# Patient Record
Sex: Female | Born: 1975 | Race: White | Hispanic: No | Smoking: Current every day smoker
Health system: Southern US, Community
[De-identification: ages and names within clinical notes are randomized; demographics above are authoritative.]

## PROBLEM LIST (undated history)

## (undated) ENCOUNTER — Emergency Department (HOSPITAL_COMMUNITY): Admission: EM | Payer: Medicaid Other | Source: Home / Self Care

## (undated) DIAGNOSIS — N200 Calculus of kidney: Secondary | ICD-10-CM

## (undated) DIAGNOSIS — R6882 Decreased libido: Secondary | ICD-10-CM

## (undated) DIAGNOSIS — R058 Other specified cough: Secondary | ICD-10-CM

## (undated) DIAGNOSIS — E785 Hyperlipidemia, unspecified: Secondary | ICD-10-CM

## (undated) DIAGNOSIS — N289 Disorder of kidney and ureter, unspecified: Secondary | ICD-10-CM

## (undated) DIAGNOSIS — M1990 Unspecified osteoarthritis, unspecified site: Secondary | ICD-10-CM

## (undated) DIAGNOSIS — J189 Pneumonia, unspecified organism: Secondary | ICD-10-CM

## (undated) DIAGNOSIS — R232 Flushing: Secondary | ICD-10-CM

## (undated) DIAGNOSIS — F419 Anxiety disorder, unspecified: Secondary | ICD-10-CM

## (undated) DIAGNOSIS — R05 Cough: Secondary | ICD-10-CM

## (undated) DIAGNOSIS — J45909 Unspecified asthma, uncomplicated: Secondary | ICD-10-CM

## (undated) DIAGNOSIS — E119 Type 2 diabetes mellitus without complications: Secondary | ICD-10-CM

## (undated) DIAGNOSIS — Z87442 Personal history of urinary calculi: Secondary | ICD-10-CM

## (undated) DIAGNOSIS — M797 Fibromyalgia: Secondary | ICD-10-CM

## (undated) DIAGNOSIS — K219 Gastro-esophageal reflux disease without esophagitis: Secondary | ICD-10-CM

## (undated) DIAGNOSIS — J449 Chronic obstructive pulmonary disease, unspecified: Secondary | ICD-10-CM

## (undated) DIAGNOSIS — I499 Cardiac arrhythmia, unspecified: Secondary | ICD-10-CM

## (undated) DIAGNOSIS — F32A Depression, unspecified: Secondary | ICD-10-CM

## (undated) DIAGNOSIS — E236 Other disorders of pituitary gland: Secondary | ICD-10-CM

## (undated) DIAGNOSIS — G5603 Carpal tunnel syndrome, bilateral upper limbs: Secondary | ICD-10-CM

## (undated) DIAGNOSIS — D649 Anemia, unspecified: Secondary | ICD-10-CM

## (undated) DIAGNOSIS — E559 Vitamin D deficiency, unspecified: Secondary | ICD-10-CM

## (undated) HISTORY — DX: Flushing: R23.2

## (undated) HISTORY — DX: Other disorders of pituitary gland: E23.6

## (undated) HISTORY — DX: Type 2 diabetes mellitus without complications: E11.9

## (undated) HISTORY — DX: Unspecified osteoarthritis, unspecified site: M19.90

## (undated) HISTORY — DX: Vitamin D deficiency, unspecified: E55.9

## (undated) HISTORY — DX: Carpal tunnel syndrome, bilateral upper limbs: G56.03

## (undated) HISTORY — PX: BREAST CYST EXCISION: SHX579

## (undated) HISTORY — DX: Hyperlipidemia, unspecified: E78.5

## (undated) HISTORY — PX: TONSILLECTOMY: SUR1361

## (undated) HISTORY — PX: ABDOMINAL HYSTERECTOMY: SHX81

## (undated) HISTORY — DX: Decreased libido: R68.82

## (undated) HISTORY — PX: CHOLECYSTECTOMY: SHX55

## (undated) HISTORY — PX: OTHER SURGICAL HISTORY: SHX169

---

## 2000-09-18 HISTORY — PX: LAPAROSCOPIC CHOLECYSTECTOMY: SUR755

## 2001-05-08 ENCOUNTER — Ambulatory Visit (HOSPITAL_COMMUNITY): Admission: RE | Admit: 2001-05-08 | Discharge: 2001-05-08 | Payer: Self-pay | Admitting: Family Medicine

## 2001-05-08 ENCOUNTER — Encounter: Payer: Self-pay | Admitting: Family Medicine

## 2002-06-03 ENCOUNTER — Encounter: Payer: Self-pay | Admitting: Family Medicine

## 2002-06-03 ENCOUNTER — Ambulatory Visit (HOSPITAL_COMMUNITY): Admission: RE | Admit: 2002-06-03 | Discharge: 2002-06-03 | Payer: Self-pay | Admitting: Family Medicine

## 2002-11-25 ENCOUNTER — Ambulatory Visit (HOSPITAL_COMMUNITY): Admission: RE | Admit: 2002-11-25 | Discharge: 2002-11-25 | Payer: Self-pay | Admitting: Family Medicine

## 2002-11-25 ENCOUNTER — Encounter: Payer: Self-pay | Admitting: Family Medicine

## 2004-04-14 ENCOUNTER — Ambulatory Visit (HOSPITAL_COMMUNITY): Admission: RE | Admit: 2004-04-14 | Discharge: 2004-04-14 | Payer: Self-pay | Admitting: *Deleted

## 2004-06-02 ENCOUNTER — Ambulatory Visit (HOSPITAL_COMMUNITY): Admission: RE | Admit: 2004-06-02 | Discharge: 2004-06-02 | Payer: Self-pay | Admitting: *Deleted

## 2005-01-06 ENCOUNTER — Ambulatory Visit: Payer: Self-pay | Admitting: *Deleted

## 2005-05-18 ENCOUNTER — Emergency Department (HOSPITAL_COMMUNITY): Admission: EM | Admit: 2005-05-18 | Discharge: 2005-05-19 | Payer: Self-pay | Admitting: *Deleted

## 2006-06-21 ENCOUNTER — Emergency Department (HOSPITAL_COMMUNITY): Admission: EM | Admit: 2006-06-21 | Discharge: 2006-06-21 | Payer: Self-pay | Admitting: Emergency Medicine

## 2007-01-05 ENCOUNTER — Emergency Department (HOSPITAL_COMMUNITY): Admission: EM | Admit: 2007-01-05 | Discharge: 2007-01-05 | Payer: Self-pay | Admitting: Emergency Medicine

## 2007-05-02 ENCOUNTER — Ambulatory Visit (HOSPITAL_COMMUNITY): Admission: RE | Admit: 2007-05-02 | Discharge: 2007-05-02 | Payer: Self-pay | Admitting: Family Medicine

## 2007-09-23 ENCOUNTER — Emergency Department (HOSPITAL_COMMUNITY): Admission: EM | Admit: 2007-09-23 | Discharge: 2007-09-23 | Payer: Self-pay | Admitting: Emergency Medicine

## 2007-12-23 ENCOUNTER — Other Ambulatory Visit: Admission: RE | Admit: 2007-12-23 | Discharge: 2007-12-23 | Payer: Self-pay | Admitting: Obstetrics and Gynecology

## 2007-12-25 ENCOUNTER — Ambulatory Visit (HOSPITAL_COMMUNITY): Admission: RE | Admit: 2007-12-25 | Discharge: 2007-12-25 | Payer: Self-pay | Admitting: Obstetrics and Gynecology

## 2008-01-23 ENCOUNTER — Ambulatory Visit (HOSPITAL_COMMUNITY): Admission: RE | Admit: 2008-01-23 | Discharge: 2008-01-23 | Payer: Self-pay | Admitting: Obstetrics & Gynecology

## 2008-04-18 ENCOUNTER — Emergency Department (HOSPITAL_COMMUNITY): Admission: EM | Admit: 2008-04-18 | Discharge: 2008-04-18 | Payer: Self-pay | Admitting: Emergency Medicine

## 2008-05-17 ENCOUNTER — Emergency Department (HOSPITAL_COMMUNITY): Admission: EM | Admit: 2008-05-17 | Discharge: 2008-05-17 | Payer: Self-pay | Admitting: Emergency Medicine

## 2008-07-09 ENCOUNTER — Emergency Department (HOSPITAL_COMMUNITY): Admission: EM | Admit: 2008-07-09 | Discharge: 2008-07-09 | Payer: Self-pay | Admitting: Emergency Medicine

## 2008-12-26 ENCOUNTER — Emergency Department (HOSPITAL_COMMUNITY): Admission: EM | Admit: 2008-12-26 | Discharge: 2008-12-26 | Payer: Self-pay | Admitting: Emergency Medicine

## 2009-02-17 ENCOUNTER — Other Ambulatory Visit: Admission: RE | Admit: 2009-02-17 | Discharge: 2009-02-17 | Payer: Self-pay | Admitting: Obstetrics and Gynecology

## 2009-02-18 ENCOUNTER — Ambulatory Visit (HOSPITAL_COMMUNITY): Admission: RE | Admit: 2009-02-18 | Discharge: 2009-02-18 | Payer: Self-pay | Admitting: Obstetrics & Gynecology

## 2009-03-10 ENCOUNTER — Encounter (INDEPENDENT_AMBULATORY_CARE_PROVIDER_SITE_OTHER): Payer: Self-pay | Admitting: Diagnostic Radiology

## 2009-03-10 ENCOUNTER — Ambulatory Visit (HOSPITAL_COMMUNITY): Admission: RE | Admit: 2009-03-10 | Discharge: 2009-03-10 | Payer: Self-pay | Admitting: Obstetrics & Gynecology

## 2009-06-12 ENCOUNTER — Emergency Department (HOSPITAL_COMMUNITY): Admission: EM | Admit: 2009-06-12 | Discharge: 2009-06-12 | Payer: Self-pay | Admitting: Emergency Medicine

## 2010-01-25 ENCOUNTER — Emergency Department (HOSPITAL_COMMUNITY): Admission: EM | Admit: 2010-01-25 | Discharge: 2010-01-25 | Payer: Self-pay | Admitting: Emergency Medicine

## 2010-03-21 ENCOUNTER — Emergency Department (HOSPITAL_COMMUNITY): Admission: EM | Admit: 2010-03-21 | Discharge: 2010-03-22 | Payer: Self-pay | Admitting: Emergency Medicine

## 2010-05-23 ENCOUNTER — Emergency Department (HOSPITAL_COMMUNITY): Admission: EM | Admit: 2010-05-23 | Discharge: 2010-05-23 | Payer: Self-pay | Admitting: Emergency Medicine

## 2010-06-27 ENCOUNTER — Ambulatory Visit (HOSPITAL_COMMUNITY): Admission: RE | Admit: 2010-06-27 | Discharge: 2010-06-27 | Payer: Self-pay | Admitting: Family Medicine

## 2010-07-07 ENCOUNTER — Encounter (HOSPITAL_COMMUNITY): Admission: RE | Admit: 2010-07-07 | Discharge: 2010-07-08 | Payer: Self-pay | Admitting: Family Medicine

## 2010-08-08 ENCOUNTER — Encounter (INDEPENDENT_AMBULATORY_CARE_PROVIDER_SITE_OTHER): Payer: Self-pay | Admitting: General Surgery

## 2010-08-08 ENCOUNTER — Inpatient Hospital Stay (HOSPITAL_COMMUNITY): Admission: RE | Admit: 2010-08-08 | Discharge: 2010-08-09 | Payer: Self-pay | Admitting: General Surgery

## 2010-09-18 HISTORY — PX: THYROIDECTOMY, PARTIAL: SHX18

## 2010-10-10 ENCOUNTER — Encounter: Payer: Self-pay | Admitting: Obstetrics & Gynecology

## 2010-11-25 NOTE — Discharge Summary (Signed)
  NAME:  Cassandra Lucas, Cassandra Lucas NO.:  [id number]  MEDICAL RECORD NO.:  [id number]          PATIENT TYPE:  INP  LOCATION:  A302                          FACILITY:  [facility]  PHYSICIAN:  Tilford Pillar, MD      DATE OF BIRTH:  [birth year]  DATE OF ADMISSION:  08/08/2010 DATE OF DISCHARGE:  11/22/2011LH                              DISCHARGE SUMMARY   ADMISSION DIAGNOSES:  Thyroid cystic mass and compressing symptomatology.  DISCHARGE DIAGNOSIS:  Status post thyroid lobectomy.  DISPOSITION:  Home.  BRIEF HISTORY AND PHYSICAL:  Please see admission history and physical for the complete H and P.  The patient is a 35 year old female who presents to my office with increasing discomfort and compressive symptomatology from a growing thyroid lesion.  She had an ultrasound which did demonstrate a cystic structure within this, although less suspicion of the malignant process.  It was advised that she undergo thyroid lobectomy due to her compressive symptomatology.  Risks, benefits and alternatives were discussed with the patient.  She was admitted for the planned procedure and management.  Also of course, the patient was admitted on August 08, 2010.  She was taken to the operating room on the same day.  She tolerated the procedure extremely well, spent a brief period in the Postanesthetic Care Unit and then was transferred back to regular hospital floor.  She was monitored closely. On the morning, her symptoms were improved.  She was tolerating oral analgesia and the patient was discharged to home on August 09, 2010.  DISCHARGE INSTRUCTIONS:  She was instructed to increase activity as tolerated.  She had resumed a normal diet.  She may shower.  She is postponing the decisions for the next 2-3 weeks.  She is to call my office should she have any questions or concerns.  She is to follow up in my office in 2 weeks.  DISCHARGE MEDICATIONS:  Please see the discharge  medication reconciliation sheet for all discharge medications.     Tilford Pillar, MD     BZ/MEDQ  D:  09/21/2010  T:  09/22/2010  Job:  [id number]  Electronically Signed by Tilford Pillar MD on 11/24/2010 08:52:15 PM

## 2010-11-29 LAB — BASIC METABOLIC PANEL WITH GFR
BUN: 11 mg/dL (ref 6–23)
CO2: 26 meq/L (ref 19–32)
Calcium: 9.1 mg/dL (ref 8.4–10.5)
Chloride: 105 meq/L (ref 96–112)
Creatinine, Ser: 0.65 mg/dL (ref 0.4–1.2)
GFR calc non Af Amer: >60 mL/min
Glucose, Bld: 86 mg/dL (ref 70–99)
Potassium: 4.3 meq/L (ref 3.5–5.1)
Sodium: 137 meq/L (ref 135–145)

## 2010-11-29 LAB — CBC
Hemoglobin: 13 g/dL (ref 12.0–15.0)
Hemoglobin: 14.3 g/dL (ref 12.0–15.0)
MCH: 31.8 pg (ref 26.0–34.0)
MCH: 32.1 pg (ref 26.0–34.0)
MCV: 91.6 fL (ref 78.0–100.0)
MCV: 92.4 fL (ref 78.0–100.0)
RBC: 4.05 MIL/uL (ref 3.87–5.11)
RBC: 4.51 MIL/uL (ref 3.87–5.11)

## 2010-11-29 LAB — BASIC METABOLIC PANEL
CO2: 27 mEq/L (ref 19–32)
Chloride: 106 mEq/L (ref 96–112)
GFR calc Af Amer: >60 mL/min (ref >60)
Sodium: 139 mEq/L (ref 135–145)

## 2010-11-29 LAB — DIFFERENTIAL
Eosinophils Absolute: 0.1 10*3/uL (ref 0.0–0.7)
Eosinophils Relative: 1 % (ref 0–5)
Lymphs Abs: 3.1 10*3/uL (ref 0.7–4.0)
Monocytes Relative: 8 % (ref 3–12)
Neutrophils Relative %: 70 % (ref 43–77)

## 2010-12-01 LAB — URINE MICROSCOPIC-ADD ON

## 2010-12-01 LAB — URINALYSIS, ROUTINE W REFLEX MICROSCOPIC: Glucose, UA: NEGATIVE mg/dL

## 2010-12-01 LAB — PREGNANCY, URINE: Preg Test, Ur: NEGATIVE

## 2010-12-28 LAB — URINALYSIS, ROUTINE W REFLEX MICROSCOPIC
Bilirubin Urine: NEGATIVE
Ketones, ur: NEGATIVE mg/dL
Nitrite: NEGATIVE
Protein, ur: NEGATIVE mg/dL
Specific Gravity, Urine: 1.025 (ref 1.005–1.030)
Urobilinogen, UA: 0.2 mg/dL (ref 0.0–1.0)

## 2010-12-28 LAB — URINE MICROSCOPIC-ADD ON

## 2011-03-08 ENCOUNTER — Emergency Department (HOSPITAL_COMMUNITY): Payer: Medicaid Other

## 2011-03-08 ENCOUNTER — Emergency Department (HOSPITAL_COMMUNITY)
Admission: EM | Admit: 2011-03-08 | Discharge: 2011-03-08 | Disposition: A | Discharge status: Home / Self Care | Payer: Medicaid Other | Attending: Emergency Medicine | Admitting: Emergency Medicine

## 2011-03-08 DIAGNOSIS — IMO0002 Reserved for concepts with insufficient information to code with codable children: Secondary | ICD-10-CM | POA: Insufficient documentation

## 2011-03-08 DIAGNOSIS — Y92009 Unspecified place in unspecified non-institutional (private) residence as the place of occurrence of the external cause: Secondary | ICD-10-CM | POA: Insufficient documentation

## 2011-03-08 DIAGNOSIS — S139XXA Sprain of joints and ligaments of unspecified parts of neck, initial encounter: Secondary | ICD-10-CM | POA: Insufficient documentation

## 2011-03-23 ENCOUNTER — Inpatient Hospital Stay (HOSPITAL_COMMUNITY): Admission: RE | Admit: 2011-03-23 | Payer: Self-pay | Source: Ambulatory Visit

## 2011-03-23 ENCOUNTER — Other Ambulatory Visit: Payer: Self-pay | Admitting: Obstetrics & Gynecology

## 2011-03-24 ENCOUNTER — Encounter (HOSPITAL_COMMUNITY): Payer: Self-pay

## 2011-03-24 ENCOUNTER — Encounter (HOSPITAL_COMMUNITY): Admission: RE | Admit: 2011-03-24 | Payer: Self-pay | Source: Home / Self Care | Admitting: Obstetrics & Gynecology

## 2011-03-24 ENCOUNTER — Ambulatory Visit (HOSPITAL_COMMUNITY)
Admission: RE | Admit: 2011-03-24 | Discharge: 2011-03-24 | Disposition: A | Discharge status: Home / Self Care | Payer: Medicaid Other | Source: Ambulatory Visit | Attending: Obstetrics & Gynecology | Admitting: Obstetrics & Gynecology

## 2011-03-24 DIAGNOSIS — R52 Pain, unspecified: Secondary | ICD-10-CM

## 2011-03-24 DIAGNOSIS — R109 Unspecified abdominal pain: Secondary | ICD-10-CM | POA: Insufficient documentation

## 2011-03-24 DIAGNOSIS — N920 Excessive and frequent menstruation with regular cycle: Secondary | ICD-10-CM | POA: Insufficient documentation

## 2011-03-24 DIAGNOSIS — Z5309 Procedure and treatment not carried out because of other contraindication: Secondary | ICD-10-CM | POA: Insufficient documentation

## 2011-03-24 DIAGNOSIS — Z01812 Encounter for preprocedural laboratory examination: Secondary | ICD-10-CM | POA: Insufficient documentation

## 2011-03-24 DIAGNOSIS — N200 Calculus of kidney: Secondary | ICD-10-CM

## 2011-03-24 LAB — URINE MICROSCOPIC-ADD ON

## 2011-03-24 LAB — URINALYSIS, ROUTINE W REFLEX MICROSCOPIC
Ketones, ur: NEGATIVE mg/dL
Leukocytes, UA: NEGATIVE
Nitrite: NEGATIVE
Protein, ur: NEGATIVE mg/dL

## 2011-03-24 LAB — CBC
HCT: 42 % (ref 36.0–46.0)
Hemoglobin: 14.4 g/dL (ref 12.0–15.0)
MCV: 91.9 fL (ref 78.0–100.0)
RBC: 4.57 MIL/uL (ref 3.87–5.11)
WBC: 9.6 10*3/uL (ref 4.0–10.5)

## 2011-03-24 LAB — BASIC METABOLIC PANEL
CO2: 25 mEq/L (ref 19–32)
Chloride: 105 mEq/L (ref 96–112)
Creatinine, Ser: 0.96 mg/dL (ref 0.50–1.10)
Sodium: 140 mEq/L (ref 135–145)

## 2011-03-24 LAB — TYPE AND SCREEN

## 2011-03-24 NOTE — Patient Instructions (Signed)
20 Cassandra Lucas  03/24/2011   Your procedure is scheduled on:  Wednesday, 03/29/11  Report to [facility] at 0900 AM.  Call this number if you have problems the morning of surgery: [phone]   Remember:   Do not eat food:After Midnight.  Do not drink clear liquids: After Midnight.  Take these medicines the morning of surgery with A SIP OF WATER: nothing   Do not wear jewelry, make-up or nail polish.  Do not bring valuables to the hospital.  Contacts, dentures or bridgework may not be worn into surgery.  Leave suitcase in the car. After surgery it may be brought to your room.  For patients admitted to the hospital, checkout time is 11:00 AM the day of discharge.   Patients discharged the day of surgery will not be allowed to drive home.  Name and phone number of your driver: grandma  Special Instructions: CHG Shower Shower 2 days before surgery and 1 day before surgery with Hibiclens.   Please read over the following fact sheets that you were given: Pain Booklet, Coughing and Deep Breathing, MRSA Information, Surgical Site Infection Prevention and Anesthesia Post-op Instructions   Instructions Following General Anesthetic, Adult A nurse specialized in giving anesthesia (anesthetist) or a doctor specialized in giving anesthesia (anesthesiologist) gave you a medicine that made you sleep while a procedure was performed. For as long as 24 hours following this procedure, you may feel:  Dizzy.   Weak.   Drowsy.  AFTER YOUR SURGERY 1. After surgery, you will be taken to the recovery area where a nurse will monitor your progress. You will be allowed to go home when you are awake, stable, taking fluids well, and without complications.  2.  3. For the first 24 hours following an anesthetic:   Have a responsible person with you.   Do not drive a car. If you are alone, do not take public transportation.   Do not drink alcohol.   Do not take medicine that has not been prescribed by  your caregiver.   Do not sign important papers or make important decisions.   You may resume normal diet and activities as directed.   Change bandages (dressings) as directed.   Only take over-the-counter or prescription medicines for pain, discomfort, or fever as directed by your caregiver.  If you have questions or problems that seem related to the anesthetic, call the hospital and ask for the anesthetist or anesthesiologist on call. SEEK IMMEDIATE MEDICAL CARE IF:  You develop a rash.   You have difficulty breathing.   You have chest pain.   You develop any allergic problems.  Document Released: 12/11/2000 Document Re-Released: 11/29/2009 [facility] Patient Information 2011 [place], [place].

## 2011-03-29 ENCOUNTER — Other Ambulatory Visit: Payer: Self-pay | Admitting: Obstetrics & Gynecology

## 2011-03-29 ENCOUNTER — Ambulatory Visit (HOSPITAL_COMMUNITY)
Admission: RE | Admit: 2011-03-29 | Discharge: 2011-03-29 | Disposition: A | Discharge status: Home / Self Care | Payer: Medicaid Other | Source: Ambulatory Visit | Attending: Obstetrics & Gynecology | Admitting: Obstetrics & Gynecology

## 2011-03-29 DIAGNOSIS — N200 Calculus of kidney: Secondary | ICD-10-CM

## 2011-03-29 DIAGNOSIS — R9389 Abnormal findings on diagnostic imaging of other specified body structures: Secondary | ICD-10-CM | POA: Insufficient documentation

## 2011-03-29 DIAGNOSIS — R1031 Right lower quadrant pain: Secondary | ICD-10-CM | POA: Insufficient documentation

## 2011-03-29 DIAGNOSIS — R52 Pain, unspecified: Secondary | ICD-10-CM

## 2011-03-29 HISTORY — DX: Other specified cough: R05.8

## 2011-03-29 HISTORY — DX: Cough: R05

## 2011-03-29 HISTORY — DX: Anxiety disorder, unspecified: F41.9

## 2011-04-05 ENCOUNTER — Other Ambulatory Visit: Payer: Self-pay | Admitting: Obstetrics & Gynecology

## 2011-04-11 ENCOUNTER — Encounter (HOSPITAL_COMMUNITY): Payer: Self-pay | Admitting: Obstetrics & Gynecology

## 2011-04-11 DIAGNOSIS — N200 Calculus of kidney: Secondary | ICD-10-CM | POA: Insufficient documentation

## 2011-04-11 DIAGNOSIS — IMO0001 Reserved for inherently not codable concepts without codable children: Secondary | ICD-10-CM | POA: Insufficient documentation

## 2011-04-11 DIAGNOSIS — N921 Excessive and frequent menstruation with irregular cycle: Secondary | ICD-10-CM | POA: Insufficient documentation

## 2011-04-11 NOTE — H&P (Signed)
Preoperative History and Physical  Cassandra Lucas is a 35 y.o. G4P0004 with No LMP recorded. admitted for a laparoscopic total hysterectomy.  The patient has been seen now for greater than 3 years by our practice and her symptoms predated her visits in 2009.  The patient has had consistent problem with bump dyspareunia, menometrorrhagia, and dysmenorrhea despite management with OCP and Megestrol.  She has no lateral pain at all, 100% is in the midline.  Her periods last 8-9 days with clots and heavy cramping which significantly disrupts the patients life.  Again her pain with intercourse is all midline.  I performed an ultrasound in the office which reveals adenomyosis in the uterus, normal ovaries. After trying conservative options for the past 3 years we have decided to proceed with laparoscopic total hysterectomy and preserve the ovaries if no indication for removal.  The patient has had a recurrent bout with a renal/ureteral stone recently but has decided to proceed at this point with surgery.  PMH:    Past Medical History  Diagnosis Date  . Productive cough     smoker  . Anxiety     PSH:     Past Surgical History  Procedure Date  . Thyroidectomy, partial 2012    [facility], [facility]  . Laparoscopic cholecystectomy 2002    [facility]  . Breast cyst excision   . Cesarean section 2006    [facility]    POb/GynH:      OB History    Grav Para Term Preterm Abortions TAB SAB Ect Mult Living   4 4 0 0 0 0 0 0 0 4       SH:   History  Substance Use Topics  . Smoking status: Current Everyday Smoker -- 1.0 packs/day for 17 years    Types: Cigarettes  . Smokeless tobacco: Not on file  . Alcohol Use: No    FH:   History reviewed. No pertinent family history.   Allergies:  Allergies  Allergen Reactions  . Nsaids Shortness Of Breath  . Aspirin Other (See Comments)    unknown    Medications:      No current facility-administered medications for this encounter. Current outpatient  prescriptions:cyclobenzaprine (FLEXERIL) 10 MG tablet, Take 10 mg by mouth 3 (three) times daily as needed.  , Disp: , Rfl: ;  megestrol (MEGACE) 40 MG tablet, Take 40 mg by mouth daily.  , Disp: , Rfl:   Review of Systems: - History obtained from the patient General ROS: positive for  - left flank pain Respiratory ROS: no cough, shortness of breath, or wheezing Cardiovascular ROS: no chest pain or dyspnea on exertion Gastrointestinal ROS: no abdominal pain, change in bowel habits, or black or bloody stools Genito-Urinary ROS: positive for - dysmenorrhea, dyspareunia, irregular/heavy menses, pelvic pain(midline, no lateral pain) Musculoskeletal ROS: negative Neurological ROS: negative  PHYSICAL EXAM:  There were no vitals taken for this visit.  General: General appearance - alert, well appearing, and in no distress Neck - supple, no significant adenopathy Chest - clear to auscultation, no wheezes, rales or rhonchi, symmetric air entry Heart - normal rate, regular rhythm, normal S1, S2, no murmurs, rubs, clicks or gallops Abdomen - soft, nontender, nondistended, no masses or organomegaly Breasts - breasts appear normal, no suspicious masses, no skin or nipple changes or axillary nodes Back exam - full range of motion, palpable spasm or pain on motion, tenderness noted Left flank Focused Gynecological Exam: VULVA: normal appearing vulva with no masses, tenderness or lesions,  VAGINA: normal appearing vagina with normal color and discharge, no lesions, CERVIX: normal appearing cervix without discharge or lesions, there is pain with anterior or posterior motion of cervix, not as if infectious, but bump dyspareunia, UTERUS: uterus is normal size, shape, consistency and, soft with  tenderness on palpation consistent with adenomyosis, anteverted, ADNEXA: normal adnexa in size, nontender and no masses, no masses, RECTAL: normal rectal, no masses, no rectocele, PAP: Pap smear normal, 02/2009, thin-prep  method  Labs: No results found for this or any previous visit (from the past 336 hour(s)).  EKG: No orders found for this or any previous visit.  Imaging Studies: US Renal  03/29/2011  *RADIOLOGY REPORT*  Clinical Data: Right flank pain, history kidney stones  RENAL/URINARY TRACT ULTRASOUND COMPLETE  Comparison:  CT abdomen 05/17/2008  Findings:  Right Kidney:  10.4 cm length.  Minimal cortical thinning.  Normal cortical echogenicity.  No mass, hydronephrosis shadowing calcification.  Left Kidney:  11.8 cm length.  Normal cortical echogenicity. Minimal cortical thinning.  Several echogenic nonshadowing foci within left kidney, cannot completely exclude renal calculi.  No definite mass or hydronephrosis.  Bladder:  Only partially distended, grossly normal morphology. Left ureteral jet visualized.  Right ureteral jet is not identified.  IMPRESSION: Question nonobstructing left renal calculi as cause of the nonshadowing J foci at left kidney. No acute right renal abnormalities are sonographically identified, though a right ureteral jet was not visualized during imaging.  Original Report Authenticated By: Lollie Marrow, M.D.      Assessment: Patient Active Problem List  Diagnoses  . Kidney stones  . Menometrorrhagia  . Dyspareunia (not due to a general medical condition)  4.  Adenomyosis 5.. Exhaustion of conservative management  Plan: Laparoscopic total hysterectomy.  Pt understands the risks of surgery including but not limited t  excessive bleeding requiring transfusion or reoperation, post-operative infection requiring prolonged hospitalization or re-hospitalization and antibiotic therapy, and damage to other organs including bladder, bowel, ureters and major vessels.  Lazaro Arms 04/11/2011 9:13 PM

## 2011-04-12 ENCOUNTER — Other Ambulatory Visit: Payer: Self-pay | Admitting: Obstetrics & Gynecology

## 2011-04-12 ENCOUNTER — Ambulatory Visit (HOSPITAL_COMMUNITY): Payer: Medicaid Other | Admitting: Anesthesiology

## 2011-04-12 ENCOUNTER — Ambulatory Visit (HOSPITAL_COMMUNITY)
Admission: RE | Admit: 2011-04-12 | Discharge: 2011-04-12 | Disposition: A | Discharge status: Home / Self Care | Payer: Medicaid Other | Source: Ambulatory Visit | Attending: Obstetrics & Gynecology | Admitting: Obstetrics & Gynecology

## 2011-04-12 ENCOUNTER — Encounter (HOSPITAL_COMMUNITY): Payer: Self-pay | Admitting: Anesthesiology

## 2011-04-12 ENCOUNTER — Encounter (HOSPITAL_COMMUNITY)
Admission: RE | Disposition: A | Discharge status: Home / Self Care | Payer: Self-pay | Source: Ambulatory Visit | Attending: Obstetrics & Gynecology

## 2011-04-12 ENCOUNTER — Encounter (HOSPITAL_COMMUNITY): Payer: Self-pay | Admitting: *Deleted

## 2011-04-12 DIAGNOSIS — N920 Excessive and frequent menstruation with regular cycle: Secondary | ICD-10-CM | POA: Insufficient documentation

## 2011-04-12 DIAGNOSIS — Z01812 Encounter for preprocedural laboratory examination: Secondary | ICD-10-CM | POA: Insufficient documentation

## 2011-04-12 DIAGNOSIS — Z90710 Acquired absence of both cervix and uterus: Secondary | ICD-10-CM

## 2011-04-12 DIAGNOSIS — IMO0002 Reserved for concepts with insufficient information to code with codable children: Secondary | ICD-10-CM | POA: Insufficient documentation

## 2011-04-12 DIAGNOSIS — N8000 Endometriosis of the uterus, unspecified: Secondary | ICD-10-CM | POA: Insufficient documentation

## 2011-04-12 DIAGNOSIS — N946 Dysmenorrhea, unspecified: Secondary | ICD-10-CM | POA: Insufficient documentation

## 2011-04-12 HISTORY — DX: Anemia, unspecified: D64.9

## 2011-04-12 HISTORY — PX: LAPAROSCOPIC HYSTERECTOMY: SHX1926

## 2011-04-12 SURGERY — HYSTERECTOMY, TOTAL, LAPAROSCOPIC
Anesthesia: General | Wound class: Clean Contaminated

## 2011-04-12 MED ORDER — OXYCODONE-ACETAMINOPHEN 7.5-500 MG PO TABS: 1.0000 | ORAL_TABLET | Freq: Four times a day (QID) | ORAL | Status: AC | PRN

## 2011-04-12 MED ORDER — LACTATED RINGERS IV SOLN
INTRAVENOUS | Status: DC
  Administered 2011-04-12 (×3): via INTRAVENOUS
  Administered 2011-04-12: 1000 mL via INTRAVENOUS

## 2011-04-12 MED ORDER — ONDANSETRON HCL 4 MG/2ML IJ SOLN: 4.0000 mg | Freq: Once | INTRAMUSCULAR | Status: DC | PRN

## 2011-04-12 MED ORDER — ACETAMINOPHEN 10 MG/ML IV SOLN: 1000.0000 mg | INTRAVENOUS | Status: DC

## 2011-04-12 MED ORDER — ROCURONIUM BROMIDE 100 MG/10ML IV SOLN
INTRAVENOUS | Status: DC | PRN
  Administered 2011-04-12: 5 mg via INTRAVENOUS
  Administered 2011-04-12: 25 mg via INTRAVENOUS
  Administered 2011-04-12 (×2): 10 mg via INTRAVENOUS

## 2011-04-12 MED ORDER — MIDAZOLAM HCL 2 MG/2ML IJ SOLN
INTRAMUSCULAR | Status: AC
  Administered 2011-04-12: 2 mg via INTRAVENOUS
  Filled 2011-04-12: qty 2

## 2011-04-12 MED ORDER — ONDANSETRON HCL 4 MG/2ML IJ SOLN
INTRAMUSCULAR | Status: AC
  Administered 2011-04-12: 4 mg via INTRAVENOUS
  Filled 2011-04-12: qty 2

## 2011-04-12 MED ORDER — ONDANSETRON HCL 4 MG/2ML IJ SOLN
4.0000 mg | Freq: Once | INTRAMUSCULAR | Status: AC
  Administered 2011-04-12: 4 mg via INTRAVENOUS

## 2011-04-12 MED ORDER — FENTANYL CITRATE 0.05 MG/ML IJ SOLN
INTRAMUSCULAR | Status: AC
  Administered 2011-04-12: 50 ug via INTRAVENOUS
  Filled 2011-04-12: qty 2

## 2011-04-12 MED ORDER — HYDROMORPHONE HCL 1 MG/ML IJ SOLN: 0.2500 mg | INTRAMUSCULAR | Status: DC | PRN

## 2011-04-12 MED ORDER — NEOSTIGMINE METHYLSULFATE 1 MG/ML IJ SOLN
INTRAMUSCULAR | Status: DC | PRN
  Administered 2011-04-12: 2 mg via INTRAMUSCULAR

## 2011-04-12 MED ORDER — ONDANSETRON HCL 8 MG PO TABS: 8.0000 mg | ORAL_TABLET | Freq: Three times a day (TID) | ORAL | Status: AC | PRN

## 2011-04-12 MED ORDER — FENTANYL CITRATE 0.05 MG/ML IJ SOLN
INTRAMUSCULAR | Status: DC | PRN
  Administered 2011-04-12 (×2): 50 ug via INTRAVENOUS
  Administered 2011-04-12: 100 ug via INTRAVENOUS
  Administered 2011-04-12 (×3): 50 ug via INTRAVENOUS

## 2011-04-12 MED ORDER — ACETAMINOPHEN 10 MG/ML IV SOLN
1000.0000 mg | INTRAVENOUS | Status: AC
  Administered 2011-04-12: 1000 mg via INTRAVENOUS

## 2011-04-12 MED ORDER — SODIUM CHLORIDE 0.9 % IR SOLN
Status: DC | PRN
  Administered 2011-04-12: 1000 mL

## 2011-04-12 MED ORDER — ZOLPIDEM TARTRATE 10 MG PO TABS: 10.0000 mg | ORAL_TABLET | Freq: Every evening | ORAL | Status: AC | PRN

## 2011-04-12 MED ORDER — CEFAZOLIN SODIUM 1-5 GM-% IV SOLN
1.0000 g | INTRAVENOUS | Status: AC
  Administered 2011-04-12: 1 g via INTRAVENOUS

## 2011-04-12 MED ORDER — ROCURONIUM BROMIDE 50 MG/5ML IV SOLN
INTRAVENOUS | Status: AC
  Filled 2011-04-12: qty 1

## 2011-04-12 MED ORDER — GLYCOPYRROLATE 0.2 MG/ML IJ SOLN
INTRAMUSCULAR | Status: DC | PRN
  Administered 2011-04-12: .4 mg via INTRAVENOUS

## 2011-04-12 MED ORDER — MIDAZOLAM HCL 2 MG/2ML IJ SOLN
INTRAMUSCULAR | Status: AC
  Filled 2011-04-12: qty 2

## 2011-04-12 MED ORDER — MIDAZOLAM HCL 2 MG/2ML IJ SOLN
1.0000 mg | INTRAMUSCULAR | Status: DC | PRN
  Administered 2011-04-12 (×2): 2 mg via INTRAVENOUS

## 2011-04-12 MED ORDER — GLYCOPYRROLATE 0.2 MG/ML IJ SOLN
0.2000 mg | Freq: Once | INTRAMUSCULAR | Status: AC
  Administered 2011-04-12: 0.2 mg via INTRAVENOUS

## 2011-04-12 MED ORDER — LIDOCAINE HCL (PF) 1 % IJ SOLN
INTRAMUSCULAR | Status: AC
  Filled 2011-04-12: qty 5

## 2011-04-12 MED ORDER — PROPOFOL 10 MG/ML IV EMUL
INTRAVENOUS | Status: AC
  Filled 2011-04-12: qty 20

## 2011-04-12 MED ORDER — MIDAZOLAM HCL 2 MG/2ML IJ SOLN
2.0000 mg | Freq: Once | INTRAMUSCULAR | Status: AC
  Administered 2011-04-12: 2 mg via INTRAVENOUS

## 2011-04-12 MED ORDER — ACETAMINOPHEN 10 MG/ML IV SOLN
INTRAVENOUS | Status: AC
  Administered 2011-04-12: 1000 mg via INTRAVENOUS
  Filled 2011-04-12: qty 100

## 2011-04-12 MED ORDER — FENTANYL CITRATE 0.05 MG/ML IJ SOLN
INTRAMUSCULAR | Status: AC
  Filled 2011-04-12: qty 2

## 2011-04-12 MED ORDER — FENTANYL CITRATE 0.05 MG/ML IJ SOLN
INTRAMUSCULAR | Status: AC
  Administered 2011-04-12: 50 ug via INTRAVENOUS
  Filled 2011-04-12: qty 5

## 2011-04-12 MED ORDER — GLYCOPYRROLATE 0.2 MG/ML IJ SOLN
INTRAMUSCULAR | Status: AC
  Filled 2011-04-12: qty 1

## 2011-04-12 MED ORDER — CEFAZOLIN SODIUM 1-5 GM-% IV SOLN
INTRAVENOUS | Status: AC
  Filled 2011-04-12: qty 50

## 2011-04-12 MED ORDER — LIDOCAINE HCL 1 % IJ SOLN
INTRAMUSCULAR | Status: DC | PRN
  Administered 2011-04-12: 10 mg via INTRADERMAL

## 2011-04-12 MED ORDER — FENTANYL CITRATE 0.05 MG/ML IJ SOLN
25.0000 ug | INTRAMUSCULAR | Status: DC | PRN
  Administered 2011-04-12 (×2): 50 ug via INTRAVENOUS

## 2011-04-12 MED ORDER — GLYCOPYRROLATE 0.2 MG/ML IJ SOLN
INTRAMUSCULAR | Status: AC
  Administered 2011-04-12: 0.2 mg via INTRAVENOUS
  Filled 2011-04-12: qty 1

## 2011-04-12 MED ORDER — PROPOFOL 10 MG/ML IV EMUL
INTRAVENOUS | Status: DC | PRN
  Administered 2011-04-12: 150 mg via INTRAVENOUS
  Administered 2011-04-12: 50 mg via INTRAVENOUS

## 2011-04-12 MED ORDER — SODIUM CHLORIDE 0.9 % IR SOLN
Status: DC | PRN
  Administered 2011-04-12: 3000 mL

## 2011-04-12 SURGICAL SUPPLY — 56 items
APPLIER CLIP ROT 10 11.4 M/L (STAPLE)
APPLIER CLIP ROT 13.4 12 LRG (CLIP)
APPLIER CLIP UNV 5X34 EPIX (ENDOMECHANICALS) ×1 IMPLANT
APR CLP LRG 13.4X12 ROT 20 MLT (CLIP)
APR CLP MED LRG 11.4X10 (STAPLE)
APR XCLPCLP 20M/L UNV 34X5 (ENDOMECHANICALS) ×1
BAG HAMPER (MISCELLANEOUS) ×2 IMPLANT
BAG SPEC RTRVL LRG 6X4 10 (ENDOMECHANICALS)
BLADE MORCELLATOR LAPAROSCOPIC (BLADE) ×1 IMPLANT
BLADE SURG SZ11 CARB STEEL (BLADE) ×2 IMPLANT
CLIP APPLIE ROT 10 11.4 M/L (STAPLE) IMPLANT
CLIP APPLIE ROT 13.4 12 LRG (CLIP) IMPLANT
CLOTH BEACON ORANGE TIMEOUT ST (SAFETY) ×2 IMPLANT
COVER LIGHT HANDLE STERIS (MISCELLANEOUS) ×4 IMPLANT
DEVICE SUTURE ENDOST 10MM (ENDOMECHANICALS) ×1 IMPLANT
ELECT REM PT RETURN 9FT ADLT (ELECTROSURGICAL) ×2
ELECTRODE REM PT RTRN 9FT ADLT (ELECTROSURGICAL) ×1 IMPLANT
FILTER SMOKE EVAC LAPAROSHD (FILTER) ×3 IMPLANT
FORMALIN 10 PREFIL 480ML (MISCELLANEOUS) ×2 IMPLANT
GAUZE SPONGE 4X4 16PLY XRAY LF (GAUZE/BANDAGES/DRESSINGS) IMPLANT
GLOVE BIOGEL PI IND STRL 8 (GLOVE) ×1 IMPLANT
GLOVE BIOGEL PI INDICATOR 8 (GLOVE) ×1
GLOVE ECLIPSE 6.5 STRL STRAW (GLOVE) ×1 IMPLANT
GLOVE ECLIPSE 7.0 STRL STRAW (GLOVE) ×1 IMPLANT
GLOVE ECLIPSE 8.0 STRL XLNG CF (GLOVE) ×2 IMPLANT
GLOVE EXAM NITRILE MD LF STRL (GLOVE) ×2 IMPLANT
GLOVE INDICATOR 7.0 STRL GRN (GLOVE) ×2 IMPLANT
GOWN BRE IMP SLV AUR XL STRL (GOWN DISPOSABLE) ×4 IMPLANT
GOWN STRL REIN XL XLG (GOWN DISPOSABLE) ×2 IMPLANT
INST SET LAPROSCOPIC GYN AP (KITS) ×2 IMPLANT
IV NS IRRIG 3000ML ARTHROMATIC (IV SOLUTION) ×2 IMPLANT
KIT ROOM TURNOVER APOR (KITS) ×2 IMPLANT
MANIFOLD NEPTUNE II (INSTRUMENTS) ×2 IMPLANT
NDL HYPO 25X1 1.5 SAFETY (NEEDLE) ×1 IMPLANT
NDL INSUFFLATION 14GA 120MM (NEEDLE) ×1 IMPLANT
NEEDLE HYPO 25X1 1.5 SAFETY (NEEDLE) IMPLANT
NEEDLE INSUFFLATION 14GA 120MM (NEEDLE) ×2 IMPLANT
PACK PERI GYN (CUSTOM PROCEDURE TRAY) ×2 IMPLANT
PAD ARMBOARD 7.5X6 YLW CONV (MISCELLANEOUS) ×3 IMPLANT
POUCH SPECIMEN RETRIEVAL 10MM (ENDOMECHANICALS) IMPLANT
SCALPEL HARMONIC ACE (MISCELLANEOUS) ×1 IMPLANT
SET BASIN LINEN APH (SET/KITS/TRAYS/PACK) ×2 IMPLANT
SET TUBE IRRIG SUCTION NO TIP (IRRIGATION / IRRIGATOR) ×2 IMPLANT
SOLUTION ANTI FOG 6CC (MISCELLANEOUS) ×2 IMPLANT
STAPLER VISISTAT 35W (STAPLE) ×2 IMPLANT
SUT VIC AB 2-0 CT2 27 (SUTURE) ×2 IMPLANT
SUT VIC AB 3-0 SH 27 (SUTURE) ×2
SUT VIC AB 3-0 SH 27X BRD (SUTURE) ×1 IMPLANT
SUT VICRYL 0 UR6 27IN ABS (SUTURE) ×2 IMPLANT
SYR CONTROL 10ML LL (SYRINGE) ×1 IMPLANT
SYRINGE 10CC LL (SYRINGE) ×2 IMPLANT
TROCAR Z-THAD FIOS HNDL 12X100 (TROCAR) ×2 IMPLANT
TROCAR Z-THRD FIOS HNDL 11X100 (TROCAR) ×2 IMPLANT
TROCAR Z-THREAD SLEEVE 11X100 (TROCAR) ×4 IMPLANT
TUBING HI FLO HEAT INSUFFLATOR (IRRIGATION / IRRIGATOR) ×2 IMPLANT
WARMER LAPAROSCOPE (MISCELLANEOUS) ×2 IMPLANT

## 2011-04-12 NOTE — Op Note (Signed)
Preoperative diagnosis:  1.  Adneomyosis                                         2.  Menometrorrhagia                                         3.  Dysmenorrhea                                         4.  Dyspareunia  Postoperative diagnoses:  Same as above   Procedure:  Laparoscopic intrafascial total hysterectomy   Surgeon:  Lazaro Arms, MD  Assistant:  None   Anesthesia:  Gen. Endotracheal  Findings: The patient was known to have adenomyosis, sever, by sonogram and did not respond to conservative measures over the past 3+ years.  She also has bump dyspareunia, confirmed by exam in the office.  As a result proceeded with Laparoscopic total hysterectomy with maintenance of the cervical support by doing an intrafascial cervical removal.  She will not need Paps but maintains the cervical stroma for support.  The uterosacral and cardinal ligaments were preserved.  Patient only has midline pain, no lateral pain at all.  Description of operation:  The patient was taken to the operating room and placed in the supine position where she underwent general endotracheal anesthesia. The patient was then placed in the low lithotomy position and was prepped and draped in the usual sterile fashion. A Foley catheter was placed. An incision was made in the umbilicus.  A Veres needle was used and peritoneal insufflation was performed.  An 11 mm non-bladed trocar was placed into the peritoneal cavity with one pass under direct visualization without difficulty.  Incisions were then made in the right and left lower quadrant and 11 mm trochars were placed again under direct visualization without difficulty.  The right adnexa was grasped and the harmonic scalpel was used to coagulate and transect the right round ligament. The right utero ovarian  ligament was also coagulated and transected with good hemostasis. The uterine vessels were skeletonized and the peritoneum was reflected off the lower uterine segment.  The  left uterine cornu was then grasped and the left round ligament was coagulated and transected with the harmonic scalpel. The left utero ovarian ligament was then transected and coagulated. The left uterine vessels were skeletonized the peritoneal reflection was also taken down off the lower uterine segment without difficulty.  As result, both ovaries were preserved, they were normal.  Hemoclips were then placed just below the level of the internal os bilaterally to prevent backbleeding. The uterine vessels were then coagulated and transected bilaterally using the harmonic scalpel. There was good hemostasis. 2 pedicles were then taken down the cardinal ligament medial to the transected uterine vessel pedicle and the cervix was transected at this level using the harmonic scalpel on low setting.  I then did an intrafascial excision of the cervix, maintaining the lateral and posterior cervical support but removing the central cervix all the way down to the external os.  There was a small, but hemostatic, central cervical defect which will heal and seal postoperatively.  No vagina was cross clamped.  I essentially  did a reverse cervical conization.  All pedicles were hemostatic and the specimen was freely transected from the cervix.  The uterine morcellator was then placed in the left lower quadrant and the uterus, and  cervix were removed in strips using the morcellator. This was done safely under direct visualization.  The pelvis was irrigated and all pedicles found to be hemostatic. Additional fluid was left inside the peritoneum to prevent her diminish future adhesion formation. The ovaries again were normal and, as per preoperative plan, left in place.  All 3 incisions were closed for a peritoneum and fascia closed with single sutures using 0 Vicryl. Subcutaneous tissue was also closed using 0 Vicryl and the skin was closed using skin staples. The patient tolerated the procedure well she experienced  approximately 150 cc of blood loss. She received 1 g of Ancef and 1 gram of Acetominophenl preoperatively prophylactically.  (She is highly allergic to NSAIDs)  She was awakened from anesthesia taken to the recovery room in good stable condition with all counts being CORRECT x3.  We are planning to discharge patient home from the PACU.  EURE,LUTHER H 04/12/2011, 11:55 AM

## 2011-04-12 NOTE — Anesthesia Postprocedure Evaluation (Addendum)
  Anesthesia Post-op Note  Patient: Cassandra Lucas  Procedure(s) Performed:  HYSTERECTOMY TOTAL LAPAROSCOPIC  Patient Location: shortstay  Anesthesia Type: General  Level of Consciousness: awake  Airway and Oxygen Therapy: Patient Spontanous Breathing  Post-op Pain: mild  Post-op Assessment: Post-op Vital signs reviewed, Patient's Cardiovascular Status Stable, Respiratory Function Stable and No signs of Nausea or vomiting  Post-op Vital Signs: Reviewed and stable  Complications: No apparent anesthesia complications

## 2011-04-12 NOTE — Anesthesia Preprocedure Evaluation (Addendum)
Anesthesia Evaluation  Name, MR# and DOB Patient awake  General Assessment Comment  Reviewed: Allergy & Precautions, H&P  and Patient's Chart, lab work & pertinent test results  History of Anesthesia Complications Negative for: history of anesthetic complications  Airway Mallampati: II  Neck ROM: Full    Dental  (+) Teeth Intact   Pulmonary      Cardiovascular Regular Normal   Neuro/Psych (+) {AN ROS/MED HX NEURO HEADACHES (+) Anxiety,   GI/Hepatic/Renal   Endo/Other   Abdominal   Musculoskeletal  Hematology   Peds  Reproductive/Obstetrics   Anesthesia Other Findings             Anesthesia Physical Anesthesia Plan  ASA: II  Anesthesia Plan: General   Post-op Pain Management:    Induction: Intravenous  Airway Management Planned: Oral ETT  Additional Equipment:   Intra-op Plan:   Post-operative Plan:   Informed Consent: I have reviewed the patients History and Physical, chart, labs and discussed the procedure including the risks, benefits and alternatives for the proposed anesthesia with the patient or authorized representative who has indicated his/her understanding and acceptance.     Plan Discussed with:   Anesthesia Plan Comments:         Anesthesia Quick Evaluation

## 2011-04-12 NOTE — Anesthesia Procedure Notes (Addendum)
Procedure Name: Intubation Date/Time: 04/12/2011 10:00 AM Performed by: Minerva Areola Pre-anesthesia Checklist: Patient identified, Patient being monitored, Timeout performed, Emergency Drugs available and Suction available Patient Re-evaluated:Patient Re-evaluated prior to inductionOxygen Delivery Method: Circle System Utilized Preoxygenation: Pre-oxygenation with 100% oxygen Intubation Type: IV induction Ventilation: Mask ventilation without difficulty Laryngoscope Size: Miller and 2 Grade View: Grade I Tube type: Oral Tube size: 7.0 mm Number of attempts: 1 Airway Equipment and Method: stylet Placement Confirmation: ETT inserted through vocal cords under direct vision,  positive ETCO2 and breath sounds checked- equal and bilateral Secured at: 22 cm Tube secured with: Tape Dental Injury: Teeth and Oropharynx as per pre-operative assessment

## 2011-04-12 NOTE — Transfer of Care (Signed)
Immediate Anesthesia Transfer of Care Note  Patient: Cassandra Lucas  Procedure(s) Performed:  HYSTERECTOMY TOTAL LAPAROSCOPIC  Patient Location: PACU  Anesthesia Type: General  Level of Consciousness: awake and patient cooperative  Airway & Oxygen Therapy: Patient Spontanous Breathing and non-rebreather face mask  Post-op Assessment: Report given to PACU RN, Post -op Vital signs reviewed and stable and Patient moving all extremities  Post vital signs: Reviewed and stable  Complications: No apparent anesthesia complications

## 2011-04-15 LAB — TYPE AND SCREEN
ABO/RH(D): A POS
Antibody Screen: NEGATIVE

## 2011-04-24 ENCOUNTER — Encounter (HOSPITAL_COMMUNITY): Payer: Self-pay | Admitting: Obstetrics & Gynecology

## 2011-06-16 LAB — CBC
Hemoglobin: 14.5
RBC: 4.7

## 2011-06-16 LAB — URINE MICROSCOPIC-ADD ON

## 2011-06-16 LAB — DIFFERENTIAL
Basophils Absolute: 0
Lymphocytes Relative: 27
Monocytes Absolute: 0.7
Monocytes Relative: 7
Neutro Abs: 6.9
Neutrophils Relative %: 64

## 2011-06-16 LAB — URINALYSIS, ROUTINE W REFLEX MICROSCOPIC
Bilirubin Urine: NEGATIVE
Glucose, UA: NEGATIVE
Specific Gravity, Urine: >1.03 — ABNORMAL HIGH
pH: 6

## 2011-06-16 LAB — BASIC METABOLIC PANEL
Calcium: 9.3
Creatinine, Ser: 0.64
GFR calc Af Amer: >60
GFR calc non Af Amer: >60
Sodium: 138

## 2011-10-29 ENCOUNTER — Encounter (HOSPITAL_COMMUNITY): Payer: Self-pay

## 2011-10-29 ENCOUNTER — Emergency Department (HOSPITAL_COMMUNITY)
Admission: EM | Admit: 2011-10-29 | Discharge: 2011-10-29 | Disposition: A | Discharge status: Home / Self Care | Payer: Medicaid Other | Attending: Emergency Medicine | Admitting: Emergency Medicine

## 2011-10-29 DIAGNOSIS — F17200 Nicotine dependence, unspecified, uncomplicated: Secondary | ICD-10-CM | POA: Insufficient documentation

## 2011-10-29 DIAGNOSIS — R3911 Hesitancy of micturition: Secondary | ICD-10-CM | POA: Insufficient documentation

## 2011-10-29 DIAGNOSIS — Z79899 Other long term (current) drug therapy: Secondary | ICD-10-CM | POA: Insufficient documentation

## 2011-10-29 DIAGNOSIS — F411 Generalized anxiety disorder: Secondary | ICD-10-CM | POA: Insufficient documentation

## 2011-10-29 DIAGNOSIS — M65839 Other synovitis and tenosynovitis, unspecified forearm: Secondary | ICD-10-CM | POA: Insufficient documentation

## 2011-10-29 DIAGNOSIS — R319 Hematuria, unspecified: Secondary | ICD-10-CM | POA: Insufficient documentation

## 2011-10-29 DIAGNOSIS — R109 Unspecified abdominal pain: Secondary | ICD-10-CM

## 2011-10-29 DIAGNOSIS — M25539 Pain in unspecified wrist: Secondary | ICD-10-CM | POA: Insufficient documentation

## 2011-10-29 DIAGNOSIS — M778 Other enthesopathies, not elsewhere classified: Secondary | ICD-10-CM

## 2011-10-29 DIAGNOSIS — M259 Joint disorder, unspecified: Secondary | ICD-10-CM | POA: Insufficient documentation

## 2011-10-29 DIAGNOSIS — R112 Nausea with vomiting, unspecified: Secondary | ICD-10-CM | POA: Insufficient documentation

## 2011-10-29 DIAGNOSIS — M79609 Pain in unspecified limb: Secondary | ICD-10-CM | POA: Insufficient documentation

## 2011-10-29 LAB — DIFFERENTIAL
Eosinophils Absolute: 0.5 10*3/uL (ref 0.0–0.7)
Lymphocytes Relative: 26 % (ref 12–46)
Lymphs Abs: 3.4 10*3/uL (ref 0.7–4.0)
Monocytes Relative: 5 % (ref 3–12)
Neutrophils Relative %: 65 % (ref 43–77)

## 2011-10-29 LAB — COMPREHENSIVE METABOLIC PANEL
ALT: 13 U/L (ref 0–35)
Alkaline Phosphatase: 77 U/L (ref 39–117)
BUN: 14 mg/dL (ref 6–23)
CO2: 26 mEq/L (ref 19–32)
Chloride: 105 mEq/L (ref 96–112)
GFR calc Af Amer: >90 mL/min (ref >90)
GFR calc non Af Amer: >90 mL/min (ref >90)
Glucose, Bld: 92 mg/dL (ref 70–99)
Potassium: 4.6 mEq/L (ref 3.5–5.1)
Sodium: 140 mEq/L (ref 135–145)
Total Bilirubin: 0.2 mg/dL — ABNORMAL LOW (ref 0.3–1.2)

## 2011-10-29 LAB — URINALYSIS, ROUTINE W REFLEX MICROSCOPIC
Bilirubin Urine: NEGATIVE
Nitrite: NEGATIVE
Specific Gravity, Urine: 1.015 (ref 1.005–1.030)
pH: 7 (ref 5.0–8.0)

## 2011-10-29 LAB — CBC
Hemoglobin: 14.6 g/dL (ref 12.0–15.0)
MCH: 32.7 pg (ref 26.0–34.0)
Platelets: 237 10*3/uL (ref 150–400)
RBC: 4.46 MIL/uL (ref 3.87–5.11)
WBC: 13.1 10*3/uL — ABNORMAL HIGH (ref 4.0–10.5)

## 2011-10-29 LAB — URINE MICROSCOPIC-ADD ON

## 2011-10-29 MED ORDER — ONDANSETRON HCL 4 MG PO TABS: 4.0000 mg | ORAL_TABLET | Freq: Four times a day (QID) | ORAL | Status: AC

## 2011-10-29 MED ORDER — HYDROMORPHONE HCL PF 1 MG/ML IJ SOLN
1.0000 mg | Freq: Once | INTRAMUSCULAR | Status: DC
  Filled 2011-10-29: qty 1

## 2011-10-29 MED ORDER — OXYCODONE-ACETAMINOPHEN 5-325 MG PO TABS
1.0000 | ORAL_TABLET | Freq: Once | ORAL | Status: AC
  Administered 2011-10-29: 1 via ORAL
  Filled 2011-10-29: qty 1

## 2011-10-29 MED ORDER — ONDANSETRON 8 MG PO TBDP
8.0000 mg | ORAL_TABLET | Freq: Once | ORAL | Status: AC
  Administered 2011-10-29: 8 mg via ORAL
  Filled 2011-10-29: qty 1

## 2011-10-29 MED ORDER — ONDANSETRON HCL 4 MG/2ML IJ SOLN
4.0000 mg | Freq: Once | INTRAMUSCULAR | Status: DC
  Filled 2011-10-29: qty 2

## 2011-10-29 MED ORDER — OXYCODONE-ACETAMINOPHEN 5-325 MG PO TABS: 1.0000 | ORAL_TABLET | ORAL | Status: AC | PRN

## 2011-10-29 NOTE — ED Provider Notes (Signed)
Medical screening examination/treatment/procedure(s) were performed by non-physician practitioner and as supervising physician I was immediately available for consultation/collaboration.   Loren Racer, MD 10/29/11 [phone]

## 2011-10-29 NOTE — ED Notes (Signed)
Pt presents with left flank pain and n/v x 2 days.

## 2011-10-29 NOTE — ED Provider Notes (Signed)
History     CSN: [id number]  Arrival date & time 10/29/11  1315   First MD Initiated Contact with Patient 10/29/11 1619      Chief Complaint  Patient presents with  . Flank Pain  . Nausea  . Emesis    (Consider location/radiation/quality/duration/timing/severity/associated sxs/prior treatment) HPI Comments: Patient with a history of multiple kidney stones comes to the emergency department with complaint of left flank pain for 2 days. She also reports nausea and vomiting.  She states her pain is similar to previous kidney stone pain. She does report some urinary hesitancy and dark-colored urine. She denies lower abdominal or pelvic pain.  Also denies fever, vaginal bleeding or discharge.  Patient also complains of pain to her right wrist and thumb for several days. States pain is worse with movement and use of her right hand. She denies numbness or weakness of her hand. She also denies recent injury.  Patient is a 36 y.o. female presenting with flank pain and vomiting. The history is provided by the patient. No language interpreter was used.  Flank Pain This is a recurrent problem. The current episode started in the past 7 days. The problem occurs intermittently. The problem has been gradually worsening. Associated symptoms include nausea, urinary symptoms and vomiting. Pertinent negatives include no abdominal pain, arthralgias, fever, headaches, joint swelling, myalgias, neck pain, numbness, rash, sore throat or weakness. The symptoms are aggravated by nothing. She has tried nothing for the symptoms. The treatment provided moderate relief.  Emesis  Pertinent negatives include no abdominal pain, no arthralgias, no diarrhea, no fever, no headaches and no myalgias.    Past Medical History  Diagnosis Date  . Productive cough     smoker  . Anxiety   . Anemia     Past Surgical History  Procedure Date  . Thyroidectomy, partial 2012    [facility], [facility]  . Laparoscopic cholecystectomy 2002      [facility]  . Breast cyst excision   . Cesarean section 2006    [facility]  . Laparoscopic hysterectomy 04/12/2011    Procedure: HYSTERECTOMY TOTAL LAPAROSCOPIC;  Surgeon: Lazaro Arms, MD;  Location: [facility];  Service: Gynecology;  Laterality: N/A;    History reviewed. No pertinent family history.  History  Substance Use Topics  . Smoking status: Current Everyday Smoker -- 1.0 packs/day for 17 years    Types: Cigarettes  . Smokeless tobacco: Not on file  . Alcohol Use: No    OB History    Grav Para Term Preterm Abortions TAB SAB Ect Mult Living   4 4 0 0 0 0 0 0 0 4       Review of Systems  Constitutional: Negative for fever, activity change and appetite change.  HENT: Negative for sore throat and neck pain.   Gastrointestinal: Positive for nausea and vomiting. Negative for abdominal pain and diarrhea.  Genitourinary: Positive for hematuria and flank pain. Negative for dysuria, difficulty urinating and pelvic pain.  Musculoskeletal: Negative for myalgias, joint swelling and arthralgias.  Skin: Negative.  Negative for rash.  Neurological: Negative for dizziness, weakness, numbness and headaches.  All other systems reviewed and are negative.    Allergies  Nsaids; Aspirin; and Amoxicillin  Home Medications   Current Outpatient Rx  Name Route Sig Dispense Refill  . CYCLOBENZAPRINE HCL 10 MG PO TABS Oral Take 10 mg by mouth 3 (three) times daily as needed.        BP 132/94  Pulse 110  Temp(Src) 98.4 F (  36.9 C) (Oral)  Resp 20  Ht 4\' 9"  (1.448 m)  Wt 185 lb (83.915 kg)  BMI 40.03 kg/m2  SpO2 100%  LMP 01/22/2011  Physical Exam  Nursing note and vitals reviewed. Constitutional: She is oriented to person, place, and time. She appears well-developed and well-nourished. No distress.  HENT:  Head: Normocephalic and atraumatic.  Cardiovascular: Normal rate, regular rhythm and normal heart sounds.   No murmur heard. Pulmonary/Chest: Effort normal and breath sounds normal. No  respiratory distress.  Abdominal: Soft. Bowel sounds are normal. She exhibits no distension. There is no tenderness. There is no rigidity, no rebound, no guarding, no CVA tenderness and no tenderness at McBurney's point.  Musculoskeletal: She exhibits tenderness. She exhibits no edema.       Right wrist: She exhibits tenderness and crepitus. She exhibits normal range of motion, no bony tenderness, no swelling, no effusion, no deformity and no laceration.       Mild crepitus and tenderness with flexion of the right wrist. No edema, erythema, grip strengths are equal bilaterally. Radial pulse is brisk. Cap refill is less than 2 seconds.  Neurological: She is alert and oriented to person, place, and time. She exhibits normal muscle tone. Coordination normal.  Skin: Skin is warm and dry.    ED Course  Procedures (including critical care time)  Labs Reviewed  URINALYSIS, ROUTINE W REFLEX MICROSCOPIC - Abnormal; Notable for the following:    Hgb urine dipstick TRACE (*)    All other components within normal limits  CBC - Abnormal; Notable for the following:    WBC 13.1 (*)    All other components within normal limits  DIFFERENTIAL - Abnormal; Notable for the following:    Neutro Abs 8.6 (*)    All other components within normal limits  COMPREHENSIVE METABOLIC PANEL - Abnormal; Notable for the following:    Total Bilirubin 0.2 (*)    All other components within normal limits  URINE MICROSCOPIC-ADD ON - Abnormal; Notable for the following:    Squamous Epithelial / LPF FEW (*)    Bacteria, UA FEW (*)    All other components within normal limits    Velcro wrist splint was applied by the nursing staff. Neurovascular exam remained intact. Pain improved.    MDM    Patient is alert nontoxic appearing. Should history of multiple kidney stones.  Left flank pain today feels similar to previous kidney stones.  She is ambulated to the restroom without difficulty her gait is steady.  No active  vomiting during ED stay.  Patient has refused IV medication. She also prefers not to have any imaging done today.  Will treat her with pain medication and anti-emetics, she agrees to return to the emergency department in 1-2 days if her symptoms worsen. Also given her a followup referral for urology.     Wrist pain is likely related to tendinitis.     Kuulei Kleier L. Naraly Fritcher, PA 10/29/11 1720  Aundraya Dripps L. Demetrion Wesby, [place] 10/29/11 1722

## 2011-11-01 ENCOUNTER — Ambulatory Visit (HOSPITAL_COMMUNITY)
Admission: RE | Admit: 2011-11-01 | Discharge: 2011-11-01 | Disposition: A | Discharge status: Home / Self Care | Payer: Medicaid Other | Source: Ambulatory Visit | Attending: Family Medicine | Admitting: Family Medicine

## 2011-11-01 ENCOUNTER — Other Ambulatory Visit (HOSPITAL_COMMUNITY): Payer: Self-pay | Admitting: Family Medicine

## 2011-11-01 DIAGNOSIS — M542 Cervicalgia: Secondary | ICD-10-CM | POA: Insufficient documentation

## 2011-11-01 DIAGNOSIS — M1990 Unspecified osteoarthritis, unspecified site: Secondary | ICD-10-CM

## 2011-11-06 ENCOUNTER — Encounter (HOSPITAL_COMMUNITY): Payer: Self-pay | Admitting: *Deleted

## 2011-11-06 ENCOUNTER — Emergency Department (HOSPITAL_COMMUNITY)
Admission: EM | Admit: 2011-11-06 | Discharge: 2011-11-06 | Disposition: A | Discharge status: Home / Self Care | Payer: Medicaid Other | Attending: Emergency Medicine | Admitting: Emergency Medicine

## 2011-11-06 DIAGNOSIS — K089 Disorder of teeth and supporting structures, unspecified: Secondary | ICD-10-CM | POA: Insufficient documentation

## 2011-11-06 DIAGNOSIS — F17200 Nicotine dependence, unspecified, uncomplicated: Secondary | ICD-10-CM | POA: Insufficient documentation

## 2011-11-06 DIAGNOSIS — Z862 Personal history of diseases of the blood and blood-forming organs and certain disorders involving the immune mechanism: Secondary | ICD-10-CM | POA: Insufficient documentation

## 2011-11-06 DIAGNOSIS — K047 Periapical abscess without sinus: Secondary | ICD-10-CM

## 2011-11-06 DIAGNOSIS — K0381 Cracked tooth: Secondary | ICD-10-CM | POA: Insufficient documentation

## 2011-11-06 HISTORY — DX: Disorder of kidney and ureter, unspecified: N28.9

## 2011-11-06 MED ORDER — AMOXICILLIN 500 MG PO CAPS: 500.0000 mg | ORAL_CAPSULE | Freq: Three times a day (TID) | ORAL | Status: AC

## 2011-11-06 MED ORDER — HYDROCODONE-ACETAMINOPHEN 5-325 MG PO TABS: 1.0000 | ORAL_TABLET | ORAL | Status: AC | PRN

## 2011-11-06 NOTE — ED Notes (Signed)
Pain rt upper jaw, part of upper tooth broken off

## 2011-11-06 NOTE — Discharge Instructions (Signed)
Abscessed Tooth °A tooth abscess is a collection of infected fluid (pus) from a bacterial infection in the inner part of the tooth (pulp). It usually occurs at the end of the tooth's root.  °CAUSES  °· A very bad cavity (extensive tooth decay).  °· Trauma to the tooth, such as a broken or chipped tooth, that allows bacteria to enter into the pulp.  °SYMPTOMS °· Severe pain in and around the infected tooth.  °· Swelling and redness around the abscessed tooth or in the mouth or face.  °· Tenderness.  °· Pus drainage.  °· Bad breath.  °· Bitter taste in the mouth.  °· Difficulty swallowing.  °· Difficulty opening the mouth.  °· Feeling sick to your stomach (nauseous).  °· Vomiting.  °· Chills.  °· Swollen neck glands.  °DIAGNOSIS °· A medical and dental history will be taken.  °· An examination will be performed by tapping on the abscessed tooth.  °· X-rays may be taken of the tooth to identify the abscess.  °TREATMENT °The goal of treatment is to eliminate the infection.  °· You may be prescribed antibiotic medicine to stop the infection from spreading.  °· A root canal may be performed to save the tooth. If the tooth cannot be saved, it may be pulled (extracted) and the abscess may be drained.  °HOME CARE INSTRUCTIONS °· Only take over-the-counter or prescription medicines for pain, fever, or discomfort as directed by your caregiver.  °· Do not drive after taking pain medicine (narcotics).  °· Rinse your mouth (gargle) often with salt water (¼ tsp salt in 8 oz of warm water) to relieve pain or swelling.  °· Do not apply heat to the outside of your face.  °· Return to your dentist for further treatment as directed.  °SEEK IMMEDIATE DENTAL CARE IF: °· You have a temperature by mouth above 102° F (38.9° C), not controlled by medicine.  °· You have chills or a very bad headache.  °· You have problems breathing or swallowing.  °· Your have trouble opening your mouth.  °· You develop swelling in the neck or around the eye.   °· Your pain is not helped by medicine.  °· Your pain is getting worse instead of better.  °Document Released: 09/04/2005 Document Revised: 05/17/2011 Document Reviewed: 12/13/2010 °ExitCare® Patient Information ©2012 [employer]. °

## 2011-11-07 NOTE — ED Provider Notes (Signed)
History     CSN: [id number]  Arrival date & time 11/06/11  4098   First MD Initiated Contact with Patient 11/06/11 1956      Chief Complaint  Patient presents with  . Dental Pain    (Consider location/radiation/quality/duration/timing/severity/associated sxs/prior treatment) Patient is a 36 y.o. female presenting with tooth pain. The history is provided by the patient.  Dental PainThe primary symptoms include dental injury. Primary symptoms do not include headaches, fever, shortness of breath or sore throat. Primary symptoms comment: She reports fracturing a tooth over a week ago.  Another piece has fallen off the tooth and now has increased pain. The symptoms began more than 1 week ago. The symptoms are worsening. The symptoms occur constantly.  The dental injury occurred more than 1 week ago. Affected teeth include: 4/right upper second bicuspid. The injury is a chip. The injury was caused by an unknown mechanism.  Additional symptoms include: dental sensitivity to temperature, gum swelling and gum tenderness.    Past Medical History  Diagnosis Date  . Productive cough     smoker  . Anxiety   . Anemia   . Renal disorder     Past Surgical History  Procedure Date  . Thyroidectomy, partial 2012    [facility]  . Laparoscopic cholecystectomy 2002    [facility]  . Breast cyst excision   . Cesarean section 2006    [facility]  . Laparoscopic hysterectomy 04/12/2011    Procedure: HYSTERECTOMY TOTAL LAPAROSCOPIC;  Surgeon: Lazaro Arms, MD;  Location: [facility];  Service: Gynecology;  Laterality: N/A;  . Abdominal hysterectomy     History reviewed. No pertinent family history.  History  Substance Use Topics  . Smoking status: Current Everyday Smoker -- 1.0 packs/day for 17 years    Types: Cigarettes  . Smokeless tobacco: Not on file  . Alcohol Use: No    OB History    Grav Para Term Preterm Abortions TAB SAB Ect Mult Living   4 4 0 0 0 0 0 0 0 4       Review of Systems    Constitutional: Negative for fever.  HENT: Positive for dental problem. Negative for congestion, sore throat and neck pain.   Eyes: Negative.   Respiratory: Negative for chest tightness and shortness of breath.   Cardiovascular: Negative for chest pain.  Gastrointestinal: Negative for nausea and abdominal pain.  Genitourinary: Negative.   Musculoskeletal: Negative for joint swelling and arthralgias.  Skin: Negative.  Negative for rash and wound.  Neurological: Negative for dizziness, weakness, light-headedness, numbness and headaches.  Hematological: Negative.   Psychiatric/Behavioral: Negative.     Allergies  Nsaids and Aspirin  Home Medications   Current Outpatient Rx  Name Route Sig Dispense Refill  . CYCLOBENZAPRINE HCL 10 MG PO TABS Oral Take 10 mg by mouth 3 (three) times daily as needed.      . OXYCODONE-ACETAMINOPHEN 5-325 MG PO TABS Oral Take 1 tablet by mouth every 4 (four) hours as needed for pain. 20 tablet 0  . AMOXICILLIN 500 MG PO CAPS Oral Take 1 capsule (500 mg total) by mouth 3 (three) times daily. 30 capsule 0  . HYDROCODONE-ACETAMINOPHEN 5-325 MG PO TABS Oral Take 1 tablet by mouth every 4 (four) hours as needed for pain. 15 tablet 0    BP 132/92  Pulse 99  Temp(Src) 98.6 F (37 C) (Oral)  Resp 20  Wt 184 lb (83.462 kg)  LMP 01/22/2011  Physical Exam  Constitutional: She is  oriented to person, place, and time. She appears well-developed and well-nourished. No distress.  HENT:  Head: Normocephalic and atraumatic.  Right Ear: Tympanic membrane and external ear normal.  Left Ear: Tympanic membrane and external ear normal.  Mouth/Throat: Oropharynx is clear and moist and mucous membranes are normal. No oral lesions. Dental abscesses present.    Eyes: Conjunctivae are normal.  Neck: Normal range of motion. Neck supple.  Cardiovascular: Normal rate and normal heart sounds.   Pulmonary/Chest: Effort normal.  Abdominal: She exhibits no distension.   Musculoskeletal: Normal range of motion.  Lymphadenopathy:    She has no cervical adenopathy.  Neurological: She is alert and oriented to person, place, and time.  Skin: Skin is warm and dry. No erythema.  Psychiatric: She has a normal mood and affect.    ED Course  Procedures (including critical care time)  Labs Reviewed - No data to display No results found.   1. Dental abscess       MDM  Amoxil,  Hydrocodone.  Dental referral.   Medical screening examination/treatment/procedure(s) were performed by non-physician practitioner and as supervising physician I was immediately available for consultation/collaboration. Osvaldo Human, M.D.    Candis Musa, PA 11/07/11 1354  Carleene Cooper III, MD 11/08/11 [phone]

## 2011-11-24 ENCOUNTER — Other Ambulatory Visit (HOSPITAL_COMMUNITY): Payer: Self-pay | Admitting: Family Medicine

## 2011-11-24 ENCOUNTER — Ambulatory Visit (HOSPITAL_COMMUNITY)
Admission: RE | Admit: 2011-11-24 | Discharge: 2011-11-24 | Disposition: A | Discharge status: Home / Self Care | Payer: Medicaid Other | Source: Ambulatory Visit | Attending: Family Medicine | Admitting: Family Medicine

## 2011-11-24 DIAGNOSIS — Z9089 Acquired absence of other organs: Secondary | ICD-10-CM | POA: Insufficient documentation

## 2011-11-24 DIAGNOSIS — M542 Cervicalgia: Secondary | ICD-10-CM

## 2012-01-02 ENCOUNTER — Other Ambulatory Visit (HOSPITAL_COMMUNITY): Payer: Self-pay | Admitting: Family Medicine

## 2012-01-02 DIAGNOSIS — M542 Cervicalgia: Secondary | ICD-10-CM

## 2012-01-09 ENCOUNTER — Ambulatory Visit (HOSPITAL_COMMUNITY)
Admission: RE | Admit: 2012-01-09 | Discharge: 2012-01-09 | Disposition: A | Discharge status: Home / Self Care | Payer: Medicaid Other | Source: Ambulatory Visit | Attending: Family Medicine | Admitting: Family Medicine

## 2012-01-09 DIAGNOSIS — M542 Cervicalgia: Secondary | ICD-10-CM | POA: Insufficient documentation

## 2012-01-09 DIAGNOSIS — M25519 Pain in unspecified shoulder: Secondary | ICD-10-CM | POA: Insufficient documentation

## 2012-02-20 ENCOUNTER — Other Ambulatory Visit: Payer: Self-pay | Admitting: Adult Health

## 2012-02-20 DIAGNOSIS — N63 Unspecified lump in unspecified breast: Secondary | ICD-10-CM

## 2012-02-28 ENCOUNTER — Ambulatory Visit (HOSPITAL_COMMUNITY): Payer: Medicaid Other

## 2012-03-06 ENCOUNTER — Ambulatory Visit (HOSPITAL_COMMUNITY): Admission: RE | Admit: 2012-03-06 | Payer: Medicaid Other | Source: Ambulatory Visit

## 2012-03-07 ENCOUNTER — Encounter (HOSPITAL_COMMUNITY): Payer: Self-pay

## 2012-03-07 ENCOUNTER — Emergency Department (HOSPITAL_COMMUNITY)
Admission: EM | Admit: 2012-03-07 | Discharge: 2012-03-07 | Disposition: A | Discharge status: Home / Self Care | Payer: Medicaid Other | Attending: Emergency Medicine | Admitting: Emergency Medicine

## 2012-03-07 ENCOUNTER — Emergency Department (HOSPITAL_COMMUNITY): Payer: Medicaid Other

## 2012-03-07 DIAGNOSIS — N390 Urinary tract infection, site not specified: Secondary | ICD-10-CM

## 2012-03-07 DIAGNOSIS — F17200 Nicotine dependence, unspecified, uncomplicated: Secondary | ICD-10-CM | POA: Insufficient documentation

## 2012-03-07 DIAGNOSIS — Z87442 Personal history of urinary calculi: Secondary | ICD-10-CM | POA: Insufficient documentation

## 2012-03-07 DIAGNOSIS — R109 Unspecified abdominal pain: Secondary | ICD-10-CM | POA: Insufficient documentation

## 2012-03-07 DIAGNOSIS — F411 Generalized anxiety disorder: Secondary | ICD-10-CM | POA: Insufficient documentation

## 2012-03-07 DIAGNOSIS — N201 Calculus of ureter: Secondary | ICD-10-CM

## 2012-03-07 HISTORY — DX: Calculus of kidney: N20.0

## 2012-03-07 LAB — CBC
Hemoglobin: 14.7 g/dL (ref 12.0–15.0)
MCH: 32.3 pg (ref 26.0–34.0)
MCV: 92.3 fL (ref 78.0–100.0)
Platelets: 219 10*3/uL (ref 150–400)
RBC: 4.55 MIL/uL (ref 3.87–5.11)
WBC: 8.9 10*3/uL (ref 4.0–10.5)

## 2012-03-07 LAB — DIFFERENTIAL
Eosinophils Relative: 3 % (ref 0–5)
Lymphocytes Relative: 27 % (ref 12–46)
Lymphs Abs: 2.4 10*3/uL (ref 0.7–4.0)
Monocytes Relative: 6 % (ref 3–12)
Neutrophils Relative %: 64 % (ref 43–77)

## 2012-03-07 LAB — URINALYSIS, ROUTINE W REFLEX MICROSCOPIC
Bilirubin Urine: NEGATIVE
Glucose, UA: NEGATIVE mg/dL
Ketones, ur: NEGATIVE mg/dL
Nitrite: NEGATIVE
Specific Gravity, Urine: 1.025 (ref 1.005–1.030)
pH: 6 (ref 5.0–8.0)

## 2012-03-07 LAB — BASIC METABOLIC PANEL
BUN: 10 mg/dL (ref 6–23)
CO2: 22 mEq/L (ref 19–32)
Glucose, Bld: 96 mg/dL (ref 70–99)
Potassium: 4.3 mEq/L (ref 3.5–5.1)
Sodium: 138 mEq/L (ref 135–145)

## 2012-03-07 LAB — URINE MICROSCOPIC-ADD ON

## 2012-03-07 MED ORDER — HYDROMORPHONE HCL PF 1 MG/ML IJ SOLN
1.0000 mg | Freq: Once | INTRAMUSCULAR | Status: AC
  Administered 2012-03-07: 1 mg via INTRAVENOUS
  Filled 2012-03-07: qty 1

## 2012-03-07 MED ORDER — ONDANSETRON HCL 4 MG/2ML IJ SOLN
4.0000 mg | Freq: Once | INTRAMUSCULAR | Status: AC
  Administered 2012-03-07: 4 mg via INTRAVENOUS
  Filled 2012-03-07: qty 2

## 2012-03-07 MED ORDER — DEXTROSE 5 % IV SOLN
1.0000 g | Freq: Once | INTRAVENOUS | Status: AC
  Administered 2012-03-07: 1 g via INTRAVENOUS
  Filled 2012-03-07: qty 10

## 2012-03-07 MED ORDER — CEPHALEXIN 500 MG PO CAPS: 500.0000 mg | ORAL_CAPSULE | Freq: Four times a day (QID) | ORAL | Status: DC

## 2012-03-07 MED ORDER — OXYCODONE-ACETAMINOPHEN 5-325 MG PO TABS: 1.0000 | ORAL_TABLET | ORAL | Status: DC | PRN

## 2012-03-07 NOTE — Discharge Instructions (Signed)
Kidney Stones Kidney stones (ureteral lithiasis) are solid masses that form inside your kidneys. The intense pain is caused by the stone moving through the kidney, ureter, bladder, and urethra (urinary tract). When the stone moves, the ureter starts to spasm around the stone. The stone is usually passed in the urine.  HOME CARE  Drink enough fluids to keep your pee (urine) clear or pale yellow. This helps to get the stone out.   Strain all pee through the provided strainer. Do not pee without peeing through the strainer, not even once. If you pee the stone out, catch it. The stone may be as small as a grain of salt. Take this to your doctor.   Only take medicine as told by your doctor.   Follow up with your doctor as told.   Get follow-up X-rays as told by your doctor.  GET HELP RIGHT AWAY IF:   Your pain does not get better with medicine.   You have a fever.   Your pain increases and gets worse over 18 hours.   You have new belly (abdominal) pain.   You feel faint or pass out.  MAKE SURE YOU:   Understand these instructions.   Will watch your condition.   Will get help right away if you are not doing well or get worse.  Document Released: 02/21/2008 Document Revised: 08/24/2011 Document Reviewed: 07/02/2009 ExitCare Patient Information 2012 [employer]. 

## 2012-03-07 NOTE — ED Provider Notes (Signed)
History   This chart was scribed for Cassandra Gaskins, MD by Clarita Crane. The patient was seen in room [facility]. Patient's care was started at 1046.    CSN: [id number]  Arrival date & time 03/07/12  1046   First MD Initiated Contact with Patient 03/07/12 1108      Chief Complaint  Patient presents with  . Flank Pain    HPI Cassandra Lucas is a 36 y.o. female who presents to the Emergency Department complaining of intermittent moderate to severe left sided flank pain described as similar to previous kidney stones experienced onset 3 days ago and persistent since with associated nausea. Patient notes having experienced kidney stones 18x previously and states she generally passes stones on her own. Denies needing surgical intervention to have kidney stones removed previously. Denies associated vomiting, diarrhea, vaginal bleeding, dysuria, chest pain. Patient with h/o kidney stones, anemia, anxiety, partial thyroidectomy, cholecystectomy, c-section, abdominal hysterectomy.   Past Medical History  Diagnosis Date  . Productive cough     smoker  . Anxiety   . Anemia   . Renal disorder   . Kidney stones     Past Surgical History  Procedure Date  . Thyroidectomy, partial 2012    [facility], [facility]  . Laparoscopic cholecystectomy 2002    [facility]  . Breast cyst excision   . Cesarean section 2006    [facility]  . Laparoscopic hysterectomy 04/12/2011    Procedure: HYSTERECTOMY TOTAL LAPAROSCOPIC;  Surgeon: Lazaro Arms, MD;  Location: [facility];  Service: Gynecology;  Laterality: N/A;  . Abdominal hysterectomy     No family history on file.  History  Substance Use Topics  . Smoking status: Current Everyday Smoker -- 1.0 packs/day for 17 years    Types: Cigarettes  . Smokeless tobacco: Not on file  . Alcohol Use: No    OB History    Grav Para Term Preterm Abortions TAB SAB Ect Mult Living   4 4 0 0 0 0 0 0 0 4       Review of Systems A complete 10 system review of systems was  obtained and all systems are negative except as noted in the HPI and PMH.   Allergies  Nsaids and Aspirin  Home Medications   Current Outpatient Rx  Name Route Sig Dispense Refill  . DIAZEPAM 10 MG PO TABS Oral Take 10 mg by mouth 2 (two) times daily.    . DULOXETINE HCL 30 MG PO CPEP Oral Take 30 mg by mouth daily.    Marland Kitchen METHOCARBAMOL 750 MG PO TABS Oral Take 750 mg by mouth 2 (two) times daily.    . OXYCODONE-ACETAMINOPHEN 10-325 MG PO TABS Oral Take 1 tablet by mouth 2 (two) times daily.      BP 137/108  Pulse 101  Temp 97.8 F (36.6 C) (Oral)  Resp 20  Ht 4\' 11"  (1.499 m)  Wt 186 lb (84.369 kg)  BMI 37.57 kg/m2  SpO2 97%  LMP 01/22/2011  Physical Exam CONSTITUTIONAL: Well developed/well nourished HEAD AND FACE: Normocephalic/atraumatic EYES: EOMI/PERRL ENMT: Mucous membranes moist NECK: supple no meningeal signs SPINE:entire spine nontender CV: S1/S2 noted, no murmurs/rubs/gallops noted LUNGS: Lungs are clear to auscultation bilaterally, no apparent distress ABDOMEN: soft, nontender, no rebound or guarding WU:[id number] cva tenderness NEURO: Pt is awake/alert, moves all extremitiesx4 EXTREMITIES: pulses normal, full ROM, DP pulses intact.  SKIN: warm, color normal PSYCH: anxious  ED Course  Procedures  DIAGNOSTIC STUDIES: Oxygen Saturation is 97% on room air,  normal by my interpretation.    COORDINATION OF CARE: 11:24AM-Patient informed of current plan for treatment and evaluation and agrees with plan at this time.  Will administer pain medication and consider CT-Scan.  Pain not improved ct scan ordered  2:08 PM Pain now improving Ureteral stone noted and likely uti abx ordered and will call urology after 3rd round of pain meds 3:29 PM Pt improved Afebrile, nontoxic appearing Spoke to dr Jerre Simon.  He recommends d/c home, antibiotics and pain control and will see next week Discussed strict return precautions with patient  The patient appears reasonably  screened and/or stabilized for discharge and I doubt any other medical condition or other [facility] requiring further screening, evaluation, or treatment in the ED at this time prior to discharge.    Results for orders placed during the hospital encounter of 03/07/12  URINALYSIS, ROUTINE W REFLEX MICROSCOPIC      Component Value Range   Color, Urine YELLOW  YELLOW   APPearance CLOUDY (*) CLEAR   Specific Gravity, Urine 1.025  1.005 - 1.030   pH 6.0  5.0 - 8.0   Glucose, UA NEGATIVE  NEGATIVE mg/dL   Hgb urine dipstick LARGE (*) NEGATIVE   Bilirubin Urine NEGATIVE  NEGATIVE   Ketones, ur NEGATIVE  NEGATIVE mg/dL   Protein, ur TRACE (*) NEGATIVE mg/dL   Urobilinogen, UA 0.2  0.0 - 1.0 mg/dL   Nitrite NEGATIVE  NEGATIVE   Leukocytes, UA NEGATIVE  NEGATIVE  POCT PREGNANCY, URINE      Component Value Range   Preg Test, Ur NEGATIVE  NEGATIVE  CBC      Component Value Range   WBC 8.9  4.0 - 10.5 K/uL   RBC 4.55  3.87 - 5.11 MIL/uL   Hemoglobin 14.7  12.0 - 15.0 g/dL   HCT 95.6  21.3 - 08.6 %   MCV 92.3  78.0 - 100.0 fL   MCH 32.3  26.0 - 34.0 pg   MCHC 35.0  30.0 - 36.0 g/dL   RDW 57.8  46.9 - 62.9 %   Platelets 219  150 - 400 K/uL  DIFFERENTIAL      Component Value Range   Neutrophils Relative 64  43 - 77 %   Neutro Abs 5.7  1.7 - 7.7 K/uL   Lymphocytes Relative 27  12 - 46 %   Lymphs Abs 2.4  0.7 - 4.0 K/uL   Monocytes Relative 6  3 - 12 %   Monocytes Absolute 0.5  0.1 - 1.0 K/uL   Eosinophils Relative 3  0 - 5 %   Eosinophils Absolute 0.3  0.0 - 0.7 K/uL   Basophils Relative 1  0 - 1 %   Basophils Absolute 0.1  0.0 - 0.1 K/uL  BASIC METABOLIC PANEL      Component Value Range   Sodium 138  135 - 145 mEq/L   Potassium 4.3  3.5 - 5.1 mEq/L   Chloride 105  96 - 112 mEq/L   CO2 22  19 - 32 mEq/L   Glucose, Bld 96  70 - 99 mg/dL   BUN 10  6 - 23 mg/dL   Creatinine, Ser 5.28  0.50 - 1.10 mg/dL   Calcium 9.3  8.4 - 41.3 mg/dL   GFR calc non Af Amer >90  >90 mL/min   GFR calc Af  Amer >90  >90 mL/min  URINE MICROSCOPIC-ADD ON      Component Value Range   RBC / HPF TOO NUMEROUS  TO COUNT  <3 RBC/hpf   Bacteria, UA MANY (*) RARE      MDM  Nursing notes including past medical history and social history reviewed and considered in documentation All labs/vitals reviewed and considered       I personally performed the services described in this documentation, which was scribed in my presence. The recorded information has been reviewed and considered.      Cassandra Gaskins, MD 03/07/12 1530

## 2012-03-07 NOTE — ED Notes (Signed)
Left flank pain for 3 days, has been to pmd and told was "muscles", h/o of stones x18, +nasuea, has blood in urine all the time.

## 2012-03-08 LAB — URINE CULTURE
Colony Count: 4000
Culture  Setup Time: 201306201100

## 2012-03-15 ENCOUNTER — Emergency Department (HOSPITAL_COMMUNITY): Payer: Medicaid Other

## 2012-03-15 ENCOUNTER — Emergency Department (HOSPITAL_COMMUNITY)
Admission: EM | Admit: 2012-03-15 | Discharge: 2012-03-15 | Disposition: A | Discharge status: Home / Self Care | Payer: Medicaid Other | Attending: Emergency Medicine | Admitting: Emergency Medicine

## 2012-03-15 ENCOUNTER — Encounter (HOSPITAL_COMMUNITY): Payer: Self-pay | Admitting: *Deleted

## 2012-03-15 DIAGNOSIS — D649 Anemia, unspecified: Secondary | ICD-10-CM | POA: Insufficient documentation

## 2012-03-15 DIAGNOSIS — N23 Unspecified renal colic: Secondary | ICD-10-CM | POA: Insufficient documentation

## 2012-03-15 DIAGNOSIS — F17200 Nicotine dependence, unspecified, uncomplicated: Secondary | ICD-10-CM | POA: Insufficient documentation

## 2012-03-15 LAB — URINALYSIS, ROUTINE W REFLEX MICROSCOPIC
Glucose, UA: NEGATIVE mg/dL
Ketones, ur: NEGATIVE mg/dL
Protein, ur: NEGATIVE mg/dL

## 2012-03-15 LAB — URINE MICROSCOPIC-ADD ON

## 2012-03-15 MED ORDER — ONDANSETRON 8 MG PO TBDP: 8.0000 mg | ORAL_TABLET | Freq: Three times a day (TID) | ORAL | Status: AC | PRN

## 2012-03-15 MED ORDER — HYDROMORPHONE HCL PF 2 MG/ML IJ SOLN
2.0000 mg | Freq: Once | INTRAMUSCULAR | Status: DC
  Filled 2012-03-15: qty 1

## 2012-03-15 MED ORDER — CIPROFLOXACIN HCL 500 MG PO TABS: 500.0000 mg | ORAL_TABLET | Freq: Two times a day (BID) | ORAL | Status: AC

## 2012-03-15 MED ORDER — OXYCODONE-ACETAMINOPHEN 5-325 MG PO TABS
2.0000 | ORAL_TABLET | Freq: Once | ORAL | Status: AC
  Administered 2012-03-15: 2 via ORAL
  Filled 2012-03-15: qty 2

## 2012-03-15 MED ORDER — HYDROMORPHONE HCL PF 2 MG/ML IJ SOLN: 2.0000 mg | Freq: Once | INTRAMUSCULAR | Status: DC

## 2012-03-15 MED ORDER — OXYCODONE-ACETAMINOPHEN 5-325 MG PO TABS: 1.0000 | ORAL_TABLET | ORAL | Status: AC | PRN

## 2012-03-15 NOTE — ED Provider Notes (Signed)
History   This chart was scribed for Hilario Quarry, MD by Shari Heritage. The patient was seen in room APA19/APA19. Patient's care was started at 1323.     CSN: [id number]  Arrival date & time 03/15/12  1323   First MD Initiated Contact with Patient 03/15/12 1343      Chief Complaint  Patient presents with  . Abdominal Pain    (Consider location/radiation/quality/duration/timing/severity/associated sxs/prior treatment) The history is provided by the patient. No language interpreter was used.   Cassandra Lucas is a 36 y.o. female who presents to the Emergency Department complaining of moderate to severe, constant right-sided back pain above her kidney with a associated nausea. Patient was seen on 03/07/12 for a left-sided kidney stone with associated abdominal pain. Patient was given pain medication which relieved her pain moderately. Patient's discharge instructions were to follow up with Dr. Renda Rolls. Patient began to feel worse in the past couple of days with new pain right-sided back pain. Patient says left-sided pain has improved. Patient has taken Keflex 4x for 7 days. Patient is also taking Percocet 5-325 2x for chronic back pain. Patient takes Cymbalta 1x day and Robaxin 2x day. Patient is a current smoker. Patient with surgical h/o hysterectomy, kidney stones, C-section, gall bladder, thyroidectomy, and breast cyst excision.  PCP - [facility] (Appointment on 03/18/2012)  Past Medical History  Diagnosis Date  . Productive cough     smoker  . Anxiety   . Anemia   . Renal disorder   . Kidney stones     Past Surgical History  Procedure Date  . Thyroidectomy, partial 2012    [facility], [facility]  . Laparoscopic cholecystectomy 2002    [facility]  . Breast cyst excision   . Cesarean section 2006    [facility]  . Laparoscopic hysterectomy 04/12/2011    Procedure: HYSTERECTOMY TOTAL LAPAROSCOPIC;  Surgeon: Lazaro Arms, MD;  Location: [facility];  Service: Gynecology;  Laterality:  N/A;  . Abdominal hysterectomy     Family History  Problem Relation Age of Onset  . Cancer Mother   . Diabetes Father   . Heart failure Father     History  Substance Use Topics  . Smoking status: Current Everyday Smoker -- 1.0 packs/day for 17 years    Types: Cigarettes  . Smokeless tobacco: Not on file  . Alcohol Use: No    OB History    Grav Para Term Preterm Abortions TAB SAB Ect Mult Living   4 4 0 0 0 0 0 0 0 4       Review of Systems A complete 10 system review of systems was obtained and all systems are negative except as noted in the HPI and PMH.   Allergies  Nsaids and Aspirin  Home Medications   Current Outpatient Rx  Name Route Sig Dispense Refill  . CEPHALEXIN 500 MG PO CAPS Oral Take 500 mg by mouth 4 (four) times daily.    . DULOXETINE HCL 30 MG PO CPEP Oral Take 30 mg by mouth daily.    Marland Kitchen METHOCARBAMOL 750 MG PO TABS Oral Take 750 mg by mouth 2 (two) times daily.    . OXYCODONE-ACETAMINOPHEN 5-325 MG PO TABS Oral Take 1 tablet by mouth every 4 (four) hours as needed.    Marland Kitchen PRESCRIPTION MEDICATION Oral Take 0.1 mg by mouth at bedtime. For relaxation and sleep. Cannot remember name but it is similar to valium      BP 134/98  Pulse  105  Temp 98.3 F (36.8 C) (Oral)  Resp 20  Ht 4\' 11"  (1.499 m)  Wt 189 lb (85.73 kg)  BMI 38.17 kg/m2  SpO2 96%  LMP 01/22/2011  Physical Exam  Nursing note and vitals reviewed. Constitutional: She is oriented to person, place, and time. She appears well-developed and well-nourished.  HENT:  Head: Normocephalic and atraumatic.  Right Ear: External ear normal.  Left Ear: External ear normal.  Eyes: Conjunctivae and EOM are normal. Pupils are equal, round, and reactive to light.  Neck: Normal range of motion. Neck supple.  Cardiovascular: Normal rate, regular rhythm and intact distal pulses.        HR is 90 to auscultation.  Pulmonary/Chest: Effort normal and breath sounds normal.  Abdominal: Soft. Bowel sounds are  normal. There is tenderness (Small amount of suprapubic tenderness to palpation.).  Musculoskeletal: Normal range of motion.       Bilateral CVA tenderness - right greater than left.   Neurological: She is alert and oriented to person, place, and time. She has normal strength. No sensory deficit. Cranial nerve deficit:  no gross defecits noted. She displays no seizure activity.  Skin: Skin is warm and dry.  Psychiatric: She has a normal mood and affect.    ED Course  Procedures (including critical care time) DIAGNOSTIC STUDIES: Oxygen Saturation is 96% on room air, adequate by my interpretation.    COORDINATION OF CARE: 1:50PM-  Patient informed of current plan for treatment and evaluation and agrees with plan at this time.   3:30PM- Performed consult with Dr. Jerre Simon. Patient's case was discussed.  3:32PM- Updated patient on her case. Informed patient that I consulted with Dr. Jerre Simon who said he could see her on Monday. Originally ordered Dilaudid, but patient reports that it gives her a HA. Have ordered 2 tablets of Percocet 5-325mg .   Results for orders placed during the hospital encounter of 03/15/12  URINALYSIS, ROUTINE W REFLEX MICROSCOPIC      Component Value Range   Color, Urine YELLOW  YELLOW   APPearance HAZY (*) CLEAR   Specific Gravity, Urine 1.020  1.005 - 1.030   pH 6.0  5.0 - 8.0   Glucose, UA NEGATIVE  NEGATIVE mg/dL   Hgb urine dipstick SMALL (*) NEGATIVE   Bilirubin Urine NEGATIVE  NEGATIVE   Ketones, ur NEGATIVE  NEGATIVE mg/dL   Protein, ur NEGATIVE  NEGATIVE mg/dL   Urobilinogen, UA 0.2  0.0 - 1.0 mg/dL   Nitrite NEGATIVE  NEGATIVE   Leukocytes, UA NEGATIVE  NEGATIVE  URINE MICROSCOPIC-ADD ON      Component Value Range   Squamous Epithelial / LPF FEW (*) RARE   WBC, UA 0-2  <3 WBC/hpf   RBC / HPF 0-2  <3 RBC/hpf   Bacteria, UA RARE  RARE    Dg Abd 1 View  03/15/2012  *RADIOLOGY REPORT*  Clinical Data: Kidney stone  ABDOMEN - 1 VIEW  Comparison: CT  03/07/2012  Findings: Multiple small renal calculi bilaterally.  Small calcifications in the pelvis bilaterally.  Most of these are phleboliths.  One   could be a stone in the distal left ureter however it  is difficult to correlate the calcifications on the prior CT with the x-Samuell [facility]le to determine which may be a ureteral calculus.  Normal bowel gas pattern.  Surgical clips in the gallbladder fossa and in the pelvis bilaterally.  No acute bony abnormality.  IMPRESSION: Multiple small bilateral renal calculi.  Bilateral pelvic calcifications.  One  of these could be in the ureter however it  is difficult to correlate the calcifications with the findings on CT.  Original Report Authenticated By: Camelia Phenes, M.D.      1. Ureteral colic       MDM  Patient's care discussed with Dr. Jerre Simon and he will see in follow up on Monday at 1000 a.m.  Patient without significant growth in urine from previous culture  Wbc 0-2 today with rare bacteria.  Patient completed previous keflex.  Give obstructing stone, plan cipro, continue pain meds and antiemetics prn.  Patient without any vomiting but some nausea.  Patient advised to return if any worsening symptoms or unable ot keep meds down or control pain.     I personally performed the services described in this documentation, which was scribed in my presence. The recorded information has been reviewed and considered.    Hilario Quarry, MD 03/15/12 [phone]

## 2012-03-15 NOTE — ED Notes (Signed)
Pain low abd , and both kidneys, Nausea,  Hx of kidney stones. Unable to get appt with urologist.

## 2012-03-15 NOTE — ED Notes (Signed)
MD at bedside. 

## 2012-03-15 NOTE — ED Notes (Signed)
Verbalized understanding of dc instructions.

## 2012-03-15 NOTE — ED Notes (Signed)
Pt refused Dilaudid IM, pt states that last time it gave her a HA for 2 days

## 2012-03-15 NOTE — Discharge Instructions (Signed)

## 2012-03-18 ENCOUNTER — Other Ambulatory Visit (HOSPITAL_COMMUNITY): Payer: Self-pay | Admitting: Urology

## 2012-03-18 DIAGNOSIS — N201 Calculus of ureter: Secondary | ICD-10-CM

## 2012-03-18 DIAGNOSIS — N23 Unspecified renal colic: Secondary | ICD-10-CM

## 2012-03-18 DIAGNOSIS — N200 Calculus of kidney: Secondary | ICD-10-CM

## 2012-03-20 ENCOUNTER — Encounter (HOSPITAL_COMMUNITY): Payer: Self-pay

## 2012-03-20 ENCOUNTER — Ambulatory Visit (HOSPITAL_COMMUNITY)
Admission: RE | Admit: 2012-03-20 | Discharge: 2012-03-20 | Disposition: A | Discharge status: Home / Self Care | Payer: Medicaid Other | Source: Ambulatory Visit | Attending: Urology | Admitting: Urology

## 2012-03-20 ENCOUNTER — Ambulatory Visit (HOSPITAL_COMMUNITY): Payer: Medicaid Other

## 2012-03-20 DIAGNOSIS — K5730 Diverticulosis of large intestine without perforation or abscess without bleeding: Secondary | ICD-10-CM | POA: Insufficient documentation

## 2012-03-20 DIAGNOSIS — R109 Unspecified abdominal pain: Secondary | ICD-10-CM | POA: Insufficient documentation

## 2012-03-20 DIAGNOSIS — N200 Calculus of kidney: Secondary | ICD-10-CM | POA: Insufficient documentation

## 2012-03-20 DIAGNOSIS — N23 Unspecified renal colic: Secondary | ICD-10-CM

## 2012-03-20 DIAGNOSIS — N201 Calculus of ureter: Secondary | ICD-10-CM | POA: Insufficient documentation

## 2012-04-03 ENCOUNTER — Ambulatory Visit (HOSPITAL_COMMUNITY): Admission: RE | Admit: 2012-04-03 | Payer: Medicaid Other | Source: Ambulatory Visit

## 2012-09-23 ENCOUNTER — Other Ambulatory Visit (HOSPITAL_COMMUNITY): Payer: Self-pay | Admitting: Family Medicine

## 2012-09-23 ENCOUNTER — Ambulatory Visit (HOSPITAL_COMMUNITY)
Admission: RE | Admit: 2012-09-23 | Discharge: 2012-09-23 | Disposition: A | Discharge status: Home / Self Care | Payer: Medicaid Other | Source: Ambulatory Visit | Attending: Family Medicine | Admitting: Family Medicine

## 2012-09-23 DIAGNOSIS — M069 Rheumatoid arthritis, unspecified: Secondary | ICD-10-CM | POA: Insufficient documentation

## 2012-09-23 DIAGNOSIS — M25549 Pain in joints of unspecified hand: Secondary | ICD-10-CM | POA: Insufficient documentation

## 2012-10-26 ENCOUNTER — Encounter (HOSPITAL_COMMUNITY): Payer: Self-pay

## 2012-10-26 ENCOUNTER — Emergency Department (HOSPITAL_COMMUNITY)
Admission: EM | Admit: 2012-10-26 | Discharge: 2012-10-26 | Disposition: A | Discharge status: Home / Self Care | Payer: Medicaid Other | Attending: Emergency Medicine | Admitting: Emergency Medicine

## 2012-10-26 DIAGNOSIS — Z79899 Other long term (current) drug therapy: Secondary | ICD-10-CM | POA: Insufficient documentation

## 2012-10-26 DIAGNOSIS — R6884 Jaw pain: Secondary | ICD-10-CM | POA: Insufficient documentation

## 2012-10-26 DIAGNOSIS — K089 Disorder of teeth and supporting structures, unspecified: Secondary | ICD-10-CM | POA: Insufficient documentation

## 2012-10-26 DIAGNOSIS — F17200 Nicotine dependence, unspecified, uncomplicated: Secondary | ICD-10-CM | POA: Insufficient documentation

## 2012-10-26 DIAGNOSIS — K0889 Other specified disorders of teeth and supporting structures: Secondary | ICD-10-CM

## 2012-10-26 DIAGNOSIS — Z87442 Personal history of urinary calculi: Secondary | ICD-10-CM | POA: Insufficient documentation

## 2012-10-26 DIAGNOSIS — Z862 Personal history of diseases of the blood and blood-forming organs and certain disorders involving the immune mechanism: Secondary | ICD-10-CM | POA: Insufficient documentation

## 2012-10-26 DIAGNOSIS — F411 Generalized anxiety disorder: Secondary | ICD-10-CM | POA: Insufficient documentation

## 2012-10-26 DIAGNOSIS — R220 Localized swelling, mass and lump, head: Secondary | ICD-10-CM | POA: Insufficient documentation

## 2012-10-26 MED ORDER — OXYCODONE-ACETAMINOPHEN 5-325 MG PO TABS: ORAL_TABLET | ORAL | Status: DC

## 2012-10-26 MED ORDER — MORPHINE SULFATE 4 MG/ML IJ SOLN
6.0000 mg | Freq: Once | INTRAMUSCULAR | Status: AC
  Administered 2012-10-26: 6 mg via INTRAMUSCULAR
  Filled 2012-10-26 (×2): qty 1

## 2012-10-26 MED ORDER — ONDANSETRON HCL 4 MG PO TABS
4.0000 mg | ORAL_TABLET | Freq: Once | ORAL | Status: AC
  Administered 2012-10-26: 4 mg via ORAL
  Filled 2012-10-26: qty 1

## 2012-10-26 MED ORDER — DEXAMETHASONE SODIUM PHOSPHATE 4 MG/ML IJ SOLN
8.0000 mg | Freq: Once | INTRAMUSCULAR | Status: AC
  Administered 2012-10-26: 8 mg via INTRAMUSCULAR
  Filled 2012-10-26: qty 3

## 2012-10-26 MED ORDER — AMOXICILLIN 500 MG PO CAPS: 500.0000 mg | ORAL_CAPSULE | Freq: Three times a day (TID) | ORAL | Status: DC

## 2012-10-26 MED ORDER — AMOXICILLIN 250 MG PO CAPS
1000.0000 mg | ORAL_CAPSULE | Freq: Once | ORAL | Status: AC
  Administered 2012-10-26: 1000 mg via ORAL
  Filled 2012-10-26: qty 4

## 2012-10-26 NOTE — ED Notes (Signed)
[place] PA in room at present time

## 2012-10-26 NOTE — ED Provider Notes (Signed)
Medical screening examination/treatment/procedure(s) were performed by non-physician practitioner and as supervising physician I was immediately available for consultation/collaboration.   Carleene Cooper III, MD 10/26/12 [phone]

## 2012-10-26 NOTE — ED Notes (Signed)
Left side dental pain x2 days

## 2012-10-26 NOTE — ED Provider Notes (Signed)
History     CSN: [id number]  Arrival date & time 10/26/12  1033   First MD Initiated Contact with Patient 10/26/12 1103      Chief Complaint  Patient presents with  . Dental Pain    (Consider location/radiation/quality/duration/timing/severity/associated sxs/prior treatment) Patient is a 37 y.o. female presenting with tooth pain. The history is provided by the patient.  Dental PainThe primary symptoms include mouth pain. Primary symptoms do not include shortness of breath or cough. The symptoms began 2 days ago. The symptoms are worsening. The symptoms occur frequently.  Additional symptoms include: gum swelling and jaw pain. Additional symptoms do not include: nosebleeds. Medical issues include: smoking.    Past Medical History  Diagnosis Date  . Productive cough     smoker  . Anxiety   . Anemia   . Renal disorder   . Kidney stones     Past Surgical History  Procedure Laterality Date  . Thyroidectomy, partial  2012    [facility]  . Laparoscopic cholecystectomy  2002    [facility]  . Breast cyst excision    . Cesarean section  2006    [facility]  . Laparoscopic hysterectomy  04/12/2011    Procedure: HYSTERECTOMY TOTAL LAPAROSCOPIC;  Surgeon: Lazaro Arms, MD;  Location: [facility];  Service: Gynecology;  Laterality: N/A;  . Abdominal hysterectomy      Family History  Problem Relation Age of Onset  . Cancer Mother   . Diabetes Father   . Heart failure Father     History  Substance Use Topics  . Smoking status: Current Every Day Smoker -- 1.00 packs/day for 17 years    Types: Cigarettes  . Smokeless tobacco: Not on file  . Alcohol Use: No    OB History   Grav Para Term Preterm Abortions TAB SAB Ect Mult Living   4 4 0 0 0 0 0 0 0 4       Review of Systems  Constitutional: Negative for activity change.       All ROS Neg except as noted in HPI  HENT: Positive for dental problem. Negative for nosebleeds and neck pain.   Eyes: Negative for photophobia and discharge.   Respiratory: Negative for cough, shortness of breath and wheezing.   Cardiovascular: Negative for chest pain and palpitations.  Gastrointestinal: Negative for abdominal pain and blood in stool.  Genitourinary: Negative for dysuria, frequency and hematuria.  Musculoskeletal: Negative for back pain and arthralgias.  Skin: Negative.   Neurological: Negative for dizziness, seizures and speech difficulty.  Psychiatric/Behavioral: Negative for hallucinations and confusion. The patient is nervous/anxious.     Allergies  Nsaids and Aspirin  Home Medications   Current Outpatient Rx  Name  Route  Sig  Dispense  Refill  . DULoxetine (CYMBALTA) 30 MG capsule   Oral   Take 30 mg by mouth daily.           BP 135/82  Pulse 110  Temp(Src) 98.4 F (36.9 C) (Oral)  Resp 20  Wt 204 lb (92.534 kg)  BMI 41.18 kg/m2  SpO2 100%  LMP 01/22/2011  Physical Exam  Nursing note and vitals reviewed. Constitutional: She is oriented to person, place, and time. She appears well-developed and well-nourished.  Non-toxic appearance.  HENT:  Head: Normocephalic.  Right Ear: Tympanic membrane and external ear normal.  Left Ear: Tympanic membrane and external ear normal.  Left lower wisdom tooth is erupting. Mod swelling at the gum. NO abscess. Airway patent. No swelling  under the tongue.  Eyes: EOM and lids are normal. Pupils are equal, round, and reactive to light.  Neck: Normal range of motion. Neck supple. Carotid bruit is not present.  Cardiovascular: Regular rhythm, normal heart sounds, intact distal pulses and normal pulses.  Tachycardia present.   Pulmonary/Chest: Breath sounds normal. No respiratory distress.  Few scattered wheezes and rhonchi  Abdominal: Soft. Bowel sounds are normal. There is no tenderness. There is no guarding.  Musculoskeletal: Normal range of motion.  Lymphadenopathy:       Head (right side): No submandibular adenopathy present.       Head (left side): No submandibular  adenopathy present.    She has no cervical adenopathy.  Neurological: She is alert and oriented to person, place, and time. She has normal strength. No cranial nerve deficit or sensory deficit.  Skin: Skin is warm and dry.  Psychiatric: She has a normal mood and affect. Her speech is normal.    ED Course  Procedures (including critical care time)  Labs Reviewed - No data to display No results found.   No diagnosis found.    MDM  I have reviewed nursing notes, vital signs, and all appropriate lab and imaging results for this patient. Pt has an erupting wisdom tooth. Pt advised to see a dentist as soon as possible. Rx for Amoxil, and percocet given to the patient. Amoxil and morphine and decadron given in the dept.       Kathie Dike, [place] 10/26/12 1136

## 2012-10-26 NOTE — ED Notes (Signed)
Pt c/o left side toothache that started two days ago, denies any injury to tooth or previous hx of problems with any teeth on left side

## 2013-05-06 ENCOUNTER — Emergency Department (HOSPITAL_COMMUNITY)
Admission: EM | Admit: 2013-05-06 | Discharge: 2013-05-07 | Disposition: A | Discharge status: Home / Self Care | Payer: Medicaid Other | Attending: Emergency Medicine | Admitting: Emergency Medicine

## 2013-05-06 ENCOUNTER — Encounter (HOSPITAL_COMMUNITY): Payer: Self-pay | Admitting: *Deleted

## 2013-05-06 DIAGNOSIS — E860 Dehydration: Secondary | ICD-10-CM | POA: Insufficient documentation

## 2013-05-06 DIAGNOSIS — I951 Orthostatic hypotension: Secondary | ICD-10-CM | POA: Insufficient documentation

## 2013-05-06 DIAGNOSIS — M542 Cervicalgia: Secondary | ICD-10-CM | POA: Insufficient documentation

## 2013-05-06 DIAGNOSIS — F17200 Nicotine dependence, unspecified, uncomplicated: Secondary | ICD-10-CM | POA: Insufficient documentation

## 2013-05-06 DIAGNOSIS — R51 Headache: Secondary | ICD-10-CM | POA: Insufficient documentation

## 2013-05-06 DIAGNOSIS — R000 Tachycardia, unspecified: Secondary | ICD-10-CM | POA: Insufficient documentation

## 2013-05-06 DIAGNOSIS — Z79899 Other long term (current) drug therapy: Secondary | ICD-10-CM | POA: Insufficient documentation

## 2013-05-06 DIAGNOSIS — Z862 Personal history of diseases of the blood and blood-forming organs and certain disorders involving the immune mechanism: Secondary | ICD-10-CM | POA: Insufficient documentation

## 2013-05-06 DIAGNOSIS — F411 Generalized anxiety disorder: Secondary | ICD-10-CM | POA: Insufficient documentation

## 2013-05-06 DIAGNOSIS — R05 Cough: Secondary | ICD-10-CM | POA: Insufficient documentation

## 2013-05-06 DIAGNOSIS — M549 Dorsalgia, unspecified: Secondary | ICD-10-CM | POA: Insufficient documentation

## 2013-05-06 DIAGNOSIS — R059 Cough, unspecified: Secondary | ICD-10-CM | POA: Insufficient documentation

## 2013-05-06 DIAGNOSIS — Z87448 Personal history of other diseases of urinary system: Secondary | ICD-10-CM | POA: Insufficient documentation

## 2013-05-06 DIAGNOSIS — Z87442 Personal history of urinary calculi: Secondary | ICD-10-CM | POA: Insufficient documentation

## 2013-05-06 DIAGNOSIS — G8929 Other chronic pain: Secondary | ICD-10-CM | POA: Insufficient documentation

## 2013-05-06 LAB — CBC WITH DIFFERENTIAL/PLATELET
Basophils Relative: 0 % (ref 0–1)
Eosinophils Absolute: 0.3 10*3/uL (ref 0.0–0.7)
MCH: 32.1 pg (ref 26.0–34.0)
MCHC: 34.9 g/dL (ref 30.0–36.0)
Monocytes Relative: 6 % (ref 3–12)
Neutrophils Relative %: 57 % (ref 43–77)
Platelets: 246 10*3/uL (ref 150–400)
RDW: 12.6 % (ref 11.5–15.5)

## 2013-05-06 NOTE — ED Notes (Addendum)
Dizzy for 2days,  Hurts "all over",  Pain rt forearm.  Hands go "numb " at times.  Pt 's sister recently dx with MS. Pt says she is having the same sx.

## 2013-05-06 NOTE — ED Provider Notes (Signed)
CSN: [id number]     Arrival date & time 05/06/13  2224 History  This chart was scribed for Ward Givens, MD by Bennett Scrape, ED Scribe and Danella Maiers, ED scribe. This patient was seen in room APA07/APA07 and the patient's care was started at 11:30 PM.   Chief Complaint  Patient presents with  . Dizziness    The history is provided by the patient. No language interpreter was used.    HPI Comments: Cassandra Lucas is a 37 y.o. female who presents to the Emergency Department complaining of intermittent lightheadedness in the last 2 days described as a "rush to her head" and a feeling like she's going to pass out. She describes seeing "black dot", blurred vision, and weakness. Symptoms is brought on by standing for example one episode occurred in the shower tonight just PTA. Pt reports that laying down seems to help the symptoms subside. She admits that at baseline she feels "bad" due to her chronic neck and back pain. Pt denies nausea, vomiting, diarrhea. She denies shortness of breath, chest pain, cough, palpitations or fever she states she had her headache earlier in the day that she  relates to her chronic neck and back pain. She denies any recent changes in diet or medication. Pt denies that he symptoms are from her anxiety stating that she used to never leave the house, but she has been leaving the house for church recently. She denies feeling depressed stating that she stopped taking cymbalta with approval from her PCP four months ago. She states that she stopped taking it, because it made her sleepy. She takes gabapentin 2x per day and percocet x3 per day for fibromyalgia. She takes Ativan PRN as a sleep aid. She is a 1ppd smoker.  She also complains of intermittent left-sided headaches for the past 2 days. The headaches are located at the top left and back of the head. Most recent episode started last 2 hours.  Lives with children.  PCP Dr Janna Arch  Past Medical History   Diagnosis Date  . Productive cough     smoker  . Anxiety   . Anemia   . Renal disorder   . Kidney stones    Past Surgical History  Procedure Laterality Date  . Thyroidectomy, partial  2012    [facility], [facility]  . Laparoscopic cholecystectomy  2002    [facility]  . Cesarean section  2006    [facility]  . Laparoscopic hysterectomy  04/12/2011    Procedure: HYSTERECTOMY TOTAL LAPAROSCOPIC;  Surgeon: Lazaro Arms, MD;  Location: [facility];  Service: Gynecology;  Laterality: N/A;  . Abdominal hysterectomy    . Breast cyst excision     Family History  Problem Relation Age of Onset  . Cancer Mother   . Diabetes Father   . Heart failure Father    History  Substance Use Topics  . Smoking status: Current Every Day Smoker -- 1.00 packs/day for 17 years    Types: Cigarettes  . Smokeless tobacco: Not on file  . Alcohol Use: No  Unemployed Not on disability, filing for chronic pain  OB History   Grav Para Term Preterm Abortions TAB SAB Ect Mult Living   4 4 0 0 0 0 0 0 0 4      Review of Systems  HENT: Positive for neck pain (chronic).   Respiratory: Positive for cough (chronic).   Gastrointestinal: Negative for nausea, vomiting and diarrhea.  Musculoskeletal: Positive for back pain (chronic).  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 Negative for syncope.  All other systems reviewed and are negative.    Allergies  Nsaids and Aspirin  Home Medications   Current Outpatient Rx  Name  Route  Sig  Dispense  Refill  . albuterol (PROVENTIL HFA;VENTOLIN HFA) 108 (90 BASE) MCG/ACT inhaler   Inhalation   Inhale 2 puffs into the lungs every 6 (six) hours as needed for wheezing or shortness of breath.         . gabapentin (NEURONTIN) 300 MG capsule   Oral   Take 300 mg by mouth 2 (two) times daily.         Marland Kitchen LORazepam (ATIVAN) 1 MG tablet   Oral   Take 1 mg by mouth at bedtime.         Marland Kitchen oxyCODONE-acetaminophen (PERCOCET/ROXICET)  5-325 MG per tablet   Oral   Take 1 tablet by mouth 3 (three) times daily.           Triage Vitals: BP 129/94  Pulse 130  Temp(Src) 98.9 F (37.2 C) (Oral)  Resp 20  Ht 4\' 10"  (1.473 m)  Wt 208 lb (94.348 kg)  BMI 43.48 kg/m2  SpO2 100%  LMP 01/22/2011  Vital signs normal   Orthostatic vital signs show tachycardia without hypotension   Physical Exam  Nursing note and vitals reviewed. Constitutional: She is oriented to person, place, and time. She appears well-developed and well-nourished.  Non-toxic appearance. She does not appear ill. No distress.  obese  HENT:  Head: Normocephalic and atraumatic.  Right Ear: External ear normal.  Left Ear: External ear normal.  Nose: Nose normal. No mucosal edema or rhinorrhea.  Mouth/Throat: No dental abscesses or edematous.  Tongue is dry.  Eyes: Conjunctivae and EOM are normal. Pupils are equal, round, and reactive to light.  Neck: Normal range of motion and full passive range of motion without pain. Neck supple.  Cardiovascular: Normal rate, regular rhythm and normal heart sounds.  Exam reveals no gallop and no friction rub.   No murmur heard. Pulmonary/Chest: Effort normal and breath sounds normal. No respiratory distress. She has no wheezes. She has no rhonchi. She has no rales. She exhibits no tenderness and no crepitus.  Abdominal: Soft. Normal appearance and bowel sounds are normal. She exhibits no distension. There is no tenderness. There is no rebound and no guarding.  Musculoskeletal: Normal range of motion. She exhibits no edema and no tenderness.  Moves all extremities well.   Neurological: She is alert and oriented to person, place, and time. She has normal strength. No cranial nerve deficit.  Skin: Skin is warm, dry and intact. No rash noted. No erythema. No pallor.  Psychiatric: Her speech is normal and behavior is normal. Her mood appears not anxious.  Flat affect    ED Course    DIAGNOSTIC STUDIES: Oxygen  Saturation is 100% on room air, normal by my interpretation.    COORDINATION OF CARE: 11:43 PM-Advised pt that constant rest can make chronic pain worse. Discussed treatment plan which includes CBC panel, CMP and UA with pt at bedside and pt agreed to plan.   01:00 after reviewing her orthostatic vital signs IV fluids were ordered however patient declined. She states she preferred to drink fluids. We discussed that she drinks a lot of caffeinated drinks which will keep her more dehydrated. She should drink more sports drinks. Patient inquires if her "MS test" was positive. I explained to her there is  no large test for MS. She states her sister was recently diagnosed with it" we had the same symptoms". She is advised to followup with her doctor for further evaluation.  Procedures (including critical care time)  Results for orders placed during the hospital encounter of 05/06/13  CBC WITH DIFFERENTIAL      Result Value Range   WBC 8.7  4.0 - 10.5 K/uL   RBC 4.43  3.87 - 5.11 MIL/uL   Hemoglobin 14.2  12.0 - 15.0 g/dL   HCT 16.1  09.6 - 04.5 %   MCV 91.9  78.0 - 100.0 fL   MCH 32.1  26.0 - 34.0 pg   MCHC 34.9  30.0 - 36.0 g/dL   RDW 40.9  81.1 - 91.4 %   Platelets 246  150 - 400 K/uL   Neutrophils Relative % 57  43 - 77 %   Neutro Abs 5.0  1.7 - 7.7 K/uL   Lymphocytes Relative 33  12 - 46 %   Lymphs Abs 2.8  0.7 - 4.0 K/uL   Monocytes Relative 6  3 - 12 %   Monocytes Absolute 0.5  0.1 - 1.0 K/uL   Eosinophils Relative 4  0 - 5 %   Eosinophils Absolute 0.3  0.0 - 0.7 K/uL   Basophils Relative 0  0 - 1 %   Basophils Absolute 0.0  0.0 - 0.1 K/uL  COMPREHENSIVE METABOLIC PANEL      Result Value Range   Sodium 136  135 - 145 mEq/L   Potassium 3.9  3.5 - 5.1 mEq/L   Chloride 102  96 - 112 mEq/L   CO2 27  19 - 32 mEq/L   Glucose, Bld 114 (*) 70 - 99 mg/dL   BUN 10  6 - 23 mg/dL   Creatinine, Ser 7.82  0.50 - 1.10 mg/dL   Calcium 9.1  8.4 - 95.6 mg/dL   Total Protein 6.8  6.0 - 8.3 g/dL    Albumin 3.5  3.5 - 5.2 g/dL   AST 15  0 - 37 U/L   ALT 18  0 - 35 U/L   Alkaline Phosphatase 76  39 - 117 U/L   Total Bilirubin <0.1 (*) 0.3 - 1.2 mg/dL   GFR calc non Af Amer >90  >90 mL/min   GFR calc Af Amer >90  >90 mL/min  URINALYSIS, ROUTINE W REFLEX MICROSCOPIC      Result Value Range   Color, Urine YELLOW  YELLOW   APPearance CLEAR  CLEAR   Specific Gravity, Urine 1.020  1.005 - 1.030   pH 6.0  5.0 - 8.0   Glucose, UA NEGATIVE  NEGATIVE mg/dL   Hgb urine dipstick SMALL (*) NEGATIVE   Bilirubin Urine NEGATIVE  NEGATIVE   Ketones, ur NEGATIVE  NEGATIVE mg/dL   Protein, ur NEGATIVE  NEGATIVE mg/dL   Urobilinogen, UA 0.2  0.0 - 1.0 mg/dL   Nitrite NEGATIVE  NEGATIVE   Leukocytes, UA NEGATIVE  NEGATIVE  URINE MICROSCOPIC-ADD ON      Result Value Range   Squamous Epithelial / LPF FEW (*) RARE   RBC / HPF 3-6  <3 RBC/hpf   Bacteria, UA FEW (*) RARE   Urine-Other MUCOUS PRESENT     Laboratory interpretation all normal .   Date: 05/07/2013  Rate: 108  Rhythm: sinus tachycardia  QRS Axis: normal  Intervals: normal  ST/T Wave abnormalities: normal  Conduction Disutrbances:none  Narrative Interpretation:   Old EKG Reviewed: none available  1. Orthostasis   2. Tachycardia   3. Dehydration     Plan discharge   Devoria Albe, MD, FACEP    MDM    I personally performed the services described in this documentation, which was scribed in my presence. The recorded information has been reviewed and considered.  Devoria Albe, MD, FACEP    Ward Givens, MD 05/07/13 [phone]

## 2013-05-07 LAB — COMPREHENSIVE METABOLIC PANEL
Albumin: 3.5 g/dL (ref 3.5–5.2)
Alkaline Phosphatase: 76 U/L (ref 39–117)
BUN: 10 mg/dL (ref 6–23)
Potassium: 3.9 mEq/L (ref 3.5–5.1)
Sodium: 136 mEq/L (ref 135–145)
Total Protein: 6.8 g/dL (ref 6.0–8.3)

## 2013-05-07 LAB — URINALYSIS, ROUTINE W REFLEX MICROSCOPIC
Glucose, UA: NEGATIVE mg/dL
Ketones, ur: NEGATIVE mg/dL
Leukocytes, UA: NEGATIVE
pH: 6 (ref 5.0–8.0)

## 2013-05-07 LAB — URINE MICROSCOPIC-ADD ON

## 2013-05-07 MED ORDER — SODIUM CHLORIDE 0.9 % IV SOLN: 1000.0000 mL | INTRAVENOUS | Status: DC

## 2013-05-07 MED ORDER — SODIUM CHLORIDE 0.9 % IV SOLN: 1000.0000 mL | Freq: Once | INTRAVENOUS | Status: DC

## 2013-05-07 NOTE — ED Notes (Signed)
Patient ambulatory to restroom with steady gait, clean catch instructions given and advised pt to bring specimen back to room as well.  

## 2013-07-17 ENCOUNTER — Emergency Department (HOSPITAL_COMMUNITY)
Admission: EM | Admit: 2013-07-17 | Discharge: 2013-07-17 | Disposition: A | Discharge status: Home / Self Care | Payer: Medicaid Other | Attending: Emergency Medicine | Admitting: Emergency Medicine

## 2013-07-17 ENCOUNTER — Encounter (HOSPITAL_COMMUNITY): Payer: Self-pay | Admitting: Emergency Medicine

## 2013-07-17 DIAGNOSIS — R000 Tachycardia, unspecified: Secondary | ICD-10-CM | POA: Insufficient documentation

## 2013-07-17 DIAGNOSIS — F411 Generalized anxiety disorder: Secondary | ICD-10-CM | POA: Insufficient documentation

## 2013-07-17 DIAGNOSIS — N200 Calculus of kidney: Secondary | ICD-10-CM | POA: Insufficient documentation

## 2013-07-17 DIAGNOSIS — Z862 Personal history of diseases of the blood and blood-forming organs and certain disorders involving the immune mechanism: Secondary | ICD-10-CM | POA: Insufficient documentation

## 2013-07-17 DIAGNOSIS — R319 Hematuria, unspecified: Secondary | ICD-10-CM

## 2013-07-17 DIAGNOSIS — Z79899 Other long term (current) drug therapy: Secondary | ICD-10-CM | POA: Insufficient documentation

## 2013-07-17 DIAGNOSIS — R109 Unspecified abdominal pain: Secondary | ICD-10-CM

## 2013-07-17 DIAGNOSIS — F17200 Nicotine dependence, unspecified, uncomplicated: Secondary | ICD-10-CM | POA: Insufficient documentation

## 2013-07-17 LAB — URINALYSIS, ROUTINE W REFLEX MICROSCOPIC
Bilirubin Urine: NEGATIVE
Ketones, ur: NEGATIVE mg/dL
Nitrite: NEGATIVE
Protein, ur: NEGATIVE mg/dL
Specific Gravity, Urine: >1.03 — ABNORMAL HIGH (ref 1.005–1.030)
Urobilinogen, UA: 0.2 mg/dL (ref 0.0–1.0)

## 2013-07-17 MED ORDER — TAMSULOSIN HCL 0.4 MG PO CAPS: 0.4000 mg | ORAL_CAPSULE | Freq: Every day | ORAL | Status: DC

## 2013-07-17 MED ORDER — HYDROCODONE-ACETAMINOPHEN 5-325 MG PO TABS
2.0000 | ORAL_TABLET | Freq: Once | ORAL | Status: AC
  Administered 2013-07-17: 2 via ORAL
  Filled 2013-07-17: qty 2

## 2013-07-17 MED ORDER — HYDROCODONE-ACETAMINOPHEN 5-325 MG PO TABS: 2.0000 | ORAL_TABLET | ORAL | Status: DC | PRN

## 2013-07-17 MED ORDER — MORPHINE SULFATE 4 MG/ML IJ SOLN: 6.0000 mg | Freq: Once | INTRAMUSCULAR | Status: DC | PRN

## 2013-07-17 MED ORDER — ONDANSETRON 4 MG PO TBDP: ORAL_TABLET | ORAL | Status: DC

## 2013-07-17 MED ORDER — ONDANSETRON 4 MG PO TBDP
4.0000 mg | ORAL_TABLET | Freq: Once | ORAL | Status: AC
  Administered 2013-07-17: 4 mg via ORAL
  Filled 2013-07-17: qty 1

## 2013-07-17 NOTE — ED Notes (Signed)
Right lower back and flank pain times 4 days. Nausea.

## 2013-07-17 NOTE — Discharge Instructions (Signed)
For severe pain take norco or vicodin however realize they have the potential for addiction and it can make you sleepy and has tylenol in it.  No operating machinery while taking. Take zofran for nausea. If you were given medicines take as directed.  If you are on coumadin or contraceptives realize their levels and effectiveness is altered by many different medicines.  If you have any reaction (rash, tongues swelling, other) to the medicines stop taking and see a physician.   Please follow up as directed and return to the ER or see a physician for new or worsening symptoms.  Thank you.

## 2013-07-17 NOTE — ED Provider Notes (Signed)
CSN: [id number]     Arrival date & time 07/17/13  1753 History   First MD Initiated Contact with Patient 07/17/13 1805     Chief Complaint  Patient presents with  . Flank Pain   (Consider location/radiation/quality/duration/timing/severity/associated sxs/prior Treatment) HPI Comments: 37 yo female with smoking and multiple kidney stone hx (20 in the past, has not required procedures) presents with recurrent right flank pain with nausea for 5 days.  Pt has known stones, CT in the past. No fevers.  Tolerating po.  Improves with time. NSAID allergy.    The history is provided by the patient.    Past Medical History  Diagnosis Date  . Productive cough     smoker  . Anxiety   . Anemia   . Renal disorder   . Kidney stones    Past Surgical History  Procedure Laterality Date  . Thyroidectomy, partial  2012    [facility]  . Laparoscopic cholecystectomy  2002    [facility]  . [facility] section  2006    [facility]  . Laparoscopic hysterectomy  04/12/2011    Procedure: HYSTERECTOMY TOTAL LAPAROSCOPIC;  Surgeon: Lazaro Arms, MD;  Location: [facility];  Service: Gynecology;  Laterality: N/A;  . Abdominal hysterectomy    . Breast cyst excision     Family History  Problem Relation Age of Onset  . Cancer Mother   . Diabetes Father   . Heart failure Father    History  Substance Use Topics  . Smoking status: Current Every Day Smoker -- 1.00 packs/day for 17 years    Types: Cigarettes  . Smokeless tobacco: Not on file  . Alcohol Use: No   OB History   Grav Para Term Preterm Abortions TAB SAB Ect Mult Living   4 4 0 0 0 0 0 0 0 4      Review of Systems  Constitutional: Positive for appetite change. Negative for fever and chills.  HENT: Negative for congestion.   Eyes: Negative for visual disturbance.  Respiratory: Negative for shortness of breath.   Cardiovascular: Negative for chest pain.  Gastrointestinal: Positive for nausea. Negative for vomiting and abdominal pain.  Genitourinary:  Positive for flank pain. Negative for dysuria.  Musculoskeletal: Negative for back pain, neck pain and neck stiffness.  Skin: Negative for rash.  Neurological: Negative for light-headedness and headaches.    Allergies  Nsaids and Aspirin  Home Medications   Current Outpatient Rx  Name  Route  Sig  Dispense  Refill  . albuterol (PROVENTIL HFA;VENTOLIN HFA) 108 (90 BASE) MCG/ACT inhaler   Inhalation   Inhale 2 puffs into the lungs every 6 (six) hours as needed for wheezing or shortness of breath.         . gabapentin (NEURONTIN) 300 MG capsule   Oral   Take 300 mg by mouth 2 (two) times daily.         Marland Kitchen LORazepam (ATIVAN) 1 MG tablet   Oral   Take 1 mg by mouth at bedtime.         Marland Kitchen oxyCODONE-acetaminophen (PERCOCET/ROXICET) 5-325 MG per tablet   Oral   Take 1 tablet by mouth 3 (three) times daily.           BP 128/91  Pulse 111  Temp(Src) 98.8 F (37.1 C) (Oral)  Resp 20  Ht 4\' 11"  (1.499 m)  Wt 201 lb 5 oz (91.315 kg)  BMI 40.64 kg/m2  SpO2 98%  LMP 01/22/2011 Physical Exam  Nursing note  and vitals reviewed. Constitutional: She is oriented to person, place, and time. She appears well-developed and well-nourished.  HENT:  Head: Normocephalic and atraumatic.  Mild dry mm  Eyes: Conjunctivae are normal. Right eye exhibits no discharge. Left eye exhibits no discharge.  Neck: Normal range of motion. Neck supple. No tracheal deviation present.  Cardiovascular: Regular rhythm.  Tachycardia present.   Pulmonary/Chest: Effort normal and breath sounds normal.  Abdominal: Soft. She exhibits no distension. There is no tenderness. There is no guarding.  Musculoskeletal: She exhibits tenderness (mild right flank). She exhibits no edema.  Neurological: She is alert and oriented to person, place, and time.  Skin: Skin is warm. No rash noted.  Psychiatric: She has a normal mood and affect.    ED Course  Procedures (including critical care time) Emergency Focused  Ultrasound Exam Limited retroperitoneal ultrasound of kidneys  Performed and interpreted by Dr. Jodi Mourning Indication: flank pain Focused abdominal ultrasound with both kidneys imaged in transverse and longitudinal planes in real-time. Interpretation: no hydronephrosis visualized.  no stones or cysts visualized  Images archived electronically  Labs Review Labs Reviewed  URINALYSIS, ROUTINE W REFLEX MICROSCOPIC - Abnormal; Notable for the following:    Specific Gravity, Urine >1.030 (*)    Hgb urine dipstick MODERATE (*)    All other components within normal limits  URINE MICROSCOPIC-ADD ON   Imaging Review No results found.  EKG Interpretation   None       MDM  No diagnosis found. Clinically stone vs less likely pyelo. Pain meds and nausea meds with po fluids. Discussed holding on CT with hx of CT and likely stone. Bedside US no hydro.  Fup with urology discussed Pain improved on recheck.   Results and differential diagnosis were discussed with the patient. Close follow up outpatient was discussed, patient comfortable with the plan.   Diagnosis: Right flank pain, Nephrolithiasis   Enid Skeens, MD 07/17/13 2014

## 2013-11-05 ENCOUNTER — Ambulatory Visit: Payer: Self-pay | Admitting: Adult Health

## 2013-11-12 ENCOUNTER — Ambulatory Visit: Payer: Self-pay | Admitting: Adult Health

## 2013-11-15 ENCOUNTER — Emergency Department (HOSPITAL_COMMUNITY): Payer: Medicaid Other

## 2013-11-15 ENCOUNTER — Emergency Department (HOSPITAL_COMMUNITY)
Admission: EM | Admit: 2013-11-15 | Discharge: 2013-11-15 | Disposition: A | Discharge status: Home / Self Care | Payer: Medicaid Other | Attending: Emergency Medicine | Admitting: Emergency Medicine

## 2013-11-15 ENCOUNTER — Encounter (HOSPITAL_COMMUNITY): Payer: Self-pay | Admitting: Emergency Medicine

## 2013-11-15 DIAGNOSIS — R42 Dizziness and giddiness: Secondary | ICD-10-CM | POA: Insufficient documentation

## 2013-11-15 DIAGNOSIS — R0789 Other chest pain: Secondary | ICD-10-CM | POA: Insufficient documentation

## 2013-11-15 DIAGNOSIS — R259 Unspecified abnormal involuntary movements: Secondary | ICD-10-CM | POA: Insufficient documentation

## 2013-11-15 DIAGNOSIS — Z79899 Other long term (current) drug therapy: Secondary | ICD-10-CM | POA: Insufficient documentation

## 2013-11-15 DIAGNOSIS — F17200 Nicotine dependence, unspecified, uncomplicated: Secondary | ICD-10-CM | POA: Insufficient documentation

## 2013-11-15 DIAGNOSIS — Z87448 Personal history of other diseases of urinary system: Secondary | ICD-10-CM | POA: Insufficient documentation

## 2013-11-15 DIAGNOSIS — F411 Generalized anxiety disorder: Secondary | ICD-10-CM | POA: Insufficient documentation

## 2013-11-15 DIAGNOSIS — Z87442 Personal history of urinary calculi: Secondary | ICD-10-CM | POA: Insufficient documentation

## 2013-11-15 DIAGNOSIS — G8929 Other chronic pain: Secondary | ICD-10-CM | POA: Insufficient documentation

## 2013-11-15 DIAGNOSIS — IMO0001 Reserved for inherently not codable concepts without codable children: Secondary | ICD-10-CM | POA: Insufficient documentation

## 2013-11-15 DIAGNOSIS — T50995A Adverse effect of other drugs, medicaments and biological substances, initial encounter: Secondary | ICD-10-CM | POA: Insufficient documentation

## 2013-11-15 DIAGNOSIS — T887XXA Unspecified adverse effect of drug or medicament, initial encounter: Secondary | ICD-10-CM

## 2013-11-15 DIAGNOSIS — M542 Cervicalgia: Secondary | ICD-10-CM | POA: Insufficient documentation

## 2013-11-15 DIAGNOSIS — Z862 Personal history of diseases of the blood and blood-forming organs and certain disorders involving the immune mechanism: Secondary | ICD-10-CM | POA: Insufficient documentation

## 2013-11-15 HISTORY — DX: Fibromyalgia: M79.7

## 2013-11-15 LAB — CBC WITH DIFFERENTIAL/PLATELET
BASOS PCT: 0 % (ref 0–1)
Basophils Absolute: 0 10*3/uL (ref 0.0–0.1)
Eosinophils Absolute: 0.4 10*3/uL (ref 0.0–0.7)
Eosinophils Relative: 3 % (ref 0–5)
HCT: 44.9 % (ref 36.0–46.0)
Hemoglobin: 15.6 g/dL — ABNORMAL HIGH (ref 12.0–15.0)
LYMPHS PCT: 30 % (ref 12–46)
Lymphs Abs: 3.3 10*3/uL (ref 0.7–4.0)
MCH: 31.6 pg (ref 26.0–34.0)
MCHC: 34.7 g/dL (ref 30.0–36.0)
MCV: 91.1 fL (ref 78.0–100.0)
Monocytes Absolute: 0.8 10*3/uL (ref 0.1–1.0)
Monocytes Relative: 7 % (ref 3–12)
NEUTROS ABS: 6.8 10*3/uL (ref 1.7–7.7)
Neutrophils Relative %: 60 % (ref 43–77)
Platelets: 291 10*3/uL (ref 150–400)
RBC: 4.93 MIL/uL (ref 3.87–5.11)
RDW: 12.8 % (ref 11.5–15.5)
WBC: 11.3 10*3/uL — ABNORMAL HIGH (ref 4.0–10.5)

## 2013-11-15 LAB — COMPREHENSIVE METABOLIC PANEL
ALBUMIN: 4.1 g/dL (ref 3.5–5.2)
ALT: 19 U/L (ref 0–35)
AST: 16 U/L (ref 0–37)
Alkaline Phosphatase: 93 U/L (ref 39–117)
BUN: 9 mg/dL (ref 6–23)
CALCIUM: 9.8 mg/dL (ref 8.4–10.5)
CO2: 27 meq/L (ref 19–32)
Chloride: 100 mEq/L (ref 96–112)
Creatinine, Ser: 0.7 mg/dL (ref 0.50–1.10)
GFR calc Af Amer: >90 mL/min (ref >90)
Glucose, Bld: 101 mg/dL — ABNORMAL HIGH (ref 70–99)
Potassium: 3.7 mEq/L (ref 3.7–5.3)
SODIUM: 139 meq/L (ref 137–147)
Total Bilirubin: <0.2 mg/dL — ABNORMAL LOW (ref 0.3–1.2)
Total Protein: 8 g/dL (ref 6.0–8.3)

## 2013-11-15 LAB — D-DIMER, QUANTITATIVE: D-Dimer, Quant: 0.39 ug/mL-FEU (ref 0.00–0.48)

## 2013-11-15 LAB — TROPONIN I: Troponin I: <0.3 ng/mL (ref <0.30)

## 2013-11-15 LAB — LIPASE, BLOOD: LIPASE: 36 U/L (ref 11–59)

## 2013-11-15 MED ORDER — METHYLPREDNISOLONE SODIUM SUCC 125 MG IJ SOLR
125.0000 mg | Freq: Once | INTRAMUSCULAR | Status: DC
  Filled 2013-11-15: qty 2

## 2013-11-15 MED ORDER — METHYLPREDNISOLONE SODIUM SUCC 40 MG IJ SOLR
125.0000 mg | Freq: Once | INTRAMUSCULAR | Status: AC
  Administered 2013-11-15: 125 mg via INTRAVENOUS

## 2013-11-15 MED ORDER — CYCLOBENZAPRINE HCL 5 MG PO TABS: 5.0000 mg | ORAL_TABLET | Freq: Three times a day (TID) | ORAL | Status: DC | PRN

## 2013-11-15 MED ORDER — LORAZEPAM 2 MG/ML IJ SOLN
1.0000 mg | Freq: Once | INTRAMUSCULAR | Status: AC
  Administered 2013-11-15: 1 mg via INTRAVENOUS
  Filled 2013-11-15: qty 1

## 2013-11-15 MED ORDER — CYCLOBENZAPRINE HCL 10 MG PO TABS
10.0000 mg | ORAL_TABLET | Freq: Once | ORAL | Status: AC
  Administered 2013-11-15: 10 mg via ORAL
  Filled 2013-11-15: qty 1

## 2013-11-15 NOTE — Discharge Instructions (Signed)
Stop the Chantix since your symptoms seem to be linked to starting and taking the Chantix. Let Dr Janna Arch know about your ED visit today. He may want to change your smoking cessation program. Take the flexeril for your chest pain. Recheck if you feel worse such as fever, struggling to breathe.
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
## 2013-11-15 NOTE — ED Notes (Signed)
Pt state that she started having chest pain off and on 1 week ago. Stated this started after she started taking Chantix to help her quit smoking. Today she had to increase her dose of Chamtix from 1 pill/day to 2 pills/day. When she increased her dose, the chestpain became constant and she also had dizziness associated with it.

## 2013-11-20 ENCOUNTER — Ambulatory Visit: Payer: Self-pay | Admitting: Adult Health

## 2013-11-21 ENCOUNTER — Ambulatory Visit: Payer: Self-pay | Admitting: Adult Health

## 2013-11-24 ENCOUNTER — Ambulatory Visit: Payer: Self-pay | Admitting: Adult Health

## 2014-07-20 ENCOUNTER — Encounter (HOSPITAL_COMMUNITY): Payer: Self-pay | Admitting: Emergency Medicine

## 2015-02-03 ENCOUNTER — Encounter: Payer: Self-pay | Admitting: Adult Health

## 2015-03-10 ENCOUNTER — Ambulatory Visit: Payer: Self-pay | Admitting: Adult Health

## 2015-03-17 ENCOUNTER — Ambulatory Visit: Payer: Self-pay | Admitting: Adult Health

## 2015-03-26 ENCOUNTER — Ambulatory Visit (INDEPENDENT_AMBULATORY_CARE_PROVIDER_SITE_OTHER): Payer: Medicaid Other | Admitting: Adult Health

## 2015-03-26 ENCOUNTER — Encounter: Payer: Self-pay | Admitting: Adult Health

## 2015-03-26 VITALS — BP 112/76 | HR 108 | Ht 59.0 in | Wt 189.0 lb

## 2015-03-26 DIAGNOSIS — R6882 Decreased libido: Secondary | ICD-10-CM | POA: Diagnosis not present

## 2015-03-26 DIAGNOSIS — R232 Flushing: Secondary | ICD-10-CM

## 2015-03-26 DIAGNOSIS — N951 Menopausal and female climacteric states: Secondary | ICD-10-CM | POA: Diagnosis not present

## 2015-03-26 DIAGNOSIS — N941 Dyspareunia: Secondary | ICD-10-CM

## 2015-03-26 DIAGNOSIS — R252 Cramp and spasm: Secondary | ICD-10-CM | POA: Diagnosis not present

## 2015-03-26 DIAGNOSIS — IMO0002 Reserved for concepts with insufficient information to code with codable children: Secondary | ICD-10-CM

## 2015-03-26 HISTORY — DX: Flushing: R23.2

## 2015-03-26 HISTORY — DX: Decreased libido: R68.82

## 2015-03-26 NOTE — Patient Instructions (Signed)
Dyspareunia Dyspareunia is pain during sexual intercourse. It is most common in women, but it also happens in men.  CAUSES  Female The pain from this condition is usually felt when anything is put into the vagina, but any part of the genitals may cause pain during sex. Even sitting or wearing pants can cause pain. Sometimes, a cause cannot be found. Some causes of pain during intercourse are:  Infections of the skin around the vagina.  Vaginal infections, such as a yeast, bacterial, or viral infection.  Vaginismus. This is the inability to have anything put in the vagina even when the woman wants it to happen. There is an automatic muscle contraction and pain. The pain of the muscle contraction can be so severe that intercourse is impossible.  Allergic reaction from spermicides, semen, condoms, scented tampons, soaps, douches, and vaginal sprays.  A fluid-filled sac (cyst) on the Bartholin or Skene glands, located at the opening of the vagina.  Scar tissue in the vagina from a surgically enlarged opening (episiotomy) or tearing after delivering a baby.  Vaginal dryness. This is more common in menopause. The normal secretions of the vagina are decreased. Changes in estrogen levels and increased difficulty becoming aroused can cause painful sex. Vaginal dryness can also happen when taking birth control pills.  Thinning of the tissue (atrophy) of the vulva and vagina. This makes the area thinner, smaller, unable to stretch to accommodate a penis, and prone to infection and tearing.  Vulvar vestibulitis or vestibulodynia.This is a condition that causes pain involving the area around the entrance to the vagina.The most common cause in young women is birth control pills.Women with low estrogen levels (postmenopausal women) may also experience this.Other causes include allergic reactions, too many nerve endings, skin conditions, and pelvic muscles that cannot relax.  Vulvar dermatoses. This  includes skin conditions such as lichen sclerosus and lichen planus.  Lack of foreplay to lubricate the vagina. This can cause vaginal dryness.  Noncancerous tumors (fibroids) in the uterus.  Uterus lining tissue growing outside the uterus (endometriosis).  Pregnancy that starts in the fallopian tube (tubal pregnancy).  Pregnancy or breastfeeding your baby. This can cause vaginal dryness.  A tilting or prolapse of the uterus. Prolapse is when weak and stretched muscles around the uterus allow it to fall into the vagina.  Problems with the ovaries, cysts, or scar tissue. This may be worse with certain sexual positions.  Previous surgeries causing adhesions or scar tissue in the vagina or pelvis.  Bladder and intestinal problems.  Psychological problems (such as depression or anxiety). This may make pain worse.  Negative attitudes about sex, experiencing rape, sexual assault, and misinformation about sex. These issues are often related to some types of pain.  Previous pelvic infection, causing scar tissue in the pelvis and on the female organs.  Cyst or tumor on the ovary.  Cancer of the female organs.  Certain medicines.  Medical problems such as diabetes, arthritis, or thyroid disease. Female In men, there are many physical causes of sexual discomfort. Some causes of pain during intercourse are:  Infections of the prostate, bladder, or seminal vesicles. This can cause pain after ejaculation.  An inflamed bladder (interstitial cystitis). This may cause pain from ejaculation.  Gonorrheal infections. This may cause pain during ejaculation.  An inflamed urethra (urethritis) or inflamed prostate (prostatitis). This can make genital stimulation painful or uncomfortable.  Deformities of the penis, such as Peyronie's disease.  A tight foreskin.  Cancer of the female organs.    Psychological problems. This may make pain worse. DIAGNOSIS   Your caregiver will take a history and  have you describe where the pain is located (outside the vagina, in the vagina, in the pelvis). You may be asked when you experience pain, such as with penetration or with thrusting.  Following this, your caregiver will do a physical exam. Let your caregiver know if the exam is too painful.  During the final part of the female exam, your caregiver will feel your uterus and ovaries with one hand on the abdomen and one finger in your vagina. This is a pelvic exam.  Blood tests, a Pap test, cultures for infection, an ultrasound test, and X-rays may be done. You may need to see a specialist for female problems (gynecologist).  Your caregiver may do a CT scan, MRI, or laparoscopy. Laparoscopy is a procedure to look into the pelvis with a lighted tube, through a cut (incision) in the abdomen. TREATMENT  Your caregiver can help you determine the best course of treatment. Sometimes, more testing is done. Continue with the suggested testing until your caregiver feels sure about your diagnosis and how to treat it. Sometimes, it is difficult to find the reason for the pain. The search for the cause and treatment can be frustrating. Treatment often takes several weeks to a few months before you notice any improvement. You may also need to avoid sexual activity until symptoms improve.Continuing to have sex when it hurts can delay healing and actually make the problem worse. The treatment depends on the cause of the pain. Treatment may include:  Medicines such as antibiotics, vaginal or skin creams, hormones, or antidepressants.  Minor or major surgery.  Psychological counseling or group therapy.  Kegel exercises and vaginal dilators to help certain cases of vaginismus (spasms). Do this only if recommended by your caregiver.Kegel exercises can make some problems worse.  Applying lubrication as recommended by your caregiver if you have dryness.  Sex therapy for you and your sex partner. It is common for  the pain to continue after the reason for the pain has been treated. Some reasons for this include a conditioned response. This means the person having the pain becomes so familiar with the pain that the pain continues as a response, even though the cause is removed. Sex therapy can help with this problem. HOME CARE INSTRUCTIONS   Follow your caregiver's instructions about taking medicines, tests, counseling, and follow-up treatment.  Do not use scented tampons, douches, vaginal sprays, or soaps.  Use water-based lubricants for dryness. Oil lubricants can cause irritation.  Do not use spermicides or condoms that irritate you.  Openly discuss with your partner your sexual experience, your desires, foreplay, and different sexual positions for a more comfortable and enjoyable sexual relationship.  Join group sessions for therapy, if needed.  Practice safe sex at all times.  Empty your bladder before having intercourse.  Try different positions during sexual intercourse.  Take over-the-counter pain medicine recommended by your caregiver before having sexual intercourse.  Do not wear pantyhose. Knee-high and thigh-high hose are okay.  Avoid scrubbing your vulva with a washcloth. Wash the area gently and pat dry with a towel. SEEK MEDICAL CARE IF:   You develop vaginal bleeding after sexual intercourse.  You develop a lump at the opening of your vagina, even if it is not painful.  You have abnormal vaginal discharge.  You have vaginal dryness.  You have itching or irritation of the vulva or vagina.  You   develop a rash or reaction to your medicine. SEEK IMMEDIATE MEDICAL CARE IF:   You develop severe abdominal pain during or shortly after sexual intercourse. You could have a ruptured ovarian cyst or ruptured tubal pregnancy.  You have a fever.  You have painful or bloody urination.  You have painful sexual intercourse, and you never had it before.  You pass out after having  sexual intercourse. Document Released: 09/24/2007 Document Revised: 11/27/2011 Document Reviewed: 12/05/2010 [facility] Patient Information 2015 [place], [place]. This information is not intended to replace advice given to you by your health care provider. Make sure you discuss any questions you have with your health care provider. Change positions Use astro glide  Discussed increased foreplay, pee before sex, use warm cloth Follow up in 2 weeks

## 2015-03-26 NOTE — Progress Notes (Signed)
Subjective:     Patient ID: Cassandra Lucas, female   DOB: [birth year], 39 y.o.   MRN: [id number]  HPI Cassandra Lucas is a 39 year old white female, sp hysterectomy in 2012, complains of pain with sex, during and after, and hot flashes.She says she has muscle cramps and memory loss and speech blurs, she wants to be checked for MS, as her sister has it, and has talked with PCP, told to talk with him again.She has hard time getting orgasms.Has lost 10 lbs recently and was not trying.  Review of Systems  Patient denies any headaches, hearing loss, fatigue, blurred vision, shortness of breath, chest pain, abdominal pain, problems with bowel movements, urination. No joint pain or mood swings.See HPI for positives. Reviewed past medical,surgical, social and family history. Reviewed medications and allergies.     Objective:   Physical Exam BP 112/76 mmHg  Pulse 108  Ht 4\' 11"  (1.499 m)  Wt 189 lb (85.73 kg)  BMI 38.15 kg/m2  LMP 01/22/2011 Skin warm and dry.Pelvic: external genitalia is normal in appearance no lesions, vagina: white discharge without odor, has some discomfort when pressure to back of vagina,urethra has no lesions or masses noted, cervix and uterus are absent, adnexa: no masses or tenderness noted. Bladder is non tender and no masses felt. Discussed anatomy of vagina area, and also talked about her meds, could contribute to this.    Assessment:     Dyspareunia Decreased libido Hot flashes Muscle cramps    Plan:     Check CBC,CMP,TSH and FSH Trying changing positions with sex and increased foreplay and use lubrication, get him to pull out Follow up in 2 weeks Review handout on dyspareunia Talk with PCP about other symptoms

## 2015-03-27 LAB — COMPREHENSIVE METABOLIC PANEL
A/G RATIO: 2.1 (ref 1.1–2.5)
ALK PHOS: 89 IU/L (ref 39–117)
ALT: 21 IU/L (ref 0–32)
AST: 21 IU/L (ref 0–40)
Albumin: 4.6 g/dL (ref 3.5–5.5)
BILIRUBIN TOTAL: 0.3 mg/dL (ref 0.0–1.2)
BUN/Creatinine Ratio: 13 (ref 8–20)
BUN: 10 mg/dL (ref 6–20)
CO2: 23 mmol/L (ref 18–29)
Calcium: 9.8 mg/dL (ref 8.7–10.2)
Chloride: 101 mmol/L (ref 97–108)
Creatinine, Ser: 0.77 mg/dL (ref 0.57–1.00)
GFR calc Af Amer: 114 mL/min/{1.73_m2} (ref >59)
GFR, EST NON AFRICAN AMERICAN: 99 mL/min/{1.73_m2} (ref >59)
GLOBULIN, TOTAL: 2.2 g/dL (ref 1.5–4.5)
GLUCOSE: 87 mg/dL (ref 65–99)
POTASSIUM: 4.3 mmol/L (ref 3.5–5.2)
Sodium: 140 mmol/L (ref 134–144)
Total Protein: 6.8 g/dL (ref 6.0–8.5)

## 2015-03-27 LAB — CBC
HEMATOCRIT: 44.7 % (ref 34.0–46.6)
Hemoglobin: 15.1 g/dL (ref 11.1–15.9)
MCH: 31.2 pg (ref 26.6–33.0)
MCHC: 33.8 g/dL (ref 31.5–35.7)
MCV: 92 fL (ref 79–97)
Platelets: 284 10*3/uL (ref 150–379)
RBC: 4.84 x10E6/uL (ref 3.77–5.28)
RDW: 13.7 % (ref 12.3–15.4)
WBC: 9.7 10*3/uL (ref 3.4–10.8)

## 2015-03-27 LAB — FOLLICLE STIMULATING HORMONE: FSH: 4.7 m[IU]/mL

## 2015-03-27 LAB — TSH: TSH: 2.21 u[IU]/mL (ref 0.450–4.500)

## 2015-03-29 ENCOUNTER — Telehealth: Payer: Self-pay | Admitting: Adult Health

## 2015-03-29 NOTE — Telephone Encounter (Signed)
Pt aware labs nornal

## 2015-04-12 ENCOUNTER — Ambulatory Visit: Payer: Medicaid Other | Admitting: Adult Health

## 2015-04-20 ENCOUNTER — Ambulatory Visit: Payer: Medicaid Other | Admitting: Adult Health

## 2015-11-22 ENCOUNTER — Encounter: Payer: Self-pay | Admitting: Adult Health

## 2015-11-22 ENCOUNTER — Ambulatory Visit (INDEPENDENT_AMBULATORY_CARE_PROVIDER_SITE_OTHER): Payer: Medicaid Other | Admitting: Adult Health

## 2015-11-22 VITALS — BP 108/80 | HR 78 | Ht 58.3 in | Wt 186.3 lb

## 2015-11-22 DIAGNOSIS — R52 Pain, unspecified: Secondary | ICD-10-CM

## 2015-11-22 DIAGNOSIS — Z01411 Encounter for gynecological examination (general) (routine) with abnormal findings: Secondary | ICD-10-CM

## 2015-11-22 DIAGNOSIS — Z0000 Encounter for general adult medical examination without abnormal findings: Secondary | ICD-10-CM

## 2015-11-22 DIAGNOSIS — Z01419 Encounter for gynecological examination (general) (routine) without abnormal findings: Secondary | ICD-10-CM

## 2015-11-22 DIAGNOSIS — G5603 Carpal tunnel syndrome, bilateral upper limbs: Secondary | ICD-10-CM

## 2015-11-22 HISTORY — DX: Carpal tunnel syndrome, bilateral upper limbs: G56.03

## 2015-11-22 NOTE — Progress Notes (Signed)
Patient ID: Cassandra Lucas, female   DOB: Oct 07, 1975, 40 y.o.   MRN: [id number] History of Present Illness:  Cassandra Lucas is a 40 year old white female in for a well woman gyn exam, she is sp hysterectomy.She is complaining of body aches, has fibromyalgia and hands and fingers hurt and tingle and feel numb.She has some headaches with itchy eyes.Has pain in breast esp right, and it is bigger.Has chest pain at times but feels like it is stress related. Has told Dr Janna Arch and she says she needs to exercise more,she works in housekeeping. PCP is Dr Janna Arch.  Current Medications, Allergies, Past Medical History, Past Surgical History, Family History and Social History were reviewed in [employer] record.     Review of Systems: Patient denies any daily headaches, hearing loss, fatigue, blurred vision, shortness of breath, abdominal pain, problems with bowel movements, urination, or intercourse(not having sex). No j mood swings. See HPI for positives.   Physical Exam:BP 108/80 mmHg  Pulse 78  Ht 4' 10.3" (1.481 m)  Wt 186 lb 4.8 oz (84.505 kg)  BMI 38.53 kg/m2  LMP 01/22/2011  General:  Well developed, well nourished, no acute distress Skin:  Warm and dry Neck:  Midline trachea, normal thyroid, good ROM, no lymphadenopathy Lungs; Clear to auscultation bilaterally Breast:  No dominant palpable mass, retraction, or nipple discharge, R>L and both tender, with more on right, has scar right breast Cardiovascular: Regular rate and rhythm Abdomen:  Soft, non tender, no hepatosplenomegaly Pelvic:  External genitalia is normal in appearance, has small sebaceous cyst in right labia.  The vagina is normal in appearance. Urethra has no lesions or masses. The cervix and uterus are absent.  No adnexal masses or tenderness noted.Bladder is non tender, no masses felt. Extremities/musculoskeletal:  No swelling or varicosities noted, no clubbing or cyanosis, +Phalen's sign Psych:  No  mood changes, alert and cooperative,seems happy She is allergic to NSAIDS, so try tylenol for pain, has not had labs in awhile.  Impression: Well woman gyn exam no pap CTS  Body aches     Plan: Will try wrist splints, ice and rest, review handout  Check CBC,CMP,TSH and lipids,A1c, vitamin D, ANA,RF and CRP Physical in 1 year Mammogram at 42 or before if needed Follow up with Dr Janna Arch for pains and aches, he may want to refer to orthopedic MD for CTS.

## 2015-11-22 NOTE — Patient Instructions (Signed)
Physical in 1 year Mammogram at 42  Try wrist splints, ice and rest  Carpal Tunnel Syndrome Carpal tunnel syndrome is a condition that causes pain in your hand and arm. The carpal tunnel is a narrow area located on the palm side of your wrist. Repeated wrist motion or certain diseases may cause swelling within the tunnel. This swelling pinches the main nerve in the wrist (median nerve). CAUSES  This condition may be caused by:   Repeated wrist motions.  Wrist injuries.  Arthritis.  A cyst or tumor in the carpal tunnel.  Fluid buildup during pregnancy. Sometimes the cause of this condition is not known.  RISK FACTORS This condition is more likely to develop in:   People who have jobs that cause them to repeatedly move their wrists in the same motion, such as butchers and cashiers.  Women.  People with certain conditions, such as:  Diabetes.  Obesity.  An underactive thyroid (hypothyroidism).  Kidney failure. SYMPTOMS  Symptoms of this condition include:   A tingling feeling in your fingers, especially in your thumb, index, and middle fingers.  Tingling or numbness in your hand.  An aching feeling in your entire arm, especially when your wrist and elbow are bent for long periods of time.  Wrist pain that goes up your arm to your shoulder.  Pain that goes down into your palm or fingers.  A weak feeling in your hands. You may have trouble grabbing and holding items. Your symptoms may feel worse during the night.  DIAGNOSIS  This condition is diagnosed with a medical history and physical exam. You may also have tests, including:   An electromyogram (EMG). This test measures electrical signals sent by your nerves into the muscles.  X-rays. TREATMENT  Treatment for this condition includes:  Lifestyle changes. It is important to stop doing or modify the activity that caused your condition.  Physical or occupational therapy.  Medicines for pain and inflammation.  This may include medicine that is injected into your wrist.  A wrist splint.  Surgery. HOME CARE INSTRUCTIONS  If You Have a Splint:  Wear it as told by your health care provider. Remove it only as told by your health care provider.  Loosen the splint if your fingers become numb and tingle, or if they turn cold and blue.  Keep the splint clean and dry. General Instructions  Take over-the-counter and prescription medicines only as told by your health care provider.  Rest your wrist from any activity that may be causing your pain. If your condition is work related, talk to your employer about changes that can be made, such as getting a wrist pad to use while typing.  If directed, apply ice to the painful area:  Put ice in a plastic bag.  Place a towel between your skin and the bag.  Leave the ice on for 20 minutes, 2-3 times per day.  Keep all follow-up visits as told by your health care provider. This is important.  Do any exercises as told by your health care provider, physical therapist, or occupational therapist. SEEK MEDICAL CARE IF:   You have new symptoms.  Your pain is not controlled with medicines.  Your symptoms get worse.   This information is not intended to replace advice given to you by your health care provider. Make sure you discuss any questions you have with your health care provider.   Document Released: 09/01/2000 Document Revised: 05/26/2015 Document Reviewed: 01/20/2015 Elsevier Interactive Patient Education 2016  [employer].

## 2015-11-23 LAB — COMPREHENSIVE METABOLIC PANEL
ALT: 22 IU/L (ref 0–32)
AST: 15 IU/L (ref 0–40)
Albumin/Globulin Ratio: 2.1 (ref 1.1–2.5)
Albumin: 4.7 g/dL (ref 3.5–5.5)
Alkaline Phosphatase: 79 IU/L (ref 39–117)
BUN/Creatinine Ratio: 23 — ABNORMAL HIGH (ref 8–20)
BUN: 16 mg/dL (ref 6–20)
Bilirubin Total: <0.2 mg/dL (ref 0.0–1.2)
CO2: 24 mmol/L (ref 18–29)
CREATININE: 0.7 mg/dL (ref 0.57–1.00)
Calcium: 9.3 mg/dL (ref 8.7–10.2)
Chloride: 102 mmol/L (ref 96–106)
GFR, EST AFRICAN AMERICAN: 127 mL/min/{1.73_m2} (ref >59)
GFR, EST NON AFRICAN AMERICAN: 110 mL/min/{1.73_m2} (ref >59)
Globulin, Total: 2.2 g/dL (ref 1.5–4.5)
Glucose: 54 mg/dL — ABNORMAL LOW (ref 65–99)
Potassium: 4.5 mmol/L (ref 3.5–5.2)
SODIUM: 143 mmol/L (ref 134–144)
Total Protein: 6.9 g/dL (ref 6.0–8.5)

## 2015-11-23 LAB — LIPID PANEL
Chol/HDL Ratio: 8 ratio units — ABNORMAL HIGH (ref 0.0–4.4)
Cholesterol, Total: 249 mg/dL — ABNORMAL HIGH (ref 100–199)
HDL: 31 mg/dL — ABNORMAL LOW (ref >39)
LDL Calculated: 162 mg/dL — ABNORMAL HIGH (ref 0–99)
Triglycerides: 281 mg/dL — ABNORMAL HIGH (ref 0–149)
VLDL Cholesterol Cal: 56 mg/dL — ABNORMAL HIGH (ref 5–40)

## 2015-11-23 LAB — RHEUMATOID FACTOR: Rhuematoid fact SerPl-aCnc: <10 IU/mL (ref 0.0–13.9)

## 2015-11-23 LAB — CBC
HEMOGLOBIN: 14.5 g/dL (ref 11.1–15.9)
Hematocrit: 43.1 % (ref 34.0–46.6)
MCH: 31 pg (ref 26.6–33.0)
MCHC: 33.6 g/dL (ref 31.5–35.7)
MCV: 92 fL (ref 79–97)
Platelets: 277 10*3/uL (ref 150–379)
RBC: 4.67 x10E6/uL (ref 3.77–5.28)
RDW: 13.6 % (ref 12.3–15.4)
WBC: 11.3 10*3/uL — ABNORMAL HIGH (ref 3.4–10.8)

## 2015-11-23 LAB — C-REACTIVE PROTEIN: CRP: 2.9 mg/L (ref 0.0–4.9)

## 2015-11-23 LAB — TSH: TSH: 3.8 u[IU]/mL (ref 0.450–4.500)

## 2015-11-23 LAB — HEMOGLOBIN A1C
ESTIMATED AVERAGE GLUCOSE: 120 mg/dL
Hgb A1c MFr Bld: 5.8 % — ABNORMAL HIGH (ref 4.8–5.6)

## 2015-11-23 LAB — ANA: Anti Nuclear Antibody(ANA): NEGATIVE

## 2015-11-23 LAB — VITAMIN D 25 HYDROXY (VIT D DEFICIENCY, FRACTURES): Vit D, 25-Hydroxy: 19.8 ng/mL — ABNORMAL LOW (ref 30.0–100.0)

## 2015-11-24 ENCOUNTER — Telehealth: Payer: Self-pay | Admitting: Adult Health

## 2015-11-24 ENCOUNTER — Encounter: Payer: Self-pay | Admitting: Adult Health

## 2015-11-24 DIAGNOSIS — E559 Vitamin D deficiency, unspecified: Secondary | ICD-10-CM

## 2015-11-24 DIAGNOSIS — E785 Hyperlipidemia, unspecified: Secondary | ICD-10-CM

## 2015-11-24 HISTORY — DX: Vitamin D deficiency, unspecified: E55.9

## 2015-11-24 HISTORY — DX: Hyperlipidemia, unspecified: E78.5

## 2015-11-24 MED ORDER — CHOLECALCIFEROL 125 MCG (5000 UT) PO CAPS: 5000.0000 [IU] | ORAL_CAPSULE | Freq: Every day | ORAL | Status: DC

## 2015-11-24 MED ORDER — PRAVASTATIN SODIUM 20 MG PO TABS: 20.0000 mg | ORAL_TABLET | Freq: Every day | ORAL | Status: DC

## 2015-11-24 NOTE — Telephone Encounter (Signed)
Pt aware of labs and that Vitamin D 19.8, so take vitamin D3 5000 IU daily and A1c 5.8 decrease carbs and exercise, and TC249, trig 281 with HDL 31 andn LDL 162 will rx pravachol 20 mg and recheck labs in 3 months

## 2016-01-19 ENCOUNTER — Other Ambulatory Visit (HOSPITAL_COMMUNITY): Payer: Self-pay | Admitting: Family Medicine

## 2016-01-19 DIAGNOSIS — M542 Cervicalgia: Secondary | ICD-10-CM

## 2016-01-27 ENCOUNTER — Ambulatory Visit (HOSPITAL_COMMUNITY)
Admission: RE | Admit: 2016-01-27 | Discharge: 2016-01-27 | Disposition: A | Discharge status: Home / Self Care | Payer: Medicaid Other | Source: Ambulatory Visit | Attending: Family Medicine | Admitting: Family Medicine

## 2016-01-27 DIAGNOSIS — M542 Cervicalgia: Secondary | ICD-10-CM | POA: Diagnosis present

## 2016-01-27 DIAGNOSIS — M50222 Other cervical disc displacement at C5-C6 level: Secondary | ICD-10-CM | POA: Insufficient documentation

## 2016-03-22 ENCOUNTER — Telehealth: Payer: Self-pay | Admitting: *Deleted

## 2016-03-22 NOTE — Telephone Encounter (Signed)
Pt informed will place lab order on 04/03/2016, she is scheduled for an appt for lab on 04/03/2016. Pt verbalized understanding.

## 2016-04-03 ENCOUNTER — Other Ambulatory Visit: Payer: Medicaid Other

## 2016-05-06 LAB — BASIC METABOLIC PANEL: Glucose: 108 mg/dL

## 2016-07-23 ENCOUNTER — Emergency Department (HOSPITAL_COMMUNITY)
Admission: EM | Admit: 2016-07-23 | Discharge: 2016-07-23 | Disposition: A | Discharge status: Home / Self Care | Payer: Medicaid Other | Attending: Emergency Medicine | Admitting: Emergency Medicine

## 2016-07-23 ENCOUNTER — Encounter (HOSPITAL_COMMUNITY): Payer: Self-pay | Admitting: Emergency Medicine

## 2016-07-23 ENCOUNTER — Emergency Department (HOSPITAL_COMMUNITY): Payer: Medicaid Other

## 2016-07-23 DIAGNOSIS — Y9389 Activity, other specified: Secondary | ICD-10-CM | POA: Diagnosis not present

## 2016-07-23 DIAGNOSIS — Z79899 Other long term (current) drug therapy: Secondary | ICD-10-CM | POA: Insufficient documentation

## 2016-07-23 DIAGNOSIS — J449 Chronic obstructive pulmonary disease, unspecified: Secondary | ICD-10-CM | POA: Insufficient documentation

## 2016-07-23 DIAGNOSIS — F17210 Nicotine dependence, cigarettes, uncomplicated: Secondary | ICD-10-CM | POA: Insufficient documentation

## 2016-07-23 DIAGNOSIS — S0990XA Unspecified injury of head, initial encounter: Secondary | ICD-10-CM | POA: Diagnosis present

## 2016-07-23 DIAGNOSIS — W1800XA Striking against unspecified object with subsequent fall, initial encounter: Secondary | ICD-10-CM | POA: Insufficient documentation

## 2016-07-23 DIAGNOSIS — Y92009 Unspecified place in unspecified non-institutional (private) residence as the place of occurrence of the external cause: Secondary | ICD-10-CM | POA: Diagnosis not present

## 2016-07-23 DIAGNOSIS — Y999 Unspecified external cause status: Secondary | ICD-10-CM | POA: Insufficient documentation

## 2016-07-23 DIAGNOSIS — E119 Type 2 diabetes mellitus without complications: Secondary | ICD-10-CM | POA: Insufficient documentation

## 2016-07-23 HISTORY — DX: Chronic obstructive pulmonary disease, unspecified: J44.9

## 2016-07-23 LAB — BASIC METABOLIC PANEL
Anion gap: 7 (ref 5–15)
BUN: 16 mg/dL (ref 6–20)
CHLORIDE: 101 mmol/L (ref 101–111)
CO2: 28 mmol/L (ref 22–32)
Calcium: 8.6 mg/dL — ABNORMAL LOW (ref 8.9–10.3)
Creatinine, Ser: 0.84 mg/dL (ref 0.44–1.00)
GFR calc Af Amer: >60 mL/min (ref >60)
GFR calc non Af Amer: >60 mL/min (ref >60)
Glucose, Bld: 146 mg/dL — ABNORMAL HIGH (ref 65–99)
Potassium: 4.1 mmol/L (ref 3.5–5.1)
SODIUM: 136 mmol/L (ref 135–145)

## 2016-07-23 LAB — CBC WITH DIFFERENTIAL/PLATELET
Basophils Absolute: 0 10*3/uL (ref 0.0–0.1)
Basophils Relative: 0 %
EOS ABS: 0 10*3/uL (ref 0.0–0.7)
Eosinophils Relative: 0 %
HCT: 43.2 % (ref 36.0–46.0)
HEMOGLOBIN: 14.7 g/dL (ref 12.0–15.0)
Lymphocytes Relative: 16 %
Lymphs Abs: 2.9 10*3/uL (ref 0.7–4.0)
MCH: 32 pg (ref 26.0–34.0)
MCHC: 34 g/dL (ref 30.0–36.0)
MCV: 94.1 fL (ref 78.0–100.0)
MONO ABS: 0.9 10*3/uL (ref 0.1–1.0)
MONOS PCT: 5 %
Neutro Abs: 13.9 10*3/uL — ABNORMAL HIGH (ref 1.7–7.7)
Neutrophils Relative %: 79 %
Platelets: 303 10*3/uL (ref 150–400)
RBC: 4.59 MIL/uL (ref 3.87–5.11)
RDW: 13 % (ref 11.5–15.5)
WBC: 17.7 10*3/uL — ABNORMAL HIGH (ref 4.0–10.5)

## 2016-07-23 MED ORDER — ALBUTEROL SULFATE (2.5 MG/3ML) 0.083% IN NEBU
2.5000 mg | INHALATION_SOLUTION | Freq: Once | RESPIRATORY_TRACT | Status: AC
  Administered 2016-07-23: 2.5 mg via RESPIRATORY_TRACT
  Filled 2016-07-23: qty 3

## 2016-07-23 NOTE — ED Provider Notes (Signed)
[facility] Provider Note   CSN: [id number] Arrival date & time: 07/23/16  1330     History   Chief Complaint Chief Complaint  Patient presents with  . Head Injury    HPI Cassandra Lucas is a 40 y.o. female.  Patient is a 40 year old female with past medical history of anemia, anxiety, fibromyalgia. She presents for evaluation of a head injury. She reports that she has had dizziness, headaches, blurry vision since she fell and hit her head at home several days ago. She denies any loss of consciousness at the time. She denies any neck pain.   The history is provided by the patient.  Head Injury   The incident occurred 2 days ago. She came to the ER via walk-in. The injury mechanism was a fall. There was no loss of consciousness. There was no blood loss. The pain is moderate. The pain has been constant since the injury. Associated symptoms include blurred vision and vomiting.    Past Medical History:  Diagnosis Date  . Anemia   . Anxiety   . Arthritis   . Carpal tunnel syndrome, bilateral 11/22/2015   Will try wrist splints  . COPD (chronic obstructive pulmonary disease) (HCC)   . Decreased libido 03/26/2015  . Diabetes mellitus without complication (HCC)    prediabetes  . Dyslipidemia 11/24/2015  . Fibromyalgia   . Fibromyalgia   . Hot flashes 03/26/2015  . Hyperlipidemia   . Kidney stones   . Productive cough    smoker  . Renal disorder   . Vitamin D deficiency 11/24/2015    Patient Active Problem List   Diagnosis Date Noted  . Vitamin D deficiency 11/24/2015  . Dyslipidemia 11/24/2015  . Carpal tunnel syndrome, bilateral 11/22/2015  . Decreased libido 03/26/2015  . Hot flashes 03/26/2015  . Kidney stones 04/11/2011    Class: Chronic  . Menometrorrhagia 04/11/2011    Class: Chronic  . Dyspareunia (not due to a general medical condition) 04/11/2011    Class: Chronic    Past Surgical History:  Procedure Laterality Date  . ABDOMINAL HYSTERECTOMY    .  BREAST CYST EXCISION    . CESAREAN SECTION  2006   [facility]  . LAPAROSCOPIC CHOLECYSTECTOMY  2002   [facility]  . LAPAROSCOPIC HYSTERECTOMY  04/12/2011   Procedure: HYSTERECTOMY TOTAL LAPAROSCOPIC;  Surgeon: Lazaro Arms, MD;  Location: [facility];  Service: Gynecology;  Laterality: N/A;  . THYROIDECTOMY, PARTIAL  2012   [facility], [facility]    OB History    Gravida Para Term Preterm AB Living   4 4 0 0 0 4   SAB TAB Ectopic Multiple Live Births   0 0 0 0         Home Medications    Prior to Admission medications   Medication Sig Start Date End Date Taking? Authorizing Provider  albuterol (PROVENTIL HFA;VENTOLIN HFA) 108 (90 BASE) MCG/ACT inhaler Inhale 2 puffs into the lungs every 6 (six) hours as needed for wheezing or shortness of breath.    Historical Provider, MD  Cholecalciferol 5000 units capsule Take 1 capsule (5,000 Units total) by mouth daily. 11/24/15   Adline Potter, NP  gabapentin (NEURONTIN) 300 MG capsule Take 300 mg by mouth 2 (two) times daily.    Historical Provider, MD  LORazepam (ATIVAN) 1 MG tablet Take 1 mg by mouth at bedtime.    Historical Provider, MD  oxyCODONE (OXY IR/ROXICODONE) 5 MG immediate release tablet Take 5 mg by mouth every 6 (  six) hours as needed. pain    Historical Provider, MD  pravastatin (PRAVACHOL) 20 MG tablet Take 1 tablet (20 mg total) by mouth daily. 11/24/15   Adline Potter, NP    Family History Family History  Problem Relation Age of Onset  . Cancer Mother   . Diabetes Father   . Heart failure Father   . Multiple sclerosis Sister   . Migraines Sister   . Scoliosis Daughter   . ADD / ADHD Son   . Cancer Maternal Grandfather     pancreatic  . Diabetes Paternal Grandmother   . Congestive Heart Failure Paternal Grandmother   . ADD / ADHD Daughter   . Scoliosis Daughter   . Hyperlipidemia Daughter   . Diabetes Daughter     prediabetes  . Bipolar disorder Son   . ADD / ADHD Son     Social History Social History  Substance Use Topics    . Smoking status: Current Every Day Smoker    Packs/day: 0.50    Years: 17.00    Types: Cigarettes  . Smokeless tobacco: Never Used  . Alcohol use No     Allergies   Nsaids and Aspirin   Review of Systems Review of Systems  Eyes: Positive for blurred vision.  Gastrointestinal: Positive for vomiting.  All other systems reviewed and are negative.    Physical Exam Updated Vital Signs BP 149/97 (BP Location: Left Arm)   Pulse 112   Temp 98.3 F (36.8 C) (Oral)   Resp 20   Ht 4\' 10"  (1.473 m)   Wt 185 lb (83.9 kg)   LMP 01/22/2011   SpO2 100%   BMI 38.67 kg/m   Physical Exam  Constitutional: She is oriented to person, place, and time. She appears well-developed and well-nourished. No distress.  HENT:  Head: Normocephalic and atraumatic.  Eyes: EOM are normal. Pupils are equal, round, and reactive to light.  Neck: Normal range of motion. Neck supple.  Cardiovascular: Normal rate and regular rhythm.  Exam reveals no gallop and no friction rub.   No murmur heard. Pulmonary/Chest: Effort normal and breath sounds normal. No respiratory distress. She has no wheezes.  Abdominal: Soft. Bowel sounds are normal. She exhibits no distension. There is no tenderness.  Musculoskeletal: Normal range of motion.  Neurological: She is alert and oriented to person, place, and time. No cranial nerve deficit. She exhibits normal muscle tone. Coordination normal.  Skin: Skin is warm and dry. She is not diaphoretic.  Nursing note and vitals reviewed.    ED Treatments / Results  Labs (all labs ordered are listed, but only abnormal results are displayed) Labs Reviewed  CBC WITH DIFFERENTIAL/PLATELET - Abnormal; Notable for the following:       Result Value   WBC 17.7 (*)    Neutro Abs 13.9 (*)    All other components within normal limits  BASIC METABOLIC PANEL - Abnormal; Notable for the following:    Glucose, Bld 146 (*)    Calcium 8.6 (*)    All other components within normal  limits    EKG  EKG Interpretation None       Radiology No results found.  Procedures Procedures (including critical care time)  Medications Ordered in ED Medications - No data to display   Initial Impression / Assessment and Plan / ED Course  I have reviewed the triage vital signs and the nursing notes.  Pertinent labs & imaging results that were available during my care of  the patient were reviewed by me and considered in my medical decision making (see chart for details).  Clinical Course     Patient presents complaining of dizzy spells, headache, since falling and striking her head several days ago. Her neurologic exam is nonfocal and CT scan of the head is unremarkable. Laboratory studies reveal no other explanation for her dizziness. I feel as though she is appropriate for discharge. She is to follow-up with her primary Dr. if not improving.  Final Clinical Impressions(s) / ED Diagnoses   Final diagnoses:  None    New Prescriptions New Prescriptions   No medications on file     Geoffery Lyons, MD 07/23/16 1857

## 2016-07-23 NOTE — ED Notes (Signed)
Pt called to triage for VS re-check. Per registration pt stated she was going to take her son to work and come back.

## 2016-07-23 NOTE — Discharge Instructions (Signed)
Follow-up with your primary Dr. if not improving in the next few days, and return to the ER if your symptoms significantly worsen or change.

## 2016-07-23 NOTE — ED Triage Notes (Signed)
Pt states she has been sick for a couple of weeks, has been taking breathing treatments at home which sometimes make her dizzy. Pt reports she was picking up things on Friday and bent over and lost her balance. Pt struck the L side of her head on the edge of a fireplace. Pt states she "hasn't felt right since." Pt c/o dizziness and headache, reports pain in the L side of her face around the orbit and down into her jaw.

## 2016-11-02 ENCOUNTER — Emergency Department (HOSPITAL_COMMUNITY): Payer: Medicaid Other

## 2016-11-02 ENCOUNTER — Encounter (HOSPITAL_COMMUNITY): Payer: Self-pay | Admitting: Emergency Medicine

## 2016-11-02 ENCOUNTER — Emergency Department (HOSPITAL_COMMUNITY)
Admission: EM | Admit: 2016-11-02 | Discharge: 2016-11-02 | Disposition: A | Discharge status: Home / Self Care | Payer: Medicaid Other | Attending: Emergency Medicine | Admitting: Emergency Medicine

## 2016-11-02 DIAGNOSIS — S53401A Unspecified sprain of right elbow, initial encounter: Secondary | ICD-10-CM

## 2016-11-02 DIAGNOSIS — S43401A Unspecified sprain of right shoulder joint, initial encounter: Secondary | ICD-10-CM

## 2016-11-02 DIAGNOSIS — Y939 Activity, unspecified: Secondary | ICD-10-CM | POA: Diagnosis not present

## 2016-11-02 DIAGNOSIS — Y929 Unspecified place or not applicable: Secondary | ICD-10-CM | POA: Diagnosis not present

## 2016-11-02 DIAGNOSIS — W1839XA Other fall on same level, initial encounter: Secondary | ICD-10-CM | POA: Insufficient documentation

## 2016-11-02 DIAGNOSIS — J449 Chronic obstructive pulmonary disease, unspecified: Secondary | ICD-10-CM | POA: Diagnosis not present

## 2016-11-02 DIAGNOSIS — Z79899 Other long term (current) drug therapy: Secondary | ICD-10-CM | POA: Diagnosis not present

## 2016-11-02 DIAGNOSIS — S63502A Unspecified sprain of left wrist, initial encounter: Secondary | ICD-10-CM | POA: Diagnosis not present

## 2016-11-02 DIAGNOSIS — E119 Type 2 diabetes mellitus without complications: Secondary | ICD-10-CM | POA: Diagnosis not present

## 2016-11-02 DIAGNOSIS — S4991XA Unspecified injury of right shoulder and upper arm, initial encounter: Secondary | ICD-10-CM | POA: Diagnosis present

## 2016-11-02 DIAGNOSIS — F17210 Nicotine dependence, cigarettes, uncomplicated: Secondary | ICD-10-CM | POA: Insufficient documentation

## 2016-11-02 DIAGNOSIS — Y999 Unspecified external cause status: Secondary | ICD-10-CM | POA: Diagnosis not present

## 2016-11-02 MED ORDER — OXYCODONE-ACETAMINOPHEN 5-325 MG PO TABS
1.0000 | ORAL_TABLET | Freq: Once | ORAL | Status: AC
  Administered 2016-11-02: 1 via ORAL
  Filled 2016-11-02: qty 1

## 2016-11-02 NOTE — ED Notes (Signed)
Pt ambulatory to the bathroom, tearful pt in pain, meds given

## 2016-11-02 NOTE — Discharge Instructions (Signed)
Apply ice packs on/off to your elbow and shoulder.  Call the orthopedic doctor listed in a few days if the pain is not improving

## 2016-11-02 NOTE — ED Triage Notes (Signed)
Fell 2 days ago, fell on concrete in mud.  Denies any dizziness or LOC.  C/o pain to right arm and right rib (8/10) and left wrist (3/10).  Pt actually fell 2nd, slightly injuring the left wrist.

## 2016-11-02 NOTE — ED Notes (Signed)
Gave pt ice pack.

## 2016-11-02 NOTE — ED Notes (Signed)
Patient given discharge instruction, verbalized understand. Patient ambulatory out of the department.  

## 2016-11-05 NOTE — ED Provider Notes (Signed)
[facility] Provider Note   CSN: [id number] Arrival date & time: 11/02/16  7353     History   Chief Complaint Chief Complaint  Patient presents with  . Fall    HPI Cassandra Lucas is a 41 y.o. female.  HPI  Cassandra Lucas is a 41 y.o. female who presents to the Emergency Department complaining of right arm pain and rib pain for 2 days.  She reports a fall on concrete landing on right side and then a second fall while trying to get up.  Pain worse with arm movement.  She also complains of left wrist pain.  She denies head injury, shortness of breath, LOC, neck or back pain.    Past Medical History:  Diagnosis Date  . Anemia   . Anxiety   . Arthritis   . Carpal tunnel syndrome, bilateral 11/22/2015   Will try wrist splints  . COPD (chronic obstructive pulmonary disease) (HCC)   . Decreased libido 03/26/2015  . Diabetes mellitus without complication (HCC)    prediabetes  . Dyslipidemia 11/24/2015  . Fibromyalgia   . Fibromyalgia   . Hot flashes 03/26/2015  . Hyperlipidemia   . Kidney stones   . Productive cough    smoker  . Renal disorder   . Vitamin D deficiency 11/24/2015    Patient Active Problem List   Diagnosis Date Noted  . Vitamin D deficiency 11/24/2015  . Dyslipidemia 11/24/2015  . Carpal tunnel syndrome, bilateral 11/22/2015  . Decreased libido 03/26/2015  . Hot flashes 03/26/2015  . Kidney stones 04/11/2011    Class: Chronic  . Menometrorrhagia 04/11/2011    Class: Chronic  . Dyspareunia (not due to a general medical condition) 04/11/2011    Class: Chronic    Past Surgical History:  Procedure Laterality Date  . ABDOMINAL HYSTERECTOMY    . BREAST CYST EXCISION    . CESAREAN SECTION  2006   [facility]  . LAPAROSCOPIC CHOLECYSTECTOMY  2002   [facility]  . LAPAROSCOPIC HYSTERECTOMY  04/12/2011   Procedure: HYSTERECTOMY TOTAL LAPAROSCOPIC;  Surgeon: Lazaro Arms, MD;  Location: [facility];  Service: Gynecology;  Laterality: N/A;  . THYROIDECTOMY, PARTIAL   2012   [facility], [facility]    OB History    Gravida Para Term Preterm AB Living   4 4 0 0 0 4   SAB TAB Ectopic Multiple Live Births   0 0 0 0         Home Medications    Prior to Admission medications   Medication Sig Start Date End Date Taking? Authorizing Provider  acetaminophen (TYLENOL) 500 MG tablet Take 1,000 mg by mouth every 6 (six) hours as needed.   Yes Historical Provider, MD  albuterol (PROVENTIL HFA;VENTOLIN HFA) 108 (90 BASE) MCG/ACT inhaler Inhale 2 puffs into the lungs every 6 (six) hours as needed for wheezing or shortness of breath.   Yes Historical Provider, MD  albuterol (PROVENTIL) (2.5 MG/3ML) 0.083% nebulizer solution Take 2.5 mg by nebulization every 6 (six) hours as needed for wheezing or shortness of breath.   Yes Historical Provider, MD  albuterol-ipratropium (COMBIVENT) 18-103 MCG/ACT inhaler Inhale 1 puff into the lungs every 6 (six) hours as needed for wheezing or shortness of breath.   Yes Historical Provider, MD  Cholecalciferol 5000 units capsule Take 1 capsule (5,000 Units total) by mouth daily. 11/24/15  Yes Adline Potter, NP  gabapentin (NEURONTIN) 300 MG capsule Take 300 mg by mouth 2 (two) times daily.   Yes  Historical Provider, MD  LORazepam (ATIVAN) 1 MG tablet Take 1 mg by mouth at bedtime.   Yes Historical Provider, MD  methocarbamol (ROBAXIN) 750 MG tablet Take 750 mg by mouth daily as needed for muscle spasms.   Yes Historical Provider, MD  Multiple Vitamin (MULTI-DAY VITAMINS PO) Take 1 tablet by mouth daily.   Yes Historical Provider, MD  oxyCODONE (OXY IR/ROXICODONE) 5 MG immediate release tablet Take 5 mg by mouth every 6 (six) hours as needed. pain   Yes Historical Provider, MD  pravastatin (PRAVACHOL) 20 MG tablet Take 1 tablet (20 mg total) by mouth daily. Patient taking differently: Take 40 mg by mouth daily.  11/24/15  Yes Adline Potter, NP  predniSONE (DELTASONE) 10 MG tablet Take 10 mg by mouth See admin instructions. Dose pak for  12 days    Historical Provider, MD    Family History Family History  Problem Relation Age of Onset  . Cancer Mother   . Diabetes Father   . Heart failure Father   . Multiple sclerosis Sister   . Migraines Sister   . Scoliosis Daughter   . ADD / ADHD Son   . Cancer Maternal Grandfather     pancreatic  . Diabetes Paternal Grandmother   . Congestive Heart Failure Paternal Grandmother   . ADD / ADHD Daughter   . Scoliosis Daughter   . Hyperlipidemia Daughter   . Diabetes Daughter     prediabetes  . Bipolar disorder Son   . ADD / ADHD Son     Social History Social History  Substance Use Topics  . Smoking status: Current Every Day Smoker    Packs/day: 0.50    Years: 17.00    Types: Cigarettes  . Smokeless tobacco: Never Used  . Alcohol use No     Allergies   Nsaids and Aspirin   Review of Systems Review of Systems  Constitutional: Negative for chills and fever.  Respiratory: Negative for cough and shortness of breath.   Cardiovascular: Negative for chest pain (right rib pain).  Gastrointestinal: Negative for abdominal pain, nausea and vomiting.  Genitourinary: Negative for difficulty urinating, dysuria and flank pain.  Musculoskeletal: Positive for arthralgias (right shoulder and left wrist pain). Negative for joint swelling and neck pain.  Skin: Negative for color change and wound.  Neurological: Negative for dizziness, weakness, numbness and headaches.  All other systems reviewed and are negative.    Physical Exam Updated Vital Signs BP 126/90   Pulse 88   Temp 98.2 F (36.8 C)   Resp 20   Ht 4\' 9"  (1.448 m)   Wt 83.9 kg   LMP 01/31/2011   SpO2 96%   BMI 40.03 kg/m   Physical Exam  Constitutional: She is oriented to person, place, and time. She appears well-developed and well-nourished. No distress.  HENT:  Head: Normocephalic and atraumatic.  Mouth/Throat: Oropharynx is clear and moist.  Eyes: EOM are normal. Pupils are equal, round, and  reactive to light.  Neck: Normal range of motion. Neck supple. No thyromegaly present.  Cardiovascular: Normal rate, regular rhythm and intact distal pulses.   No murmur heard. Pulmonary/Chest: Effort normal and breath sounds normal. No respiratory distress. She exhibits no tenderness (mild ttp of the right lateral ribs, no crepitus , no bony deformity.).  Abdominal: Soft. She exhibits no distension. There is no tenderness. There is no guarding.  Musculoskeletal: She exhibits tenderness. She exhibits no edema.  ttp of the right shoulder.  Pain with abduction  of the right arm and rotation of the shoulder.  Radial pulse is brisk, distal sensation intact, CR< 2 sec. Grip strength is strong and symmetrical.   No abrasions, edema , erythema or step-off deformity of the joint. Pt has full ROM of the left wrist.  No edema  Lymphadenopathy:    She has no cervical adenopathy.  Neurological: She is alert and oriented to person, place, and time. She has normal strength. No sensory deficit. She exhibits normal muscle tone. Coordination normal.  Reflex Scores:      Tricep reflexes are 1+ on the right side and 2+ on the left side.      Bicep reflexes are 1+ on the right side and 2+ on the left side. Skin: Skin is warm and dry.  Nursing note and vitals reviewed.    ED Treatments / Results  Labs (all labs ordered are listed, but only abnormal results are displayed) Labs Reviewed - No data to display  EKG  EKG Interpretation None       Radiology Dg Shoulder Right  Result Date: 11/02/2016 CLINICAL DATA:  Right shoulder pain after fall 3 days ago. EXAM: RIGHT SHOULDER - 2+ VIEW COMPARISON:  None. FINDINGS: There is no evidence of fracture or dislocation. There is no evidence of arthropathy or other focal bone abnormality. Soft tissues are unremarkable. IMPRESSION: Negative. Electronically Signed   By: Charlett Nose M.D.   On: 11/02/2016 12:00   Dg Elbow Complete Right  Result Date:  11/02/2016 CLINICAL DATA:  Fall 3 days ago.  Right elbow pain. EXAM: RIGHT ELBOW - COMPLETE 3+ VIEW COMPARISON:  None. FINDINGS: There is no evidence of fracture, dislocation, or joint effusion. There is no evidence of arthropathy or other focal bone abnormality. Soft tissues are unremarkable. IMPRESSION: Negative. Electronically Signed   By: Charlett Nose M.D.   On: 11/02/2016 12:01   Dg Wrist Complete Left  Result Date: 11/02/2016 CLINICAL DATA:  Fall 3 days ago, left wrist pain near thumb. EXAM: LEFT WRIST - COMPLETE 3+ VIEW COMPARISON:  09/23/2012 FINDINGS: There is no evidence of fracture or dislocation. There is no evidence of arthropathy or other focal bone abnormality. Soft tissues are unremarkable. IMPRESSION: Negative. Electronically Signed   By: Charlett Nose M.D.   On: 11/02/2016 12:01     Procedures Procedures (including critical care time)  Medications Ordered in ED Medications  oxyCODONE-acetaminophen (PERCOCET/ROXICET) 5-325 MG per tablet 1 tablet (1 tablet Oral Given 11/02/16 1150)     Initial Impression / Assessment and Plan / ED Course  I have reviewed the triage vital signs and the nursing notes.  Pertinent labs & imaging results that were available during my care of the patient were reviewed by me and considered in my medical decision making (see chart for details).     XR's reassuring.  NV intact.  Sling applied.  Discussed possible rotator cuff injury and agrees to close orthopedic f/u in one week if not improving.   Final Clinical Impressions(s) / ED Diagnoses   Final diagnoses:  Sprain of right shoulder, unspecified shoulder sprain type, initial encounter  Sprain of right elbow, initial encounter  Sprain of left wrist, initial encounter    New Prescriptions Discharge Medication List as of 11/02/2016 12:50 PM       Meredith Mells Trisha Mangle, PA-C 11/05/16 1627    Bethann Berkshire, MD 11/06/16 1541

## 2016-11-27 ENCOUNTER — Other Ambulatory Visit: Payer: Medicaid Other | Admitting: Adult Health

## 2016-11-30 ENCOUNTER — Encounter: Payer: Self-pay | Admitting: Adult Health

## 2016-11-30 ENCOUNTER — Ambulatory Visit (INDEPENDENT_AMBULATORY_CARE_PROVIDER_SITE_OTHER): Payer: Medicaid Other | Admitting: Adult Health

## 2016-11-30 VITALS — BP 124/74 | HR 96 | Ht 58.25 in | Wt 193.0 lb

## 2016-11-30 DIAGNOSIS — R5383 Other fatigue: Secondary | ICD-10-CM

## 2016-11-30 DIAGNOSIS — Z0000 Encounter for general adult medical examination without abnormal findings: Secondary | ICD-10-CM

## 2016-11-30 DIAGNOSIS — E559 Vitamin D deficiency, unspecified: Secondary | ICD-10-CM

## 2016-11-30 DIAGNOSIS — E785 Hyperlipidemia, unspecified: Secondary | ICD-10-CM

## 2016-11-30 DIAGNOSIS — Z131 Encounter for screening for diabetes mellitus: Secondary | ICD-10-CM

## 2016-11-30 DIAGNOSIS — R52 Pain, unspecified: Secondary | ICD-10-CM

## 2016-11-30 DIAGNOSIS — F32A Depression, unspecified: Secondary | ICD-10-CM

## 2016-11-30 DIAGNOSIS — Z01419 Encounter for gynecological examination (general) (routine) without abnormal findings: Secondary | ICD-10-CM

## 2016-11-30 DIAGNOSIS — F329 Major depressive disorder, single episode, unspecified: Secondary | ICD-10-CM

## 2016-11-30 NOTE — Patient Instructions (Addendum)
Physical in  1 year Mammogram at 42 

## 2016-11-30 NOTE — Progress Notes (Signed)
Patient ID: Cassandra Lucas, female   DOB: June 22, 1976, 41 y.o.   MRN: [id number] History of Present Illness: Katrina is a 41 year old white female, married in for a well woman gyn exa, she is sp hysterectomy. PCP is Dr Janna Arch.   Current Medications, Allergies, Past Medical History, Past Surgical History, Family History and Social History were reviewed in [employer] record.     Review of Systems: Patient denies any headaches, hearing loss, blurred vision, shortness of breath, chest pain, abdominal pain, problems with bowel movements, urination, or intercourse. No joint pain or mood swings.+body aches, and fatigue, +acne, memory not as good    Physical Exam:BP 124/74 (BP Location: Right Arm, Patient Position: Sitting, Cuff Size: Large)   Pulse 96   Ht 4' 10.25" (1.48 m)   Wt 193 lb (87.5 kg)   LMP 01/31/2011   BMI 39.99 kg/m  General:  Well developed, well nourished, no acute distress Skin:  Warm and dry Neck:  Midline trachea, normal thyroid, good ROM, no lymphadenopathy Lungs; Clear to auscultation bilaterally Breast:  No dominant palpable mass, retraction, or nipple discharge Cardiovascular: Regular rate and rhythm Abdomen:  Soft, non tender, no hepatosplenomegaly Pelvic:  External genitalia is normal in appearance, no lesions.  The vagina is normal in appearance. Urethra has no lesions or masses. The cervix and uterus are absent.  No adnexal masses or tenderness noted.Bladder is non tender, no masses felt. Extremities/musculoskeletal:  No swelling or varicosities noted, no clubbing or cyanosis Psych:  No mood changes, alert and cooperative,seems happy PHQ 9 score 15, is depressed, but denies any suicidal ideations, and declines meds for now.She has not had labs since last year.  Impression: 1. Well woman exam with routine gynecological exam   2. Vitamin D deficiency   3. Dyslipidemia   4. Body aches   5. Fatigue, unspecified type   6. Screening for  diabetes mellitus   7. Depression, unspecified depression type      Plan: Check CBC,CMP,TSH and lipids,A1c and vitamin D and FSH Physical in 1 year Mammogram at 41 F/U with Dr Janna Arch

## 2016-12-01 LAB — COMPREHENSIVE METABOLIC PANEL
ALBUMIN: 4.3 g/dL (ref 3.5–5.5)
ALK PHOS: 76 IU/L (ref 39–117)
ALT: 24 IU/L (ref 0–32)
AST: 24 IU/L (ref 0–40)
Albumin/Globulin Ratio: 2 (ref 1.2–2.2)
BUN / CREAT RATIO: 10 (ref 9–23)
BUN: 7 mg/dL (ref 6–20)
CHLORIDE: 103 mmol/L (ref 96–106)
CO2: 24 mmol/L (ref 18–29)
Calcium: 9 mg/dL (ref 8.7–10.2)
Creatinine, Ser: 0.73 mg/dL (ref 0.57–1.00)
GFR calc Af Amer: 120 mL/min/{1.73_m2} (ref >59)
GFR calc non Af Amer: 104 mL/min/{1.73_m2} (ref >59)
Globulin, Total: 2.2 g/dL (ref 1.5–4.5)
Glucose: 118 mg/dL — ABNORMAL HIGH (ref 65–99)
Potassium: 4.5 mmol/L (ref 3.5–5.2)
Sodium: 143 mmol/L (ref 134–144)
Total Protein: 6.5 g/dL (ref 6.0–8.5)

## 2016-12-01 LAB — CBC
HEMATOCRIT: 43.2 % (ref 34.0–46.6)
HEMOGLOBIN: 14.4 g/dL (ref 11.1–15.9)
MCH: 31.2 pg (ref 26.6–33.0)
MCHC: 33.3 g/dL (ref 31.5–35.7)
MCV: 94 fL (ref 79–97)
PLATELETS: 294 10*3/uL (ref 150–379)
RBC: 4.61 x10E6/uL (ref 3.77–5.28)
RDW: 12.9 % (ref 12.3–15.4)
WBC: 9.4 10*3/uL (ref 3.4–10.8)

## 2016-12-01 LAB — LIPID PANEL
Chol/HDL Ratio: 5.1 ratio units — ABNORMAL HIGH (ref 0.0–4.4)
Cholesterol, Total: 175 mg/dL (ref 100–199)
HDL: 34 mg/dL — AB (ref >39)
LDL Calculated: 108 mg/dL — ABNORMAL HIGH (ref 0–99)
Triglycerides: 167 mg/dL — ABNORMAL HIGH (ref 0–149)
VLDL Cholesterol Cal: 33 mg/dL (ref 5–40)

## 2016-12-01 LAB — HEMOGLOBIN A1C
Est. average glucose Bld gHb Est-mCnc: 117 mg/dL
HEMOGLOBIN A1C: 5.7 % — AB (ref 4.8–5.6)

## 2016-12-01 LAB — FOLLICLE STIMULATING HORMONE: FSH: 10.9 m[IU]/mL

## 2016-12-01 LAB — TSH: TSH: 2.05 u[IU]/mL (ref 0.450–4.500)

## 2016-12-01 LAB — VITAMIN D 25 HYDROXY (VIT D DEFICIENCY, FRACTURES): VIT D 25 HYDROXY: 23.4 ng/mL — AB (ref 30.0–100.0)

## 2016-12-04 ENCOUNTER — Telehealth: Payer: Self-pay | Admitting: *Deleted

## 2016-12-05 ENCOUNTER — Telehealth: Payer: Self-pay | Admitting: Adult Health

## 2016-12-05 NOTE — Telephone Encounter (Signed)
No answer, if she calls, I need to talk with her about her labs

## 2016-12-05 NOTE — Telephone Encounter (Signed)
Pt aware of labs, cut carbs and increase vitamin D to 5000 IU daily.Continue cholesterol meds

## 2017-01-15 ENCOUNTER — Emergency Department (HOSPITAL_COMMUNITY)
Admission: EM | Admit: 2017-01-15 | Discharge: 2017-01-15 | Disposition: A | Discharge status: Home / Self Care | Payer: Medicaid Other | Attending: Emergency Medicine | Admitting: Emergency Medicine

## 2017-01-15 ENCOUNTER — Encounter (HOSPITAL_COMMUNITY): Payer: Self-pay

## 2017-01-15 ENCOUNTER — Emergency Department (HOSPITAL_COMMUNITY): Payer: Medicaid Other

## 2017-01-15 ENCOUNTER — Encounter (HOSPITAL_COMMUNITY): Payer: Self-pay | Admitting: Emergency Medicine

## 2017-01-15 ENCOUNTER — Emergency Department (HOSPITAL_COMMUNITY)
Admission: EM | Admit: 2017-01-15 | Discharge: 2017-01-15 | Disposition: A | Discharge status: Home / Self Care | Payer: Medicaid Other | Source: Home / Self Care | Attending: Emergency Medicine | Admitting: Emergency Medicine

## 2017-01-15 DIAGNOSIS — Y939 Activity, unspecified: Secondary | ICD-10-CM | POA: Diagnosis not present

## 2017-01-15 DIAGNOSIS — S80812A Abrasion, left lower leg, initial encounter: Secondary | ICD-10-CM

## 2017-01-15 DIAGNOSIS — F17210 Nicotine dependence, cigarettes, uncomplicated: Secondary | ICD-10-CM | POA: Insufficient documentation

## 2017-01-15 DIAGNOSIS — Z79899 Other long term (current) drug therapy: Secondary | ICD-10-CM | POA: Insufficient documentation

## 2017-01-15 DIAGNOSIS — Y92410 Unspecified street and highway as the place of occurrence of the external cause: Secondary | ICD-10-CM | POA: Insufficient documentation

## 2017-01-15 DIAGNOSIS — S93432A Sprain of tibiofibular ligament of left ankle, initial encounter: Secondary | ICD-10-CM

## 2017-01-15 DIAGNOSIS — Y999 Unspecified external cause status: Secondary | ICD-10-CM | POA: Insufficient documentation

## 2017-01-15 DIAGNOSIS — J189 Pneumonia, unspecified organism: Secondary | ICD-10-CM

## 2017-01-15 DIAGNOSIS — W19XXXA Unspecified fall, initial encounter: Secondary | ICD-10-CM

## 2017-01-15 DIAGNOSIS — E119 Type 2 diabetes mellitus without complications: Secondary | ICD-10-CM | POA: Diagnosis not present

## 2017-01-15 DIAGNOSIS — J449 Chronic obstructive pulmonary disease, unspecified: Secondary | ICD-10-CM | POA: Insufficient documentation

## 2017-01-15 DIAGNOSIS — W101XXA Fall (on)(from) sidewalk curb, initial encounter: Secondary | ICD-10-CM | POA: Insufficient documentation

## 2017-01-15 DIAGNOSIS — Z23 Encounter for immunization: Secondary | ICD-10-CM | POA: Diagnosis not present

## 2017-01-15 DIAGNOSIS — S99912A Unspecified injury of left ankle, initial encounter: Secondary | ICD-10-CM | POA: Diagnosis present

## 2017-01-15 HISTORY — DX: Pneumonia, unspecified organism: J18.9

## 2017-01-15 LAB — CBC WITH DIFFERENTIAL/PLATELET
Basophils Absolute: 0 10*3/uL (ref 0.0–0.1)
Basophils Relative: 0 %
Eosinophils Absolute: 0 10*3/uL (ref 0.0–0.7)
Eosinophils Relative: 0 %
HEMATOCRIT: 42.1 % (ref 36.0–46.0)
HEMOGLOBIN: 14.5 g/dL (ref 12.0–15.0)
LYMPHS ABS: 1.8 10*3/uL (ref 0.7–4.0)
Lymphocytes Relative: 11 %
MCH: 31.6 pg (ref 26.0–34.0)
MCHC: 34.4 g/dL (ref 30.0–36.0)
MCV: 91.7 fL (ref 78.0–100.0)
MONOS PCT: 3 %
Monocytes Absolute: 0.4 10*3/uL (ref 0.1–1.0)
NEUTROS ABS: 13.7 10*3/uL — AB (ref 1.7–7.7)
NEUTROS PCT: 86 %
Platelets: 263 10*3/uL (ref 150–400)
RBC: 4.59 MIL/uL (ref 3.87–5.11)
RDW: 12.6 % (ref 11.5–15.5)
WBC: 16 10*3/uL — ABNORMAL HIGH (ref 4.0–10.5)

## 2017-01-15 LAB — INFLUENZA PANEL BY PCR (TYPE A & B)
Influenza A By PCR: NEGATIVE
Influenza B By PCR: NEGATIVE

## 2017-01-15 LAB — TROPONIN I: Troponin I: <0.03 ng/mL (ref <0.03)

## 2017-01-15 LAB — BASIC METABOLIC PANEL
ANION GAP: 8 (ref 5–15)
BUN: 14 mg/dL (ref 6–20)
CALCIUM: 9.3 mg/dL (ref 8.9–10.3)
CO2: 26 mmol/L (ref 22–32)
Chloride: 104 mmol/L (ref 101–111)
Creatinine, Ser: 0.82 mg/dL (ref 0.44–1.00)
GFR calc Af Amer: >60 mL/min (ref >60)
GFR calc non Af Amer: >60 mL/min (ref >60)
GLUCOSE: 242 mg/dL — AB (ref 65–99)
Potassium: 4 mmol/L (ref 3.5–5.1)
Sodium: 138 mmol/L (ref 135–145)

## 2017-01-15 MED ORDER — AZITHROMYCIN 250 MG PO TABS: 250.0000 mg | ORAL_TABLET | Freq: Every day | ORAL | 0 refills | Status: DC

## 2017-01-15 MED ORDER — CYCLOBENZAPRINE HCL 10 MG PO TABS
10.0000 mg | ORAL_TABLET | Freq: Once | ORAL | Status: AC
  Administered 2017-01-15: 10 mg via ORAL
  Filled 2017-01-15: qty 1

## 2017-01-15 MED ORDER — ALBUTEROL SULFATE (2.5 MG/3ML) 0.083% IN NEBU
5.0000 mg | INHALATION_SOLUTION | Freq: Once | RESPIRATORY_TRACT | Status: AC
  Administered 2017-01-15: 5 mg via RESPIRATORY_TRACT
  Filled 2017-01-15: qty 6

## 2017-01-15 MED ORDER — AZITHROMYCIN 250 MG PO TABS
500.0000 mg | ORAL_TABLET | Freq: Once | ORAL | Status: AC
  Administered 2017-01-15: 500 mg via ORAL
  Filled 2017-01-15: qty 2

## 2017-01-15 MED ORDER — HYDROCODONE-ACETAMINOPHEN 5-325 MG PO TABS
1.0000 | ORAL_TABLET | Freq: Once | ORAL | Status: AC
  Administered 2017-01-15: 1 via ORAL
  Filled 2017-01-15: qty 1

## 2017-01-15 MED ORDER — TETANUS-DIPHTH-ACELL PERTUSSIS 5-2.5-18.5 LF-MCG/0.5 IM SUSP
0.5000 mL | Freq: Once | INTRAMUSCULAR | Status: AC
  Administered 2017-01-15: 0.5 mL via INTRAMUSCULAR
  Filled 2017-01-15: qty 0.5

## 2017-01-15 MED ORDER — ACETAMINOPHEN-CODEINE #3 300-30 MG PO TABS: 1.0000 | ORAL_TABLET | ORAL | 0 refills | Status: DC | PRN

## 2017-01-15 MED ORDER — DEXTROSE 5 % IV SOLN
1.0000 g | Freq: Once | INTRAVENOUS | Status: AC
  Administered 2017-01-15: 1 g via INTRAVENOUS
  Filled 2017-01-15: qty 10

## 2017-01-15 NOTE — Discharge Instructions (Signed)
Please take the antibiotics starting tomorrow. You are already covered for today. Please follow up with your regular doctor within the next few weeks to repeat your chest xray. If your symptoms fail to improve over the next couple of days or worsen, please come back.

## 2017-01-15 NOTE — ED Triage Notes (Signed)
Pt sent by Dr Delbert Harness for evaluation. Pt has been coughing x 10 days. And was given steroids and neb tx. No  Chest xray was done. Pt was rechecked today and advised to come here due to chest hurting. Pt reports chest feels tight and productive cough

## 2017-01-15 NOTE — ED Provider Notes (Signed)
[facility] Provider Note   CSN: [id number] Arrival date & time: 01/15/17  1641  By signing my name below, I, Cynda Acres, attest that this documentation has been prepared under the direction and in the presence of Aemon Koeller PA-C. Electronically Signed: Cynda Acres, Scribe. 01/15/17. 6:46 PM.  History   Chief Complaint Chief Complaint  Patient presents with  . Ankle Pain    HPI Comments: Cassandra Lucas is a 41 y.o. female with no pertinent medical history, who presents to the Emergency Department complaining of sudden-onset, constant left ankle pain that began earlier tonight. Patient states she stepped off a curb and fell into the street, injuring her left ankle. Patient denies any head injury or LOC.  Patient reports associated left ankle swelling, left toe numbness,  Patient reports applying ice with no relief. Patient states her pain is worse with ambulation, nothing improves her pain. Patient denies any weakness, fever, chills, nausea, vomiting, neck pain, or back pain. Patient was seen here several hours ago for an unrelated issue.  The history is provided by the patient. No language interpreter was used.    Past Medical History:  Diagnosis Date  . Anemia   . Anxiety   . Arthritis   . Carpal tunnel syndrome, bilateral 11/22/2015   Will try wrist splints  . Community acquired pneumonia   . COPD (chronic obstructive pulmonary disease) (HCC)   . Decreased libido 03/26/2015  . Diabetes mellitus without complication (HCC)    prediabetes  . Dyslipidemia 11/24/2015  . Fibromyalgia   . Fibromyalgia   . Hot flashes 03/26/2015  . Hyperlipidemia   . Kidney stones   . Productive cough    smoker  . Renal disorder   . Vitamin D deficiency 11/24/2015    Patient Active Problem List   Diagnosis Date Noted  . Fatigue 11/30/2016  . Body aches 11/30/2016  . Vitamin D deficiency 11/24/2015  . Dyslipidemia 11/24/2015  . Carpal tunnel syndrome, bilateral 11/22/2015  .  Decreased libido 03/26/2015  . Hot flashes 03/26/2015  . Kidney stones 04/11/2011    Class: Chronic  . Menometrorrhagia 04/11/2011    Class: Chronic  . Dyspareunia (not due to a general medical condition) 04/11/2011    Class: Chronic    Past Surgical History:  Procedure Laterality Date  . ABDOMINAL HYSTERECTOMY    . BREAST CYST EXCISION    . CESAREAN SECTION  2006   [facility]  . LAPAROSCOPIC CHOLECYSTECTOMY  2002   [facility]  . LAPAROSCOPIC HYSTERECTOMY  04/12/2011   Procedure: HYSTERECTOMY TOTAL LAPAROSCOPIC;  Surgeon: Lazaro Arms, MD;  Location: [facility];  Service: Gynecology;  Laterality: N/A;  . THYROIDECTOMY, PARTIAL  2012   [facility], [facility]    OB History    Gravida Para Term Preterm AB Living   4 4 0 0 0 4   SAB TAB Ectopic Multiple Live Births   0 0 0 0         Home Medications    Prior to Admission medications   Medication Sig Start Date End Date Taking? Authorizing Provider  acetaminophen (TYLENOL) 500 MG tablet Take 1,000 mg by mouth daily as needed for mild pain.     Historical Provider, MD  acetaminophen-codeine (TYLENOL #3) 300-30 MG tablet Take 1-2 tablets by mouth every 4 (four) hours as needed for moderate pain. 01/15/17   Ardith Dark, MD  albuterol (PROVENTIL HFA;VENTOLIN HFA) 108 (90 BASE) MCG/ACT inhaler Inhale 2 puffs into the lungs every 6 (six) hours  as needed for wheezing or shortness of breath.    Historical Provider, MD  albuterol (PROVENTIL) (2.5 MG/3ML) 0.083% nebulizer solution Take 2.5 mg by nebulization every 6 (six) hours as needed for wheezing or shortness of breath.    Historical Provider, MD  azithromycin (ZITHROMAX) 250 MG tablet Take 1 tablet (250 mg total) by mouth daily. Start 01/16/2017 01/15/17   Ardith Dark, MD  cetirizine (ZYRTEC) 10 MG tablet Take 10 mg by mouth at bedtime.    Historical Provider, MD  Cholecalciferol (VITAMIN D) 2000 units CAPS Take 1 capsule by mouth daily.    Historical Provider, MD  Cholecalciferol 5000 units capsule Take 1  capsule (5,000 Units total) by mouth daily. Patient not taking: Reported on 01/15/2017 11/24/15   Adline Potter, NP  Cimetidine (HEARTBURN RELIEF PO) Take 1 tablet by mouth daily as needed (heartburn).    Historical Provider, MD  gabapentin (NEURONTIN) 300 MG capsule Take 300 mg by mouth 2 (two) times daily.    Historical Provider, MD  ipratropium (ATROVENT HFA) 17 MCG/ACT inhaler Inhale 1 puff into the lungs 4 (four) times daily.    Historical Provider, MD  LORazepam (ATIVAN) 1 MG tablet Take 1 mg by mouth at bedtime.    Historical Provider, MD  methocarbamol (ROBAXIN) 750 MG tablet Take 750 mg by mouth daily as needed for muscle spasms.    Historical Provider, MD  Multiple Vitamin (MULTI-DAY VITAMINS PO) Take 1 tablet by mouth daily.    Historical Provider, MD  oxyCODONE (OXY IR/ROXICODONE) 5 MG immediate release tablet Take 5 mg by mouth every 6 (six) hours as needed. pain    Historical Provider, MD  pravastatin (PRAVACHOL) 20 MG tablet Take 1 tablet (20 mg total) by mouth daily. Patient taking differently: Take 40 mg by mouth daily.  11/24/15   Adline Potter, NP  predniSONE (DELTASONE) 10 MG tablet Take 10 mg by mouth See admin instructions. Dose pak for 12 days    Historical Provider, MD    Family History Family History  Problem Relation Age of Onset  . Cancer Mother   . Diabetes Father   . Heart failure Father   . Multiple sclerosis Sister   . Migraines Sister   . Scoliosis Daughter   . ADD / ADHD Son   . Cancer Maternal Grandfather     pancreatic  . Diabetes Paternal Grandmother   . Congestive Heart Failure Paternal Grandmother   . ADD / ADHD Daughter   . Scoliosis Daughter   . Hyperlipidemia Daughter   . Diabetes Daughter     prediabetes  . Bipolar disorder Son   . ADD / ADHD Son     Social History Social History  Substance Use Topics  . Smoking status: Current Every Day Smoker    Packs/day: 0.50    Years: 17.00    Types: Cigarettes  . Smokeless tobacco: Never  Used  . Alcohol use No     Allergies   Nsaids; Aspirin; and Macrobid [nitrofurantoin macrocrystal]   Review of Systems Review of Systems  Constitutional: Negative for chills and fever.  Respiratory: Negative for chest tightness and shortness of breath.   Gastrointestinal: Negative for nausea and vomiting.  Musculoskeletal: Positive for arthralgias (left ankle and left calf) and joint swelling. Negative for back pain and neck pain.  Skin: Positive for wound (abrasion). Negative for color change.  Neurological: Positive for numbness (left toes). Negative for syncope, weakness and headaches.  All other systems reviewed and are  negative.    Physical Exam Updated Vital Signs BP 113/79 (BP Location: Left Arm)   Pulse (!) 107   Temp 98.3 F (36.8 C) (Oral)   Resp (!) 22   Ht 4\' 10"  (1.473 m)   Wt 193 lb (87.5 kg)   LMP 01/31/2011   SpO2 99%   BMI 40.34 kg/m   Physical Exam  Constitutional: She is oriented to person, place, and time. She appears well-developed. No distress.  HENT:  Head: Normocephalic and atraumatic.  Mouth/Throat: Oropharynx is clear and moist.  Eyes: Conjunctivae and EOM are normal. Pupils are equal, round, and reactive to light.  Neck: Normal range of motion. Neck supple.  Cardiovascular: Normal rate, regular rhythm and intact distal pulses.   Pulmonary/Chest: Effort normal. No respiratory distress. She exhibits no tenderness.  Abdominal: Soft. Bowel sounds are normal.  Musculoskeletal: Normal range of motion. She exhibits edema and tenderness. She exhibits no deformity.  Patient has tenderness and moderate edema of the lateral left ankle. No tenderness of the foot. No bony deformity. Compartments soft.   Neurological: She is alert and oriented to person, place, and time.  Skin: Skin is warm and dry. Capillary refill takes less than 2 seconds.  Few superficial abrasions of the left lower leg. No active bleeding.   Psychiatric: She has a normal mood and  affect.  Nursing note and vitals reviewed.    ED Treatments / Results  DIAGNOSTIC STUDIES: Oxygen Saturation is 99% on RA, normal by my interpretation.    COORDINATION OF CARE: 6:34 PM Discussed treatment plan with pt at bedside and pt agreed to plan, which includes an ankle brace.   Labs (all labs ordered are listed, but only abnormal results are displayed) Labs Reviewed - No data to display  EKG  EKG Interpretation None       Radiology Dg Chest 2 View  Result Date: 01/15/2017 CLINICAL DATA:  Ten days of productive cough, chest tightness. History of COPD, current smoker, diabetes. EXAM: CHEST  2 VIEW COMPARISON:  Chest x-ray of July 23, 2016 FINDINGS: The lungs are well-expanded. There is patchy increased density in the left lower lobe with air bronchograms. The right lung is clear. There is no significant pleural effusion. The heart and pulmonary vascularity are normal. The mediastinum is normal in width. The trachea is midline. The bony thorax exhibits no acute abnormality. IMPRESSION: Left lower lobe pneumonia. Followup PA and lateral chest X-ray is recommended in 3-4 weeks following trial of antibiotic therapy to ensure resolution. Electronically Signed   By: David  July 25, 2016 M.D.   On: 01/15/2017 12:28   Dg Ankle Complete Left  Result Date: 01/15/2017 CLINICAL DATA:  Fall.  Lateral left ankle pain and swelling EXAM: LEFT ANKLE COMPLETE - 3+ VIEW COMPARISON:  None. FINDINGS: Mild lateral left ankle soft tissue swelling. No fracture or subluxation. No suspicious focal osseous lesion. No appreciable arthropathy. Small plantar and tiny Achilles left calcaneal spurs. IMPRESSION: Mild lateral left ankle soft tissue swelling, with no fracture or subluxation. Electronically Signed   By: 10-14-1968 M.D.   On: 01/15/2017 17:50   Dg Foot Complete Right  Result Date: 01/15/2017 CLINICAL DATA:  Right foot pain at the second and third toes after falling off a curb. EXAM: RIGHT FOOT  COMPLETE - 3+ VIEW COMPARISON:  None. FINDINGS: There is no evidence of fracture or dislocation. Plantar and dorsal calcaneal enthesophytes are present. There is no evidence of arthropathy or other focal bone abnormality. Mild soft tissue swelling  of the forefoot. IMPRESSION: Soft tissue swelling of the forefoot. No acute displaced fracture is identified. Electronically Signed   By: Tollie Eth M.D.   On: 01/15/2017 17:50    Procedures Procedures (including critical care time)  Medications Ordered in ED Medications  HYDROcodone-acetaminophen (NORCO/VICODIN) 5-325 MG per tablet 1 tablet (1 tablet Oral Given 01/15/17 1852)  Tdap (BOOSTRIX) injection 0.5 mL (0.5 mLs Intramuscular Given 01/15/17 1926)     Initial Impression / Assessment and Plan / ED Course  I have reviewed the triage vital signs and the nursing notes.  Pertinent labs & imaging results that were available during my care of the patient were reviewed by me and considered in my medical decision making (see chart for details).     6:42 PM Patient declines crutches, so cam walker was ordered.   Td updated.  Wounds cleaned and bandaged.  Cam walker boot applied by nursing.  Pain improved.  Remains NV intact. Pt agrees to RICE therapy and orthopedic f/u if needed.   Final Clinical Impressions(s) / ED Diagnoses   Final diagnoses:  Sprain of tibiofibular ligament of left ankle, initial encounter  Abrasion of left lower leg, initial encounter  Fall, initial encounter    New Prescriptions New Prescriptions   No medications on file   I personally performed the services described in this documentation, which was scribed in my presence. The recorded information has been reviewed and is accurate.     Pauline Aus, PA-C 01/17/17 2059    Vanetta Mulders, MD 01/18/17 [phone]

## 2017-01-15 NOTE — Discharge Instructions (Signed)
Elevate and apply ice packs on/off to your ankle.  Call Dr. Mort Sawyers office to arrange a follow-up appt in one week if the pain is not improving

## 2017-01-15 NOTE — ED Notes (Signed)
EKG given to Dr. Yelverton 

## 2017-01-15 NOTE — ED Provider Notes (Signed)
[facility] Provider Note   CSN: [id number] Arrival date & time: 01/15/17  1140     History   Chief Complaint Chief Complaint  Patient presents with  . Cough    HPI Cassandra Lucas is a 41 y.o. female with history of COPD presenting with cough and shortness of breath for 10 days.  Patient's symptoms started about 7-10 days ago. Both of her children were diagnosed with the flu a couple of weeks ago and she thinks that she may have also had the flu. She has had some subjective fevers and chills and a nonproductive cough. Patient reports good compliance with her home COPD medications. States that they give her some relief but it is only short lived. She has some chest tightness in the center of her chest that feels like a "pressure" when she takes a deep breath. She has not noticed anything else that makes the symptoms better or worse. No leg swelling. No history of VTE. No hormone use. No recent immobilization, trauma, or surgery.   HPI  Past Medical History:  Diagnosis Date  . Anemia   . Anxiety   . Arthritis   . Carpal tunnel syndrome, bilateral 11/22/2015   Will try wrist splints  . COPD (chronic obstructive pulmonary disease) (HCC)   . Decreased libido 03/26/2015  . Diabetes mellitus without complication (HCC)    prediabetes  . Dyslipidemia 11/24/2015  . Fibromyalgia   . Fibromyalgia   . Hot flashes 03/26/2015  . Hyperlipidemia   . Kidney stones   . Productive cough    smoker  . Renal disorder   . Vitamin D deficiency 11/24/2015    Patient Active Problem List   Diagnosis Date Noted  . Fatigue 11/30/2016  . Body aches 11/30/2016  . Vitamin D deficiency 11/24/2015  . Dyslipidemia 11/24/2015  . Carpal tunnel syndrome, bilateral 11/22/2015  . Decreased libido 03/26/2015  . Hot flashes 03/26/2015  . Kidney stones 04/11/2011    Class: Chronic  . Menometrorrhagia 04/11/2011    Class: Chronic  . Dyspareunia (not due to a general medical condition) 04/11/2011   Class: Chronic    Past Surgical History:  Procedure Laterality Date  . ABDOMINAL HYSTERECTOMY    . BREAST CYST EXCISION    . CESAREAN SECTION  2006   [facility]  . LAPAROSCOPIC CHOLECYSTECTOMY  2002   [facility]  . LAPAROSCOPIC HYSTERECTOMY  04/12/2011   Procedure: HYSTERECTOMY TOTAL LAPAROSCOPIC;  Surgeon: Lazaro Arms, MD;  Location: [facility];  Service: Gynecology;  Laterality: N/A;  . THYROIDECTOMY, PARTIAL  2012   [facility]    OB History    Gravida Para Term Preterm AB Living   4 4 0 0 0 4   SAB TAB Ectopic Multiple Live Births   0 0 0 0         Home Medications    Prior to Admission medications   Medication Sig Start Date End Date Taking? Authorizing Provider  acetaminophen (TYLENOL) 500 MG tablet Take 1,000 mg by mouth daily as needed for mild pain.    Yes Historical Provider, MD  albuterol (PROVENTIL HFA;VENTOLIN HFA) 108 (90 BASE) MCG/ACT inhaler Inhale 2 puffs into the lungs every 6 (six) hours as needed for wheezing or shortness of breath.   Yes Historical Provider, MD  albuterol (PROVENTIL) (2.5 MG/3ML) 0.083% nebulizer solution Take 2.5 mg by nebulization every 6 (six) hours as needed for wheezing or shortness of breath.   Yes Historical Provider, MD  cetirizine (ZYRTEC) 10  MG tablet Take 10 mg by mouth at bedtime.   Yes Historical Provider, MD  Cholecalciferol (VITAMIN D) 2000 units CAPS Take 1 capsule by mouth daily.   Yes Historical Provider, MD  Cimetidine (HEARTBURN RELIEF PO) Take 1 tablet by mouth daily as needed (heartburn).   Yes Historical Provider, MD  gabapentin (NEURONTIN) 300 MG capsule Take 300 mg by mouth 2 (two) times daily.   Yes Historical Provider, MD  ipratropium (ATROVENT HFA) 17 MCG/ACT inhaler Inhale 1 puff into the lungs 4 (four) times daily.   Yes Historical Provider, MD  LORazepam (ATIVAN) 1 MG tablet Take 1 mg by mouth at bedtime.   Yes Historical Provider, MD  methocarbamol (ROBAXIN) 750 MG tablet Take 750 mg by mouth daily as needed for muscle  spasms.   Yes Historical Provider, MD  Multiple Vitamin (MULTI-DAY VITAMINS PO) Take 1 tablet by mouth daily.   Yes Historical Provider, MD  oxyCODONE (OXY IR/ROXICODONE) 5 MG immediate release tablet Take 5 mg by mouth every 6 (six) hours as needed. pain   Yes Historical Provider, MD  pravastatin (PRAVACHOL) 20 MG tablet Take 1 tablet (20 mg total) by mouth daily. Patient taking differently: Take 40 mg by mouth daily.  11/24/15  Yes Adline Potter, NP  predniSONE (DELTASONE) 10 MG tablet Take 10 mg by mouth See admin instructions. Dose pak for 12 days   Yes Historical Provider, MD  acetaminophen-codeine (TYLENOL #3) 300-30 MG tablet Take 1-2 tablets by mouth every 4 (four) hours as needed for moderate pain. 01/15/17   Ardith Dark, MD  azithromycin (ZITHROMAX) 250 MG tablet Take 1 tablet (250 mg total) by mouth daily. Start 01/16/2017 01/15/17   Ardith Dark, MD  Cholecalciferol 5000 units capsule Take 1 capsule (5,000 Units total) by mouth daily. Patient not taking: Reported on 01/15/2017 11/24/15   Adline Potter, NP    Family History Family History  Problem Relation Age of Onset  . Cancer Mother   . Diabetes Father   . Heart failure Father   . Multiple sclerosis Sister   . Migraines Sister   . Scoliosis Daughter   . ADD / ADHD Son   . Cancer Maternal Grandfather     pancreatic  . Diabetes Paternal Grandmother   . Congestive Heart Failure Paternal Grandmother   . ADD / ADHD Daughter   . Scoliosis Daughter   . Hyperlipidemia Daughter   . Diabetes Daughter     prediabetes  . Bipolar disorder Son   . ADD / ADHD Son     Social History Social History  Substance Use Topics  . Smoking status: Current Every Day Smoker    Packs/day: 0.50    Years: 17.00    Types: Cigarettes  . Smokeless tobacco: Never Used  . Alcohol use No     Allergies   Nsaids; Aspirin; and Macrobid [nitrofurantoin macrocrystal]   Review of Systems Review of Systems  Constitutional: Negative.     HENT: Negative.   Eyes: Negative.   Respiratory: Positive for cough, chest tightness, shortness of breath and wheezing.   Cardiovascular: Negative for leg swelling.  Gastrointestinal: Negative.   Endocrine: Negative.   Genitourinary: Negative.   Musculoskeletal: Negative.   Skin: Negative.   Allergic/Immunologic: Negative.   Neurological: Negative.   Hematological: Negative.   Psychiatric/Behavioral: Negative.      Physical Exam Updated Vital Signs BP 90/61 (BP Location: Left Arm)   Pulse 98   Temp 98.4 F (36.9 C) (Oral)  Resp 20   LMP 01/31/2011   SpO2 95%   Physical Exam  Constitutional: She is oriented to person, place, and time. She appears well-developed and well-nourished. No distress.  HENT:  Head: Normocephalic and atraumatic.  Eyes: EOM are normal. Pupils are equal, round, and reactive to light.  Neck: Normal range of motion.  Cardiovascular: Regular rhythm and normal heart sounds.  Tachycardia present.   Pulmonary/Chest: Effort normal. No respiratory distress. She has wheezes. She has rales.  Abdominal: Soft. Bowel sounds are normal. She exhibits no distension.  Musculoskeletal: Normal range of motion.  Neurological: She is alert and oriented to person, place, and time.  Skin: Skin is warm and dry. Capillary refill takes less than 2 seconds.  Psychiatric: She has a normal mood and affect. Her behavior is normal. Judgment and thought content normal.  Nursing note and vitals reviewed.    ED Treatments / Results  Labs (all labs ordered are listed, but only abnormal results are displayed) Labs Reviewed  BASIC METABOLIC PANEL - Abnormal; Notable for the following:       Result Value   Glucose, Bld 242 (*)    All other components within normal limits  CBC WITH DIFFERENTIAL/PLATELET - Abnormal; Notable for the following:    WBC 16.0 (*)    Neutro Abs 13.7 (*)    All other components within normal limits  TROPONIN I  INFLUENZA PANEL BY PCR (TYPE A & B)     EKG  EKG Interpretation None       Radiology Dg Chest 2 View  Result Date: 01/15/2017 CLINICAL DATA:  Ten days of productive cough, chest tightness. History of COPD, current smoker, diabetes. EXAM: CHEST  2 VIEW COMPARISON:  Chest x-ray of July 23, 2016 FINDINGS: The lungs are well-expanded. There is patchy increased density in the left lower lobe with air bronchograms. The right lung is clear. There is no significant pleural effusion. The heart and pulmonary vascularity are normal. The mediastinum is normal in width. The trachea is midline. The bony thorax exhibits no acute abnormality. IMPRESSION: Left lower lobe pneumonia. Followup PA and lateral chest X-ray is recommended in 3-4 weeks following trial of antibiotic therapy to ensure resolution. Electronically Signed   By: David  [place] M.D.   On: 01/15/2017 12:28    Procedures Procedures (including critical care time)  Medications Ordered in ED Medications  albuterol (PROVENTIL) (2.5 MG/3ML) 0.083% nebulizer solution 5 mg (5 mg Nebulization Given 01/15/17 1236)  cefTRIAXone (ROCEPHIN) 1 g in dextrose 5 % 50 mL IVPB (0 g Intravenous Stopped 01/15/17 1353)  azithromycin (ZITHROMAX) tablet 500 mg (500 mg Oral Given 01/15/17 1323)  cyclobenzaprine (FLEXERIL) tablet 10 mg (10 mg Oral Given 01/15/17 1323)     Initial Impression / Assessment and Plan / ED Course  I have reviewed the triage vital signs and the nursing notes.  Pertinent labs & imaging results that were available during my care of the patient were reviewed by me and considered in my medical decision making (see chart for details).     Patient is a 41 year old presenting with 1 week of cough and shortness of breath. CXR consistent with CAP. Her vitals here are stable and she noticed improvement in her respiratory status after albuterol nebulizers. EKG with sinus tachycardia and no ischemic changes. Troponin negative. Chest wall pain improved with flexeril. Patient stable  for discharge home. Will send home with Rx for azithromycin and tylenol#3 for her chest wall tenderness. Strict return precautions reviewed.  Advised close follow up with PCP and repeat CXR in 3-4 weeks.   Final Clinical Impressions(s) / ED Diagnoses   Final diagnoses:  Community acquired pneumonia, unspecified laterality    New Prescriptions New Prescriptions   ACETAMINOPHEN-CODEINE (TYLENOL #3) 300-30 MG TABLET    Take 1-2 tablets by mouth every 4 (four) hours as needed for moderate pain.   AZITHROMYCIN (ZITHROMAX) 250 MG TABLET    Take 1 tablet (250 mg total) by mouth daily. Start 01/16/2017     Ardith Dark, MD 01/15/17 1424    Loren Racer, MD 01/15/17 [phone]

## 2017-01-15 NOTE — ED Triage Notes (Signed)
Pt reports she stepped off curb and fell into street. Injuring left ankle, right toes and left hip. Swelling noted to left lateral ankle

## 2017-01-19 ENCOUNTER — Telehealth: Payer: Self-pay | Admitting: Orthopedic Surgery

## 2017-01-19 NOTE — Telephone Encounter (Signed)
Patient relays she has been seen by [facility] physician 01/15/17 for a left ankle injury, following a fall from a curb.  Discussed scheduling appointment, and due to insurance requirement, referral from primary care is needed. Appointment pending.  Patient will contact primary care today to request referral.

## 2017-02-07 ENCOUNTER — Telehealth: Payer: Self-pay | Admitting: Orthopedic Surgery

## 2017-02-07 NOTE — Telephone Encounter (Signed)
I tried to call this lady yesterday afternoon to schedule her an appointment but had to leave a message.  Cassandra Lucas also said she had tried to call this lady last week.  A lady from Dr. Otilio Saber office called this morning regarding their referral for this patient.  I told her that we had been trying to call and had left messages. I also asked what exactly we were to see the patient for and she said whatever the problem was that she was seen in the ER for.  I checked the notes and the patient was seen for left ankle pain. Cassandra Lucas was in their office and so I spoke to her.  She said she wanted to be seen for her hip, knee, etc.  I told her that we were told she needed to be seen for her left ankle. She then told me that she would have to call me back because she had to leave for work.  I called back to Dr. Otilio Saber office and let them know of this.

## 2017-04-10 ENCOUNTER — Other Ambulatory Visit (HOSPITAL_COMMUNITY): Payer: Self-pay | Admitting: Family Medicine

## 2017-04-10 DIAGNOSIS — M545 Low back pain: Secondary | ICD-10-CM

## 2017-04-17 ENCOUNTER — Ambulatory Visit (HOSPITAL_COMMUNITY)
Admission: RE | Admit: 2017-04-17 | Discharge: 2017-04-17 | Disposition: A | Discharge status: Home / Self Care | Payer: Medicaid Other | Source: Ambulatory Visit | Attending: Family Medicine | Admitting: Family Medicine

## 2017-04-17 DIAGNOSIS — M47816 Spondylosis without myelopathy or radiculopathy, lumbar region: Secondary | ICD-10-CM | POA: Insufficient documentation

## 2017-04-17 DIAGNOSIS — M545 Low back pain: Secondary | ICD-10-CM | POA: Diagnosis present

## 2017-05-04 LAB — HEPATIC FUNCTION PANEL
ALT: 22 (ref 7–35)
AST: 29 (ref 13–35)
Alkaline Phosphatase: 126 — AB (ref 25–125)
BILIRUBIN, TOTAL: 0.9

## 2017-05-04 LAB — BASIC METABOLIC PANEL
BUN: 11 (ref 4–21)
Creatinine: 0.7 (ref <=1.1)
GLUCOSE: 112
Potassium: 4.5 (ref 3.4–5.3)
Sodium: 141 (ref 137–147)

## 2017-06-08 LAB — CBC AND DIFFERENTIAL
HEMATOCRIT: 41 (ref 36–46)
HEMOGLOBIN: 14.3 (ref 12.0–16.0)
PLATELETS: 275 (ref 150–399)
WBC: 10.8

## 2017-06-09 LAB — LIPID PANEL: Triglycerides: 134 (ref 40–160)

## 2017-12-05 ENCOUNTER — Ambulatory Visit (HOSPITAL_COMMUNITY)
Admission: RE | Admit: 2017-12-05 | Discharge: 2017-12-05 | Disposition: A | Discharge status: Home / Self Care | Payer: Medicaid Other | Source: Ambulatory Visit | Attending: Family Medicine | Admitting: Family Medicine

## 2017-12-05 ENCOUNTER — Other Ambulatory Visit (HOSPITAL_COMMUNITY): Payer: Self-pay | Admitting: Family Medicine

## 2017-12-05 ENCOUNTER — Other Ambulatory Visit (HOSPITAL_COMMUNITY)
Admission: RE | Admit: 2017-12-05 | Discharge: 2017-12-05 | Disposition: A | Discharge status: Home / Self Care | Payer: Medicaid Other | Source: Ambulatory Visit | Attending: Family Medicine | Admitting: Family Medicine

## 2017-12-05 DIAGNOSIS — I110 Hypertensive heart disease with heart failure: Secondary | ICD-10-CM | POA: Diagnosis not present

## 2017-12-05 DIAGNOSIS — M25551 Pain in right hip: Secondary | ICD-10-CM | POA: Diagnosis not present

## 2017-12-05 DIAGNOSIS — M069 Rheumatoid arthritis, unspecified: Secondary | ICD-10-CM | POA: Diagnosis present

## 2017-12-05 DIAGNOSIS — E1165 Type 2 diabetes mellitus with hyperglycemia: Secondary | ICD-10-CM | POA: Insufficient documentation

## 2017-12-05 DIAGNOSIS — D510 Vitamin B12 deficiency anemia due to intrinsic factor deficiency: Secondary | ICD-10-CM | POA: Diagnosis present

## 2017-12-05 DIAGNOSIS — M5136 Other intervertebral disc degeneration, lumbar region: Secondary | ICD-10-CM | POA: Diagnosis not present

## 2017-12-05 DIAGNOSIS — M25552 Pain in left hip: Secondary | ICD-10-CM | POA: Diagnosis not present

## 2017-12-05 DIAGNOSIS — N200 Calculus of kidney: Secondary | ICD-10-CM | POA: Diagnosis not present

## 2017-12-05 DIAGNOSIS — G8929 Other chronic pain: Secondary | ICD-10-CM | POA: Diagnosis not present

## 2017-12-05 DIAGNOSIS — R52 Pain, unspecified: Secondary | ICD-10-CM

## 2017-12-05 DIAGNOSIS — R760 Raised antibody titer: Secondary | ICD-10-CM | POA: Insufficient documentation

## 2017-12-05 LAB — SEDIMENTATION RATE: SED RATE: 7 mm/h (ref 0–22)

## 2017-12-05 LAB — CBC
HCT: 43.3 % (ref 36.0–46.0)
HEMOGLOBIN: 14.1 g/dL (ref 12.0–15.0)
MCH: 31.1 pg (ref 26.0–34.0)
MCHC: 32.6 g/dL (ref 30.0–36.0)
MCV: 95.4 fL (ref 78.0–100.0)
PLATELETS: 270 10*3/uL (ref 150–400)
RBC: 4.54 MIL/uL (ref 3.87–5.11)
RDW: 12.5 % (ref 11.5–15.5)
WBC: 10.6 10*3/uL — ABNORMAL HIGH (ref 4.0–10.5)

## 2017-12-05 LAB — COMPREHENSIVE METABOLIC PANEL
ALBUMIN: 3.7 g/dL (ref 3.5–5.0)
ALK PHOS: 71 U/L (ref 38–126)
ALT: 20 U/L (ref 14–54)
ANION GAP: 8 (ref 5–15)
AST: 16 U/L (ref 15–41)
BUN: 9 mg/dL (ref 6–20)
CO2: 27 mmol/L (ref 22–32)
CREATININE: 0.71 mg/dL (ref 0.44–1.00)
Calcium: 8.8 mg/dL — ABNORMAL LOW (ref 8.9–10.3)
Chloride: 103 mmol/L (ref 101–111)
GFR calc Af Amer: >60 mL/min (ref >60)
GFR calc non Af Amer: >60 mL/min (ref >60)
GLUCOSE: 176 mg/dL — AB (ref 65–99)
Potassium: 3.7 mmol/L (ref 3.5–5.1)
SODIUM: 138 mmol/L (ref 135–145)
TOTAL PROTEIN: 6.6 g/dL (ref 6.5–8.1)
Total Bilirubin: 0.6 mg/dL (ref 0.3–1.2)

## 2017-12-05 LAB — LIPID PANEL
CHOL/HDL RATIO: 5.8 ratio
Cholesterol: 209 mg/dL — ABNORMAL HIGH (ref 0–200)
HDL: 36 mg/dL — ABNORMAL LOW (ref >40)
LDL Cholesterol: 129 mg/dL — ABNORMAL HIGH (ref 0–99)
TRIGLYCERIDES: 220 mg/dL — AB (ref <150)
VLDL: 44 mg/dL — AB (ref 0–40)

## 2017-12-05 LAB — TSH: TSH: 1.895 u[IU]/mL (ref 0.350–4.500)

## 2017-12-06 LAB — RHEUMATOID FACTOR: Rhuematoid fact SerPl-aCnc: 16.4 IU/mL — ABNORMAL HIGH (ref 0.0–13.9)

## 2017-12-06 LAB — VITAMIN D 25 HYDROXY (VIT D DEFICIENCY, FRACTURES): Vit D, 25-Hydroxy: 25.3 ng/mL — ABNORMAL LOW (ref 30.0–100.0)

## 2017-12-06 LAB — HEMOGLOBIN A1C
HEMOGLOBIN A1C: 5.9 % — AB (ref 4.8–5.6)
Mean Plasma Glucose: 122.63 mg/dL

## 2017-12-06 LAB — ANTINUCLEAR ANTIBODIES, IFA: ANTINUCLEAR ANTIBODIES, IFA: NEGATIVE

## 2017-12-06 LAB — T4, FREE: FREE T4: 1.08 ng/dL (ref 0.61–1.12)

## 2018-01-23 ENCOUNTER — Ambulatory Visit (HOSPITAL_COMMUNITY)
Admission: RE | Admit: 2018-01-23 | Discharge: 2018-01-23 | Disposition: A | Discharge status: Home / Self Care | Payer: Medicaid Other | Source: Ambulatory Visit | Attending: Family Medicine | Admitting: Family Medicine

## 2018-01-23 ENCOUNTER — Telehealth: Payer: Self-pay | Admitting: Licensed Clinical Social Worker

## 2018-01-23 ENCOUNTER — Ambulatory Visit: Payer: Medicaid Other | Admitting: Family Medicine

## 2018-01-23 ENCOUNTER — Encounter: Payer: Self-pay | Admitting: Family Medicine

## 2018-01-23 ENCOUNTER — Other Ambulatory Visit: Payer: Self-pay

## 2018-01-23 ENCOUNTER — Other Ambulatory Visit (HOSPITAL_COMMUNITY)
Admission: RE | Admit: 2018-01-23 | Discharge: 2018-01-23 | Disposition: A | Discharge status: Home / Self Care | Payer: Medicaid Other | Source: Ambulatory Visit | Attending: Family Medicine | Admitting: Family Medicine

## 2018-01-23 VITALS — BP 117/83 | HR 115 | Temp 98.2°F | Resp 14 | Ht <= 58 in | Wt 209.0 lb

## 2018-01-23 DIAGNOSIS — R002 Palpitations: Secondary | ICD-10-CM | POA: Diagnosis not present

## 2018-01-23 DIAGNOSIS — J209 Acute bronchitis, unspecified: Secondary | ICD-10-CM | POA: Diagnosis not present

## 2018-01-23 DIAGNOSIS — H81399 Other peripheral vertigo, unspecified ear: Secondary | ICD-10-CM | POA: Diagnosis not present

## 2018-01-23 DIAGNOSIS — J440 Chronic obstructive pulmonary disease with acute lower respiratory infection: Secondary | ICD-10-CM | POA: Diagnosis not present

## 2018-01-23 DIAGNOSIS — H539 Unspecified visual disturbance: Secondary | ICD-10-CM | POA: Diagnosis not present

## 2018-01-23 DIAGNOSIS — R3915 Urgency of urination: Secondary | ICD-10-CM

## 2018-01-23 DIAGNOSIS — M791 Myalgia, unspecified site: Secondary | ICD-10-CM

## 2018-01-23 DIAGNOSIS — M2550 Pain in unspecified joint: Secondary | ICD-10-CM

## 2018-01-23 DIAGNOSIS — R5383 Other fatigue: Secondary | ICD-10-CM

## 2018-01-23 DIAGNOSIS — H532 Diplopia: Secondary | ICD-10-CM

## 2018-01-23 DIAGNOSIS — R05 Cough: Secondary | ICD-10-CM | POA: Diagnosis not present

## 2018-01-23 DIAGNOSIS — R413 Other amnesia: Secondary | ICD-10-CM | POA: Diagnosis not present

## 2018-01-23 DIAGNOSIS — R059 Cough, unspecified: Secondary | ICD-10-CM

## 2018-01-23 MED ORDER — PREDNISONE 20 MG PO TABS: ORAL_TABLET | ORAL | 0 refills | Status: DC

## 2018-01-23 NOTE — Progress Notes (Signed)
Patient ID: Cassandra Lucas, female    DOB: 06-06-1976, 42 y.o.   MRN: [id number]  Chief Complaint  Patient presents with  . Establish Care  . Memory Loss  . Fatigue  . Dizziness  . Mood changes  . Facial Swelling    also in arms, hands,legs,feet,etc  . Loss of Vision  . bladder urgency    Allergies Nsaids; Aspirin; and Macrobid [nitrofurantoin macrocrystal]  Subjective:   Cassandra Lucas is a 42 y.o. female who presents to [facility] today.  HPI 42 year old female who comes to the office with a history of multiple medical complaints.  She reports that she feels fatigued all the time and never feels rested.  She reports that several years she has suffered from confusion and memory loss.  She reports she chronically feels dizzy and lightheaded.  She reports that she suffers from problems with attention and feeling forgetful.  She reports that she feels weak at times in her upper and lower extremities.  She reports that her energy is so low that she is able to function normally.  She reports that she suffers from chronic numbness and tingling in her legs arms and hands.  She reports that these feelings are accompanied by lightning sharp pains and achiness in her upper and lower extremities.  She reports that she is constantly in pain and has tightness all over her body.  She reports that she has continual spasms and jerking in her extremities.  She reports that she sporadically gets swelling in her face, arms, hands, feet, and toes.  She reports at times she feels like that she cannot find the correct words that she needs to say.  She reports that she gets jerking of her mouth, jaw, and tongue.  She reports that she has frequent mood changes and her mood will constantly go from being sad to then feeling angry and irritable.  Reports that she has had trouble with her vision where she reports her vision seems to go blurry.  She also reports that she has had double vision.   She reports that her hearing is not good.  She reports that she cannot hold her bladder like she used to.  She reports that she has urinary urgency.  She reports that she feels that her body muscles and joints are so stiff that she cannot even wipe her bottom after she goes to the bathroom.  She reports that she cannot hold her arms up for more than a few minutes without feeling tired.  She reports she cannot walk or stand for more than a few minutes at a time because she feels too tired.  She reports that she feels overheated and hot constantly.  She reports that despite eating a good diet that she believes she has vitamin deficiencies.  She reports to me today that her rheumatoid factor is elevated.  She reports that she has posttraumatic stress disorder for many things that have happened in her life.  She reports that she has trouble walking and talking.  She reports tremors in her body.  She reports she has had headaches. Memory has been bad for a year. Will be in the middle of a sentence and forget words. Cannot remember well. Hard time spelling words now.  Does not look normal when walk, looks like limping.  She reports that all the symptoms been going on for many years.  She reports that she is told her doctor about all  of these in the past but he has told her to shut up and has not had any testing.  She comes in today for evaluation.  She reports that she is also had chest pain.  She reports she does feel short of breath at times.  She reports she occasionally has palpitations.  She does report that she is currently on an antibiotic.  She reports that she has a history of COPD.  She reports that for the past couple weeks she has been wheezing and having a cough.  She reports that she was given an antibiotic but has not gotten steroids.  She reports that she has had pneumonia in the past.  Review of her chest x-ray in the computer does reveal evidence of a pneumonia.  She did not have a repeat chest x-ray  after diagnosis of the pneumonia.   Past Medical History:  Diagnosis Date  . Anemia   . Anxiety   . Arthritis   . Carpal tunnel syndrome, bilateral 11/22/2015   Will try wrist splints  . Community acquired pneumonia   . COPD (chronic obstructive pulmonary disease) ([place])   . Decreased libido 03/26/2015  . Diabetes mellitus without complication (HCC)    prediabetes  . Dyslipidemia 11/24/2015  . Fibromyalgia   . Fibromyalgia   . Hot flashes 03/26/2015  . Hyperlipidemia   . Kidney stones   . Productive cough    smoker  . Renal disorder   . Vitamin D deficiency 11/24/2015    Past Surgical History:  Procedure Laterality Date  . ABDOMINAL HYSTERECTOMY     has ovaries.   Marland Kitchen BREAST CYST EXCISION    . CESAREAN SECTION  2006   [facility]  . LAPAROSCOPIC CHOLECYSTECTOMY  2002   [facility]  . LAPAROSCOPIC HYSTERECTOMY  04/12/2011   Procedure: HYSTERECTOMY TOTAL LAPAROSCOPIC;  Surgeon: Florian Buff, MD;  Location: [facility];  Service: Gynecology;  Laterality: N/A;  . THYROIDECTOMY, PARTIAL  2012   [facility], [facility]    Family History  Problem Relation Age of Onset  . Breast cancer Mother   . Diabetes Father   . Heart failure Father   . Migraines Sister   . Scoliosis Daughter   . ADD / ADHD Son   . Cancer Maternal Grandfather        pancreatic  . Diabetes Paternal Grandmother   . Congestive Heart Failure Paternal Grandmother   . ADD / ADHD Daughter   . Scoliosis Daughter   . Hyperlipidemia Daughter   . Diabetes Daughter        prediabetes  . Bipolar disorder Son   . ADD / ADHD Son      Social History   Socioeconomic History  . Marital status: Legally Separated    Spouse name: Not on file  . Number of children: 4  . Years of education: 11th grade  . Highest education level: Not on file  Occupational History  . Not on file  Social Needs  . Financial resource strain: Not on file  . Food insecurity:    Worry: Never true    Inability: Never true  . Transportation needs:    Medical: Yes     Non-medical: Yes  Tobacco Use  . Smoking status: Current Every Day Smoker    Packs/day: 0.50    Years: 17.00    Pack years: 8.50    Types: Cigarettes  . Smokeless tobacco: Never Used  Substance and Sexual Activity  . Alcohol use: No  . Drug  use: No  . Sexual activity: Not Currently    Birth control/protection: Surgical    Comment: hyst  Lifestyle  . Physical activity:    Days per week: 3 days    Minutes per session: Not on file  . Stress: Rather much  Relationships  . Social connections:    Talks on phone: Three times a week    Gets together: Never    Attends religious service: More than 4 times per year    Active member of club or organization: No    Attends meetings of clubs or organizations: Never    Relationship status: Separated  Other Topics Concern  . Not on file  Social History Narrative   Live with 3 children. Eats all food groups. Wear seatbelt.   No alcohol.     Review of Systems  Constitutional: Positive for activity change, fatigue, fever and unexpected weight change.       Feels like body is on fire. Feels hot all the time.   HENT: Positive for congestion, dental problem, facial swelling and hearing loss. Negative for drooling, ear pain, mouth sores, sneezing, sore throat, trouble swallowing and voice change.        Feels like hearing has been diminishing over the past bit.   Eyes: Positive for photophobia and visual disturbance.  Respiratory: Positive for cough, chest tightness, shortness of breath and wheezing.        Feels fluttering in heart has been going on for months. Has chest pain on and off for times. Chest pain lasts for minutes and comes and goes.   Coughs from time to time. Reports that has COPD. Is SOB on and off.   Cardiovascular: Positive for chest pain, palpitations and leg swelling.       Swelling in legs is worse with activity and the more active that she is.   Gastrointestinal: Positive for abdominal distention, abdominal pain, nausea  and vomiting. Negative for constipation and diarrhea.       Vomiting earlier in the week but not now. Has had blood in stool after taking naproxen and had tongue swelling. The blood stopped after stopping the NSAID, this occurred one month ago.   Endocrine: Positive for polyphagia and polyuria.  Genitourinary: Positive for frequency and urgency. Negative for difficulty urinating, dyspareunia, dysuria, flank pain, hematuria, vaginal bleeding, vaginal discharge and vaginal pain.  Musculoskeletal: Positive for arthralgias, back pain, gait problem, joint swelling, myalgias, neck pain and neck stiffness.  Skin: Negative for rash.  Neurological: Positive for dizziness, tremors, syncope, speech difficulty, weakness, light-headedness, numbness and headaches.       Reports that she might have had seizures. Reports that mouth jerks from time to time and her speech is not normal, this happens from time to time. Reports that recently, this has been going on a lot.   Hematological: Negative for adenopathy. Does not bruise/bleed easily.  Psychiatric/Behavioral: Positive for confusion and dysphoric mood. Negative for agitation, behavioral problems, decreased concentration, self-injury and suicidal ideas. The patient is nervous/anxious.    Depression screen [facility] 2/9 01/23/2018 11/30/2016  Decreased Interest 3 2  Down, Depressed, Hopeless 2 2  PHQ - 2 Score 5 4  Altered sleeping 3 2  Tired, decreased energy 3 3  Change in appetite 1 1  Feeling bad or failure about yourself  1 1  Trouble concentrating 2 2  Moving slowly or fidgety/restless 1 2  Suicidal thoughts 0 0  PHQ-9 Score 16 15  Difficult doing work/chores Very difficult -  Current Outpatient Medications on File Prior to Visit  Medication Sig Dispense Refill  . acetaminophen (TYLENOL) 500 MG tablet Take 1,000 mg by mouth daily as needed for mild pain.     Marland Kitchen albuterol (PROVENTIL) (2.5 MG/3ML) 0.083% nebulizer solution Take 2.5 mg by nebulization every 6  (six) hours as needed for wheezing or shortness of breath.    . cetirizine (ZYRTEC) 10 MG tablet Take 10 mg by mouth at bedtime.    . Cholecalciferol (VITAMIN D) 2000 units CAPS Take 1 capsule by mouth daily.    . Cimetidine (HEARTBURN RELIEF PO) Take 1 tablet by mouth daily as needed (heartburn).    . gabapentin (NEURONTIN) 300 MG capsule Take 300 mg by mouth 2 (two) times daily.    Marland Kitchen ipratropium (ATROVENT HFA) 17 MCG/ACT inhaler Inhale 1 puff into the lungs 4 (four) times daily.    . methocarbamol (ROBAXIN) 750 MG tablet Take 750 mg by mouth daily as needed for muscle spasms.    . Multiple Vitamin (MULTI-DAY VITAMINS PO) Take 1 tablet by mouth daily.    . Multiple Vitamins-Minerals (MULTIPLE VITAMINS/WOMENS PO) Take 1 tablet by mouth daily.    Marland Kitchen oxyCODONE (OXY IR/ROXICODONE) 5 MG immediate release tablet Take 5 mg by mouth every 6 (six) hours as needed. pain    . acetaminophen-codeine (TYLENOL #3) 300-30 MG tablet Take 1-2 tablets by mouth every 4 (four) hours as needed for moderate pain. (Patient not taking: Reported on 01/23/2018) 20 tablet 0  . albuterol (PROVENTIL HFA;VENTOLIN HFA) 108 (90 BASE) MCG/ACT inhaler Inhale 2 puffs into the lungs every 6 (six) hours as needed for wheezing or shortness of breath.    . Cholecalciferol 5000 units capsule Take 1 capsule (5,000 Units total) by mouth daily. (Patient not taking: Reported on 01/15/2017)     No current facility-administered medications on file prior to visit.      Objective:   BP 117/83 (BP Location: Right Arm, Patient Position: Sitting, Cuff Size: Large)   Pulse (!) 115 Comment: manual  Temp 98.2 F (36.8 C) (Temporal)   Resp 14   Ht 4' 9" (1.448 m)   Wt 209 lb 0.6 oz (94.8 kg)   LMP 01/31/2011   PF 96 L/min   BMI 45.24 kg/m   Physical Exam  Constitutional: She is oriented to person, place, and time. She appears well-developed and well-nourished. No distress.  HENT:  Head: Normocephalic and atraumatic.  Right Ear: External  ear normal.  Left Ear: External ear normal.  Nose: Nose normal.  Mouth/Throat: Oropharynx is clear and moist. No oropharyngeal exudate.  Eyes: Pupils are equal, round, and reactive to light. Conjunctivae and EOM are normal. Right eye exhibits no discharge. Left eye exhibits no discharge. No scleral icterus.  Neck: Normal range of motion. Neck supple. No JVD present. No tracheal deviation present. No thyromegaly present.  Cardiovascular: Normal rate, regular rhythm, normal heart sounds and intact distal pulses.  Pulmonary/Chest: Effort normal. No accessory muscle usage or stridor. No respiratory distress. She has wheezes. She has rhonchi. She has no rales. She exhibits no tenderness.  Abdominal: Soft. Bowel sounds are normal. She exhibits no distension and no mass. There is no tenderness.  Musculoskeletal: She exhibits tenderness. She exhibits no edema or deformity.  Lymphadenopathy:    She has no cervical adenopathy.  Neurological: She is alert and oriented to person, place, and time. She displays normal reflexes. No cranial nerve deficit or sensory deficit. She exhibits normal muscle tone. Coordination normal.  Skin:  Skin is warm and dry. Capillary refill takes less than 2 seconds.  Psychiatric: Her speech is normal. Judgment normal. Her mood appears anxious. Her affect is labile. Her affect is not inappropriate. She is slowed. Cognition and memory are normal. She expresses no homicidal and no suicidal ideation. She expresses no suicidal plans and no homicidal plans.  Patient does seem fixated on her health and her multiple complaints today.  She is anxious.  Her thought processes are slightly tangential.  She denies any suicidal or homicidal ideations.  Nursing note and vitals reviewed.  EKG done revealing sinus tachycardia.  No ST segment changes or T wave inversion. Albuterol nebulizer done with clearing of rhonchi and increased breath sounds.   Assessment and Plan  1. Diplopia Patient  reports that she has had multiple vision changes and complaints associated with memory loss, dizziness, vertigo, hearing loss and various paresthesias in her upper and lower extremities.  Will obtain some baseline studies from blood work today and also obtain MRI of the brain.  Will refer to ophthalmology for evaluation. - MR Brain W Wo Contrast; Future - Ambulatory referral to Ophthalmology  2. Memory loss - MR Brain W Wo Contrast; Future - RPR - B12 and Folate Panel  3. Other peripheral vertigo, unspecified ear Consider referral to ENT. - MR Brain W Wo Contrast; Future  4. Fatigue, unspecified type  - HIV antibody - CBC with Differential/Platelet - COMPLETE METABOLIC PANEL WITH GFR  5. Cough Patient reports chronic cough for years.  Will obtain chest x-ray. - DG Chest 2 View; Future  6. Polyarthralgia Check labs today. - ANA, IFA Comprehensive Panel - Rheumatoid Factor  7. Myalgia Check labs - Sed Rate (ESR) - CK (Creatine Kinase) - Magnesium  8. Palpitations Patient somewhat anxious in office today.  EKG was reviewed and discussed with patient. - EKG 12-Lead  9. Acute bronchitis with COPD ([place]) Patient does have some scattered rhonchi on exams.  Will treat for acute bronchitis at this time.  She is currently on an antibiotic.  She will complete the Zithromax as directed.  Will give steroid taper at this time.  She was counseled regarding any worrisome signs and symptoms and if those develop she needs to go to the emergency department. - predniSONE (DELTASONE) 20 MG tablet; Take 3 pills a day for 3 days, then 2 pills a day for 3 days, then 1 pill a day for 4 days, then 1/2 tablet a day for 4 days.  Dispense: 21 tablet; Refill: 0  10. Urinary urgency Check for UTI/urinary infection. - Urinalysis, Routine w reflex microscopic - Urine Culture - Urine cytology ancillary only  11. Vision changes - Ambulatory referral to Ophthalmology   Long discussion with patient  today regarding her multiple medical complaints and concerns.  Her review of system is basically positive for everything.  We discussed with her that she is having multiple complaints.  She has no known documented mental illness although I do believe there is some component of anxiety and mood disorder associated with her numerous medical complaints.  Patient does tell me today that she is not seeking disability.  I have told her that I do not complete paperwork for Social Security disability.  I have also told her that if she is going to be seen at this practice that she will need to be seen and evaluated by mental health.  The office visit today was greater than 1 hour.  I have tried to address her questions  and concerns.  However, I have also told her that her complaints, questions, and concerns are very numerous and these problems have been going on for an extended period of time.  I tried to reassure her that because of the length of time that these have been going on that I doubt at this point they are a serious threat to her health in the acute setting but I do agree that we can continue to try to work together and address her concerns. Tobacco cessation was recommended. Referral to behavioral health is placed today. We will request records from previous PCP/Dr. Diego Return in about 2 weeks (around 02/06/2018). Caren Macadam, MD 01/24/2018

## 2018-01-23 NOTE — Telephone Encounter (Signed)
Followed up with phone call as per Dr. Malena Peer request.  Left message to call back.

## 2018-01-24 ENCOUNTER — Other Ambulatory Visit: Payer: Self-pay | Admitting: Family Medicine

## 2018-01-24 ENCOUNTER — Telehealth: Payer: Self-pay | Admitting: Family Medicine

## 2018-01-24 DIAGNOSIS — R739 Hyperglycemia, unspecified: Secondary | ICD-10-CM

## 2018-01-24 LAB — URINE CYTOLOGY ANCILLARY ONLY
Chlamydia: NEGATIVE
Neisseria Gonorrhea: NEGATIVE
Trichomonas: NEGATIVE

## 2018-01-24 NOTE — Progress Notes (Unsigned)
Please advise patient that her blood sugars are elevated.  I would like for her to come back to the lab and get a hemoglobin A1c.  I

## 2018-01-24 NOTE — Telephone Encounter (Signed)
Please call patient and advised her to come back to the lab to get her blood sugars checked.  I am concerned because when she was in the office her blood sugars greater than 200.  She also had a good bit of blood sugar in her urine.  I am waiting on the urine culture to come back.  Please do asked patient to come in for the blood work fasting.

## 2018-01-24 NOTE — Telephone Encounter (Signed)
See previous message.  In addition advised her that the culture did come back and it does not show evidence of infection.  HIV, syphilis, B12, folate, sed rate, rheumatoid factor,

## 2018-01-25 ENCOUNTER — Telehealth: Payer: Self-pay | Admitting: Clinical

## 2018-01-25 ENCOUNTER — Encounter: Payer: Self-pay | Admitting: Family Medicine

## 2018-01-25 LAB — COMPLETE METABOLIC PANEL WITH GFR
AG Ratio: 1.9 (calc) (ref 1.0–2.5)
ALBUMIN MSPROF: 4.2 g/dL (ref 3.6–5.1)
ALT: 17 U/L (ref 6–29)
AST: 12 U/L (ref 10–30)
Alkaline phosphatase (APISO): 73 U/L (ref 33–115)
BUN: 12 mg/dL (ref 7–25)
CALCIUM: 9 mg/dL (ref 8.6–10.2)
CO2: 22 mmol/L (ref 20–32)
Chloride: 103 mmol/L (ref 98–110)
Creat: 0.74 mg/dL (ref 0.50–1.10)
GFR, Est African American: 117 mL/min/{1.73_m2} (ref >=60)
GFR, Est Non African American: 101 mL/min/{1.73_m2} (ref >=60)
GLOBULIN: 2.2 g/dL (ref 1.9–3.7)
Glucose, Bld: 257 mg/dL — ABNORMAL HIGH (ref 65–139)
POTASSIUM: 4.2 mmol/L (ref 3.5–5.3)
SODIUM: 137 mmol/L (ref 135–146)
Total Bilirubin: 0.3 mg/dL (ref 0.2–1.2)
Total Protein: 6.4 g/dL (ref 6.1–8.1)

## 2018-01-25 LAB — URINE CULTURE
MICRO NUMBER: 90563298
SPECIMEN QUALITY: ADEQUATE

## 2018-01-25 LAB — URINALYSIS, ROUTINE W REFLEX MICROSCOPIC
BACTERIA UA: NONE SEEN /HPF
Bilirubin Urine: NEGATIVE
Hyaline Cast: NONE SEEN /LPF
KETONES UR: NEGATIVE
LEUKOCYTES UA: NEGATIVE
Nitrite: NEGATIVE
PH: 6 (ref 5.0–8.0)
Protein, ur: NEGATIVE
RBC / HPF: NONE SEEN /HPF (ref 0–2)
Specific Gravity, Urine: 1.028 (ref 1.001–1.03)
Squamous Epithelial / LPF: NONE SEEN /HPF (ref <=5)
WBC UA: NONE SEEN /HPF (ref 0–5)

## 2018-01-25 LAB — CBC WITH DIFFERENTIAL/PLATELET
BASOS ABS: 53 {cells}/uL (ref 0–200)
BASOS PCT: 0.4 %
EOS ABS: 0 {cells}/uL — AB (ref 15–500)
EOS PCT: 0 %
HEMATOCRIT: 40.5 % (ref 35.0–45.0)
HEMOGLOBIN: 14.3 g/dL (ref 11.7–15.5)
LYMPHS ABS: 1091 {cells}/uL (ref 850–3900)
MCH: 32.1 pg (ref 27.0–33.0)
MCHC: 35.3 g/dL (ref 32.0–36.0)
MCV: 90.8 fL (ref 80.0–100.0)
MPV: 10.5 fL (ref 7.5–12.5)
Monocytes Relative: 2.2 %
NEUTROS ABS: 11864 {cells}/uL — AB (ref 1500–7800)
Neutrophils Relative %: 89.2 %
Platelets: 281 10*3/uL (ref 140–400)
RBC: 4.46 10*6/uL (ref 3.80–5.10)
RDW: 12.4 % (ref 11.0–15.0)
Total Lymphocyte: 8.2 %
WBC mixed population: 293 cells/uL (ref 200–950)
WBC: 13.3 10*3/uL — ABNORMAL HIGH (ref 3.8–10.8)

## 2018-01-25 LAB — ANA, IFA COMPREHENSIVE PANEL
Anti Nuclear Antibody(ANA): NEGATIVE
ENA SM AB SER-ACNC: NEGATIVE AI
SM/RNP: <1 AI
SSA (RO) (ENA) ANTIBODY, IGG: NEGATIVE AI
SSB (La) (ENA) Antibody, IgG: <1 AI
Scleroderma (Scl-70) (ENA) Antibody, IgG: <1 AI
ds DNA Ab: <1 IU/mL

## 2018-01-25 LAB — B12 AND FOLATE PANEL
Folate: 19.8 ng/mL
VITAMIN B 12: 675 pg/mL (ref 200–1100)

## 2018-01-25 LAB — RHEUMATOID FACTOR: Rhuematoid fact SerPl-aCnc: <14 IU/mL (ref <14)

## 2018-01-25 LAB — RPR: RPR Ser Ql: NONREACTIVE

## 2018-01-25 LAB — HIV ANTIBODY (ROUTINE TESTING W REFLEX): HIV 1&2 Ab, 4th Generation: NONREACTIVE

## 2018-01-25 LAB — SEDIMENTATION RATE: SED RATE: 9 mm/h (ref 0–20)

## 2018-01-25 LAB — CK: CK TOTAL: 63 U/L (ref 29–143)

## 2018-01-25 LAB — MAGNESIUM: MAGNESIUM: 1.8 mg/dL (ref 1.5–2.5)

## 2018-01-25 NOTE — Telephone Encounter (Signed)
This [facility] specialist left message to call back with name and contact information one one number but unable to leave a message on the primary number since no voicemail was set up.

## 2018-01-25 NOTE — Telephone Encounter (Signed)
Called patient and discussed message below with verbal understanding.

## 2018-01-29 ENCOUNTER — Telehealth: Payer: Self-pay | Admitting: Family Medicine

## 2018-01-29 LAB — BASIC METABOLIC PANEL
BUN: 13 mg/dL (ref 7–25)
CHLORIDE: 101 mmol/L (ref 98–110)
CO2: 27 mmol/L (ref 20–32)
CREATININE: 0.79 mg/dL (ref 0.50–1.10)
Calcium: 9.4 mg/dL (ref 8.6–10.2)
Glucose, Bld: 167 mg/dL — ABNORMAL HIGH (ref 65–139)
POTASSIUM: 4.5 mmol/L (ref 3.5–5.3)
Sodium: 138 mmol/L (ref 135–146)

## 2018-01-29 LAB — HEMOGLOBIN A1C
EAG (MMOL/L): 7.6 (calc)
Hgb A1c MFr Bld: 6.4 % of total Hgb — ABNORMAL HIGH (ref <5.7)
MEAN PLASMA GLUCOSE: 137 (calc)

## 2018-01-29 LAB — URINE CYTOLOGY ANCILLARY ONLY: Candida vaginitis: NEGATIVE

## 2018-01-29 MED ORDER — METRONIDAZOLE 500 MG PO TABS: 500.0000 mg | ORAL_TABLET | Freq: Two times a day (BID) | ORAL | 0 refills | Status: DC

## 2018-01-29 NOTE — Telephone Encounter (Signed)
Advised patient of results with verbal understanding. Reminded patient of her follow up appt. Encouraged patient to call back with any questions or concerns.

## 2018-01-29 NOTE — Telephone Encounter (Signed)
Please call patient advised that her urine testing did reveal evidence of bacterial vaginosis.  This is not considered a sexually transmitted infection but it is an overgrowth of bacteria that can cause vaginal/urologic symptoms.  She needs to take this medication twice a day for the next 7 days.  She cannot drink alcohol or consume products with alcohol in them while taking this medication or within 2 days of discontinuation of this medication due to possible side effects.  Please advise her to keep her follow-up visit to discuss her blood work.  In addition advised her that her blood sugars were elevated.  She is prediabetic and almost diabetic.  We will discuss this at the office visit.  However, until that time I would like for her to keep a diet record and to decrease her amount of sugar intake.  She needs to cut down on bread, rice, pasta, potatoes, and sugar sweetened beverages and processed sweets.  Please keep her scheduled follow-up.  Metronidazole 500 mg, 1 p.o. daily x7 days.was sent to [facility].

## 2018-01-30 ENCOUNTER — Telehealth: Payer: Self-pay

## 2018-01-30 NOTE — Telephone Encounter (Signed)
MRI scheduled for 5/20 @ 5pm patient needs to be there at 4:30 for registration. Patient notified with verbal understanding.

## 2018-02-04 ENCOUNTER — Ambulatory Visit (HOSPITAL_COMMUNITY)
Admission: RE | Admit: 2018-02-04 | Discharge: 2018-02-04 | Disposition: A | Discharge status: Home / Self Care | Payer: Medicaid Other | Source: Ambulatory Visit | Attending: Family Medicine | Admitting: Family Medicine

## 2018-02-04 DIAGNOSIS — H81399 Other peripheral vertigo, unspecified ear: Secondary | ICD-10-CM | POA: Diagnosis not present

## 2018-02-04 DIAGNOSIS — R413 Other amnesia: Secondary | ICD-10-CM | POA: Diagnosis not present

## 2018-02-04 DIAGNOSIS — H532 Diplopia: Secondary | ICD-10-CM | POA: Insufficient documentation

## 2018-02-04 MED ORDER — GADOBENATE DIMEGLUMINE 529 MG/ML IV SOLN
20.0000 mL | Freq: Once | INTRAVENOUS | Status: AC | PRN
  Administered 2018-02-04: 20 mL via INTRAVENOUS

## 2018-02-05 ENCOUNTER — Other Ambulatory Visit: Payer: Self-pay

## 2018-02-05 ENCOUNTER — Ambulatory Visit: Payer: Medicaid Other | Admitting: Family Medicine

## 2018-02-05 ENCOUNTER — Encounter: Payer: Self-pay | Admitting: Family Medicine

## 2018-02-05 VITALS — BP 124/80 | HR 112 | Temp 97.8°F | Resp 18 | Ht <= 58 in | Wt 210.1 lb

## 2018-02-05 DIAGNOSIS — R7303 Prediabetes: Secondary | ICD-10-CM

## 2018-02-05 DIAGNOSIS — M6289 Other specified disorders of muscle: Secondary | ICD-10-CM

## 2018-02-05 DIAGNOSIS — E236 Other disorders of pituitary gland: Secondary | ICD-10-CM | POA: Diagnosis not present

## 2018-02-05 DIAGNOSIS — M791 Myalgia, unspecified site: Secondary | ICD-10-CM

## 2018-02-05 DIAGNOSIS — F325 Major depressive disorder, single episode, in full remission: Secondary | ICD-10-CM | POA: Diagnosis not present

## 2018-02-05 MED ORDER — DULOXETINE HCL 30 MG PO CPEP: 30.0000 mg | ORAL_CAPSULE | Freq: Every day | ORAL | 0 refills | Status: DC

## 2018-02-05 MED ORDER — METFORMIN HCL 500 MG PO TABS: 500.0000 mg | ORAL_TABLET | Freq: Two times a day (BID) | ORAL | 3 refills | Status: DC
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
## 2018-02-22 ENCOUNTER — Encounter: Payer: Self-pay | Admitting: Family Medicine

## 2018-03-01 ENCOUNTER — Telehealth: Payer: Self-pay | Admitting: Family Medicine

## 2018-03-01 NOTE — Telephone Encounter (Signed)
No, she does not need a meter at this time. Needs to keep her follow up to discuss sugars and management. She is prediabetic not diabetic. Please advise.

## 2018-03-01 NOTE — Telephone Encounter (Signed)
Patient is requesting a glucose meter & strips. Please send to [facility]  Cb# [id number]/ [phone]

## 2018-03-04 ENCOUNTER — Other Ambulatory Visit: Payer: Self-pay | Admitting: Family Medicine

## 2018-03-04 NOTE — Telephone Encounter (Signed)
Called patient back. VM not set up. Will try again later.

## 2018-03-06 ENCOUNTER — Ambulatory Visit (INDEPENDENT_AMBULATORY_CARE_PROVIDER_SITE_OTHER): Payer: Self-pay | Admitting: Physical Medicine and Rehabilitation

## 2018-03-06 NOTE — Telephone Encounter (Signed)
Spoke with patient and advised her of Dr.Hagler's recommendations w/ verbal understanding. She understands to keep follow up appt to discuss sugars and management.

## 2018-03-25 ENCOUNTER — Telehealth: Payer: Self-pay

## 2018-03-25 NOTE — Telephone Encounter (Signed)
Writer left several messages without success. Writer will place the patient on the inactive list.  Writer routed this information to the PCP and Dr. Hisada.   

## 2018-03-26 ENCOUNTER — Encounter: Payer: Self-pay | Admitting: Family Medicine

## 2018-03-26 ENCOUNTER — Other Ambulatory Visit: Payer: Self-pay

## 2018-03-26 ENCOUNTER — Ambulatory Visit (INDEPENDENT_AMBULATORY_CARE_PROVIDER_SITE_OTHER): Payer: Medicaid Other | Admitting: Family Medicine

## 2018-03-26 ENCOUNTER — Other Ambulatory Visit (HOSPITAL_COMMUNITY)
Admission: RE | Admit: 2018-03-26 | Discharge: 2018-03-26 | Disposition: A | Discharge status: Home / Self Care | Payer: Medicaid Other | Source: Ambulatory Visit | Attending: Family Medicine | Admitting: Family Medicine

## 2018-03-26 VITALS — BP 134/92 | HR 100 | Temp 97.9°F | Resp 22 | Ht 59.0 in | Wt 203.1 lb

## 2018-03-26 DIAGNOSIS — Z23 Encounter for immunization: Secondary | ICD-10-CM | POA: Diagnosis not present

## 2018-03-26 DIAGNOSIS — I10 Essential (primary) hypertension: Secondary | ICD-10-CM

## 2018-03-26 DIAGNOSIS — F325 Major depressive disorder, single episode, in full remission: Secondary | ICD-10-CM

## 2018-03-26 DIAGNOSIS — E1169 Type 2 diabetes mellitus with other specified complication: Secondary | ICD-10-CM

## 2018-03-26 DIAGNOSIS — R3915 Urgency of urination: Secondary | ICD-10-CM | POA: Diagnosis not present

## 2018-03-26 DIAGNOSIS — N898 Other specified noninflammatory disorders of vagina: Secondary | ICD-10-CM | POA: Insufficient documentation

## 2018-03-26 MED ORDER — DULOXETINE HCL 60 MG PO CPEP: 60.0000 mg | ORAL_CAPSULE | Freq: Every day | ORAL | 0 refills | Status: DC

## 2018-03-26 MED ORDER — LISINOPRIL 10 MG PO TABS
10.0000 mg | ORAL_TABLET | Freq: Every day | ORAL | 3 refills | Status: DC
Start: 2018-03-26 — End: 2021-07-28

## 2018-03-26 NOTE — Progress Notes (Signed)
Patient ID: Cassandra Lucas, female    DOB: Jul 12, 1976, 42 y.o.   MRN: [id number]  Chief Complaint  Patient presents with  . Depression    follow up    Allergies Nsaids; Aspirin; and Macrobid [nitrofurantoin macrocrystal]  Subjective:   Cassandra Lucas is a 42 y.o. female who presents to [facility] today.  HPI Here for follow up. Reports that a couple weeks ago was seen at the [facility] for UTI/kidney infection. Completed the antibiotics and is not having any dysuria. Reports that still does have some bladder pressure. No subsequent fever, chills, nauseated. Eating and drinking well. Was seen at [facility] on [address] and reports is better. Reports that was told that she had RBC and WBC in her urine but then believes she was called back and told the culture was normal. Not sexually active at this time but would like to be checked for any sexually transmitted infections.. No vaginal discharge that is abnormal.  She reports she occasionally has some vaginal discharge.  Denies any vaginal itching.  Believes her vaginal discharge is just related to her hormones.  Does not have any vaginal odor.  Reports that does occasionally wear a panty liner. No vaginal bleeding. Has ovaries but had hysterectomy due to irregular menses/no cancerous issues.  Bowel movements are normal.  Denies any melena or bright red blood per rectum.  Reports that she has not seen any blood in her urine since she was treated for the urinary tract infection.  Reports that has not been working much due to not being able to handle the heat. 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 Reports that has not been to see the neurologist but has an upcoming appointment. Has been taking the cymbalta. Reports that she cannot tell that it has helped her mood. Reports that has more stress b/c is having some financial stressors now b/c of job.   Reports that body hurts. Reports that  gets swelling in feet, hands, and face at times if she over exerts herself. Reports that her joints pop and crack in hands, elbows, toes, and whole body is cracking. Has pain with it too. Believes that cymbalta has helped with some of the joint pain and can move a bit better.   Has been taking the metformin as directed. No problems. Reports that after taking the metformin, noticed her movements were better.  I told patient that I saw there was a referral in the chart for physical therapy which was placed by Dr. Cecelia Byars, her previous primary care physician before seeing me.  Reports that she did go and see her previous PCP/Dr. Diego in hopes that he would give her some pain medication.  She reports that she does use oxycodone for pain and would like a refill of this medication.  She reports that he would not give her any in hopes that I would today.  She reports that she is going to follow up with him in the future and has an upcoming visit with him in the next several weeks. Is going to see the neurologist. Has upcoming appointment with Dr. Lennice Sites specialist.  Her pain is not changed in multiple years.  Reports that she has muscle aches and pains all over her body.  She reports that her muscles feel tight.  Denies any numbness or tingling.  She denies any current headaches.  Vision is normal. Blood pressure is elevated today.  She does smoke cigarettes.  Reports that has been elevated in the past.  Denies any chest pain or shortness of breath. Last A1c 1 month ago was elevated at 6.5%.  She has been taking the metformin as directed and reports that she feels better.  Does not have a meter.  Would be interested in going to diabetic education.  Understands that she needs to lose weight.  Has not been motivated to make changes in her diet.    Past Medical History:  Diagnosis Date  . Anemia   . Anxiety   . Arthritis   . Carpal tunnel syndrome, bilateral 11/22/2015   Will try wrist splints  . Community  acquired pneumonia   . COPD (chronic obstructive pulmonary disease) (HCC)   . Decreased libido 03/26/2015  . Diabetes mellitus without complication (HCC)    prediabetes  . Dyslipidemia 11/24/2015  . Fibromyalgia   . Fibromyalgia   . Hot flashes 03/26/2015  . Hyperlipidemia   . Kidney stones   . Productive cough    smoker  . Renal disorder   . Vitamin D deficiency 11/24/2015    Past Surgical History:  Procedure Laterality Date  . ABDOMINAL HYSTERECTOMY     has ovaries.   Marland Kitchen BREAST CYST EXCISION    . CESAREAN SECTION  2006   [facility]  . LAPAROSCOPIC CHOLECYSTECTOMY  2002   [facility]  . LAPAROSCOPIC HYSTERECTOMY  04/12/2011   Procedure: HYSTERECTOMY TOTAL LAPAROSCOPIC;  Surgeon: Lazaro Arms, MD;  Location: [facility];  Service: Gynecology;  Laterality: N/A;  . THYROIDECTOMY, PARTIAL  2012   [facility], [facility]    Family History  Problem Relation Age of Onset  . Breast cancer Mother   . Diabetes Father   . Heart failure Father   . Migraines Sister   . Scoliosis Daughter   . ADD / ADHD Son   . Cancer Maternal Grandfather        pancreatic  . Diabetes Paternal Grandmother   . Congestive Heart Failure Paternal Grandmother   . ADD / ADHD Daughter   . Scoliosis Daughter   . Hyperlipidemia Daughter   . Diabetes Daughter        prediabetes  . Bipolar disorder Son   . ADD / ADHD Son      Social History   Socioeconomic History  . Marital status: Legally Separated    Spouse name: Not on file  . Number of children: 4  . Years of education: 11th grade  . Highest education level: Not on file  Occupational History  . Not on file  Social Needs  . Financial resource strain: Not on file  . Food insecurity:    Worry: Never true    Inability: Never true  . Transportation needs:    Medical: Yes    Non-medical: Yes  Tobacco Use  . Smoking status: Current Every Day Smoker    Packs/day: 0.50    Years: 17.00    Pack years: 8.50    Types: Cigarettes  . Smokeless tobacco: Never Used  Substance and  Sexual Activity  . Alcohol use: No  . Drug use: No  . Sexual activity: Not Currently    Birth control/protection: Surgical    Comment: hyst  Lifestyle  . Physical activity:    Days per week: 3 days    Minutes per session: Not on file  . Stress: Rather much  Relationships  . Social connections:    Talks on phone: Three times a week  Gets together: Never    Attends religious service: More than 4 times per year    Active member of club or organization: No    Attends meetings of clubs or organizations: Never    Relationship status: Separated  Other Topics Concern  . Not on file  Social History Narrative   Live with 3 children. Eats all food groups. Wear seatbelt.   No alcohol.    Current Outpatient Medications on File Prior to Visit  Medication Sig Dispense Refill  . acetaminophen (TYLENOL) 500 MG tablet Take 1,000 mg by mouth daily as needed for mild pain.     Marland Kitchen albuterol (PROVENTIL HFA;VENTOLIN HFA) 108 (90 BASE) MCG/ACT inhaler Inhale 2 puffs into the lungs every 6 (six) hours as needed for wheezing or shortness of breath.    Marland Kitchen albuterol (PROVENTIL) (2.5 MG/3ML) 0.083% nebulizer solution Take 2.5 mg by nebulization every 6 (six) hours as needed for wheezing or shortness of breath.    . Cholecalciferol (VITAMIN D) 2000 units CAPS Take 1 capsule by mouth daily.    . Cholecalciferol 5000 units capsule Take 1 capsule (5,000 Units total) by mouth daily.    . Cimetidine (HEARTBURN RELIEF PO) Take 1 tablet by mouth daily as needed (heartburn).    . gabapentin (NEURONTIN) 300 MG capsule Take 300 mg by mouth 2 (two) times daily.    Marland Kitchen ipratropium (ATROVENT HFA) 17 MCG/ACT inhaler Inhale 1 puff into the lungs 4 (four) times daily.    . metFORMIN (GLUCOPHAGE) 500 MG tablet Take 1 tablet (500 mg total) by mouth 2 (two) times daily with a meal. 180 tablet 3  . methocarbamol (ROBAXIN) 750 MG tablet Take 750 mg by mouth daily as needed for muscle spasms.    . Multiple Vitamin (MULTI-DAY  VITAMINS PO) Take 1 tablet by mouth daily.    . Multiple Vitamins-Minerals (MULTIPLE VITAMINS/WOMENS PO) Take 1 tablet by mouth daily.    Marland Kitchen oxyCODONE (OXY IR/ROXICODONE) 5 MG immediate release tablet Take 5 mg by mouth every 6 (six) hours as needed. pain     No current facility-administered medications on file prior to visit.     Review of Systems  Constitutional: Negative for activity change, appetite change, chills, fever and unexpected weight change.  Eyes: Negative for visual disturbance.  Respiratory: Negative for cough, chest tightness and shortness of breath.   Cardiovascular: Negative for chest pain, palpitations and leg swelling.  Gastrointestinal: Negative for abdominal pain, nausea and vomiting.  Endocrine: Negative for polyphagia and polyuria.  Genitourinary: Negative for difficulty urinating, dysuria, flank pain, frequency, pelvic pain, urgency, vaginal bleeding and vaginal pain.  Musculoskeletal: Positive for arthralgias, back pain, gait problem and myalgias. Negative for neck stiffness.  Neurological: Negative for dizziness, tremors, syncope, speech difficulty, weakness, light-headedness, numbness and headaches.  Hematological: Negative for adenopathy.  Psychiatric/Behavioral: Positive for dysphoric mood. Negative for behavioral problems, self-injury and suicidal ideas. The patient is nervous/anxious.      Objective:   BP (!) 134/92 (BP Location: Left Arm, Patient Position: Sitting, Cuff Size: Large)   Pulse 100   Temp 97.9 F (36.6 C) (Temporal)   Resp (!) 22   Ht 4\' 11"  (1.499 m)   Wt 203 lb 1.9 oz (92.1 kg)   LMP 01/31/2011   SpO2 97%   BMI 41.03 kg/m   Physical Exam  Constitutional: She appears well-developed and well-nourished.  Cardiovascular: Normal rate, regular rhythm and normal heart sounds.  Pulmonary/Chest: Effort normal and breath sounds normal.  Abdominal: Soft. Bowel  sounds are normal. She exhibits no distension. There is no tenderness.  No CVA  tenderness to palpation.  Psychiatric: Her speech is normal and behavior is normal. Judgment and thought content normal. Cognition and memory are normal. She expresses no homicidal and no suicidal ideation. She expresses no suicidal plans and no homicidal plans.  Dysthymic mood.  Affect congruent with mood.  Vitals reviewed.   Depression screen [facility] 2/9 03/26/2018 02/05/2018 01/23/2018 11/30/2016  Decreased Interest 2 2 3 2   Down, Depressed, Hopeless 2 2 2 2   PHQ - 2 Score 4 4 5 4   Altered sleeping 2 2 3 2   Tired, decreased energy 3 3 3 3   Change in appetite 1 2 1 1   Feeling bad or failure about yourself  2 1 1 1   Trouble concentrating 2 2 2 2   Moving slowly or fidgety/restless 1 2 1 2   Suicidal thoughts 0 0 0 0  PHQ-9 Score 15 16 16 15   Difficult doing work/chores Very difficult Very difficult Very difficult -    Assessment and Plan  1. Type 2 diabetes mellitus with other specified complication, without long-term current use of insulin (HCC) Diet, exercise, and weight loss modifications recommended.  Start lisinopril 10 mg p.o. daily.  Defer statins due to muscle aches at this time. - Referral to [facility] Services - Pneumococcal conjugate vaccine 13-valent IM  2. Urinary urgency Recent urinary tract infection with some intermittent persistent urgency issues.  Will check for UTI at this time. - Urine Culture - Urinalysis, Routine w reflex microscopic  3. Vaginal discharge She reports occasional vaginal discharge which she believes is normal but would like to be screened for any sexually transmitted infections.  Screening vaginally performed.  Defers HIV or hepatitis testing. - Urine cytology ancillary only  4. Essential hypertension Lifestyle modifications discussed with patient including a diet emphasizing vegetables, fruits, and whole grains. Limiting intake of sodium to less than 2,400 mg per day.  Recommendations discussed include consuming low-fat dairy products,  poultry, fish, legumes, non-tropical vegetable oils, and nuts; and limiting intake of sweets, sugar-sweetened beverages, and red meat. Discussed following a plan such as the Dietary Approaches to Stop Hypertension (DASH) diet. Patient to read up on this diet.  Patient counseled in detail regarding the risks of medication. Told to call or return to clinic if develop any worrisome signs or symptoms. Patient voiced understanding.   - lisinopril (PRINIVIL,ZESTRIL) 10 MG tablet; Take 1 tablet (10 mg total) by mouth daily.  Dispense: 90 tablet; Refill: 3 Discussed with patient that she needs to have BMP checked in 2 to 4 weeks.  She reports she will have this done at her new/previous PCP, Dr. . 5. Depression, major, single episode, complete remission (HCC) Increase Cymbalta at this time.  I do believe this will help with her pain and her mood disorder.  She was counseled that she needs to follow-up or call 911 if she develops any mood problems. Suicide risks evaluated and documented in note if present or in the area below.  Patient has protective factors of family and community support.  Patient reports that family believes is behaving rationally. Patient displays problem solving skills.   Patient specifically denies suicide ideation. Patient has access/information to healthcare contacts if situation or mood changes where patient is a risk to self or others or mood becomes unstable.   During the encounter, the patient had good eye contact and firm handshake regarding safety contract and agreement to seek help if  mood worsens and not to harm self.   Patient understands the treatment plan and is in agreement. Agrees to keep follow up and call prior or return to clinic if needed.  Patient counseled in detail regarding the risks of medication. Told to call or return to clinic if develop any worrisome signs or symptoms. Patient voiced understanding.   - DULoxetine (CYMBALTA) 60 MG capsule; Take 1  capsule (60 mg total) by mouth daily.  Dispense: 30 capsule; Refill: 0  Discussion with patient today that I will not start her on any narcotic pain medications.  She needs to keep her scheduled appointment with neurology and back specialist.  She voiced understanding.  She will follow-up with Dr. Cecelia Byars.  No follow-ups on file.  Patient will follow-up with her previous PCP.  She Artie has an appointment scheduled. Aliene Beams, MD 03/26/2018

## 2018-03-26 NOTE — Patient Instructions (Signed)
Keep appointment with Dr. Delbert Harness on 04/17/2018 Keep appointment with Dr. Alvester Morin and with neurology/Dr. Terrace Arabia

## 2018-03-26 NOTE — Progress Notes (Signed)
Patient ID: Cassandra Lucas, female    DOB: [birth year], 42 y.o.   MRN: [id number]  Chief Complaint  Patient presents with  . Depression    follow up    Allergies Nsaids; Aspirin; and Macrobid [nitrofurantoin macrocrystal]  Subjective:   Cassandra Lucas is a 42 y.o. female who presents to [facility] today.  HPI HPI  Past Medical History:  Diagnosis Date  . Anemia   . Anxiety   . Arthritis   . Carpal tunnel syndrome, bilateral 11/22/2015   Will try wrist splints  . Community acquired pneumonia   . COPD (chronic obstructive pulmonary disease) (HCC)   . Decreased libido 03/26/2015  . Diabetes mellitus without complication (HCC)    prediabetes  . Dyslipidemia 11/24/2015  . Fibromyalgia   . Fibromyalgia   . Hot flashes 03/26/2015  . Hyperlipidemia   . Kidney stones   . Productive cough    smoker  . Renal disorder   . Vitamin D deficiency 11/24/2015    Past Surgical History:  Procedure Laterality Date  . ABDOMINAL HYSTERECTOMY     has ovaries.   Marland Kitchen BREAST CYST EXCISION    . CESAREAN SECTION  2006   [facility]  . LAPAROSCOPIC CHOLECYSTECTOMY  2002   [facility]  . LAPAROSCOPIC HYSTERECTOMY  04/12/2011   Procedure: HYSTERECTOMY TOTAL LAPAROSCOPIC;  Surgeon: Lazaro Arms, MD;  Location: [facility];  Service: Gynecology;  Laterality: N/A;  . THYROIDECTOMY, PARTIAL  2012   [facility], [facility]    Family History  Problem Relation Age of Onset  . Breast cancer Mother   . Diabetes Father   . Heart failure Father   . Migraines Sister   . Scoliosis Daughter   . ADD / ADHD Son   . Cancer Maternal Grandfather        pancreatic  . Diabetes Paternal Grandmother   . Congestive Heart Failure Paternal Grandmother   . ADD / ADHD Daughter   . Scoliosis Daughter   . Hyperlipidemia Daughter   . Diabetes Daughter        prediabetes  . Bipolar disorder Son   . ADD / ADHD Son      Social History   Socioeconomic History  . Marital status: Legally Separated    Spouse name: Not on  file  . Number of children: 4  . Years of education: 11th grade  . Highest education level: Not on file  Occupational History  . Not on file  Social Needs  . Financial resource strain: Not on file  . Food insecurity:    Worry: Never true    Inability: Never true  . Transportation needs:    Medical: Yes    Non-medical: Yes  Tobacco Use  . Smoking status: Current Every Day Smoker    Packs/day: 0.50    Years: 17.00    Pack years: 8.50    Types: Cigarettes  . Smokeless tobacco: Never Used  Substance and Sexual Activity  . Alcohol use: No  . Drug use: No  . Sexual activity: Not Currently    Birth control/protection: Surgical    Comment: hyst  Lifestyle  . Physical activity:    Days per week: 3 days    Minutes per session: Not on file  . Stress: Rather much  Relationships  . Social connections:    Talks on phone: Three times a week    Gets together: Never    Attends religious service: More than 4 times per year  Active member of club or organization: No    Attends meetings of clubs or organizations: Never    Relationship status: Separated  Other Topics Concern  . Not on file  Social History Narrative   Live with 3 children. Eats all food groups. Wear seatbelt.   No alcohol.     Review of Systems   Objective:   BP (!) 134/92 (BP Location: Left Arm, Patient Position: Sitting, Cuff Size: Large)   Pulse 100   Temp 97.9 F (36.6 C) (Temporal)   Resp (!) 22   Ht 4\' 11"  (1.499 m)   Wt 203 lb 1.9 oz (92.1 kg)   LMP 01/31/2011   SpO2 97%   BMI 41.03 kg/m   Physical Exam   Assessment and Plan   There are no diagnoses linked to this encounter.   No follow-ups on file. 02/02/2011, CMA 03/26/2018

## 2018-03-27 LAB — URINALYSIS, ROUTINE W REFLEX MICROSCOPIC
BACTERIA UA: NONE SEEN /HPF
Bilirubin Urine: NEGATIVE
Glucose, UA: NEGATIVE
HYALINE CAST: NONE SEEN /LPF
KETONES UR: NEGATIVE
Leukocytes, UA: NEGATIVE
Nitrite: NEGATIVE
PH: 7 (ref 5.0–8.0)
Protein, ur: NEGATIVE
RBC / HPF: NONE SEEN /HPF (ref 0–2)
SPECIFIC GRAVITY, URINE: 1.01 (ref 1.001–1.03)
WBC, UA: NONE SEEN /HPF (ref 0–5)

## 2018-03-27 LAB — URINE CULTURE
MICRO NUMBER: 90812148
SPECIMEN QUALITY: ADEQUATE

## 2018-03-28 LAB — URINE CYTOLOGY ANCILLARY ONLY
Chlamydia: NEGATIVE
Neisseria Gonorrhea: NEGATIVE
TRICH (WINDOWPATH): NEGATIVE

## 2018-03-29 ENCOUNTER — Encounter: Payer: Self-pay | Admitting: Family Medicine

## 2018-03-29 LAB — URINE CYTOLOGY ANCILLARY ONLY: CANDIDA VAGINITIS: NEGATIVE

## 2018-04-02 ENCOUNTER — Other Ambulatory Visit: Payer: Self-pay

## 2018-04-02 ENCOUNTER — Encounter (HOSPITAL_COMMUNITY): Payer: Self-pay | Admitting: Physical Therapy

## 2018-04-02 ENCOUNTER — Telehealth: Payer: Self-pay | Admitting: Family Medicine

## 2018-04-02 ENCOUNTER — Ambulatory Visit (HOSPITAL_COMMUNITY): Payer: Medicaid Other | Attending: Family Medicine | Admitting: Physical Therapy

## 2018-04-02 DIAGNOSIS — R2689 Other abnormalities of gait and mobility: Secondary | ICD-10-CM | POA: Insufficient documentation

## 2018-04-02 DIAGNOSIS — G8929 Other chronic pain: Secondary | ICD-10-CM | POA: Insufficient documentation

## 2018-04-02 DIAGNOSIS — M545 Low back pain: Secondary | ICD-10-CM | POA: Diagnosis not present

## 2018-04-02 MED ORDER — METRONIDAZOLE 500 MG PO TABS: 500.0000 mg | ORAL_TABLET | Freq: Two times a day (BID) | ORAL | 0 refills | Status: DC

## 2018-04-02 NOTE — Telephone Encounter (Signed)
Please call patient and advise that her tests did reveal that she has a bacterial vaginal infection called bacterial vaginosis. This is the cause of her vaginal discharge/symptoms.  Bacterial vaginosis is caused by an overgrowth of bacteria in the vaginal area. Advise that she will need to take an antibiotic twice a day for the next seven days, called Metronidazole 500 mg bid for 7 days. Advise that she should not drink any alcohol while taking this medication or within three days of stopping this medication or it will make her sick.  Medication sent o pharmacy.

## 2018-04-02 NOTE — Telephone Encounter (Signed)
Left generic message requesting call back to discuss recent lab results.
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
## 2018-04-03 NOTE — Telephone Encounter (Signed)
Advised patient of recent lab results with verbal understanding. She stated she was just treated for BV a couple of months ago and is wondering why she has it again. I told her I would send Dr.Hagler a message asking for advise and would give her a call when I got a response.

## 2018-04-04 ENCOUNTER — Ambulatory Visit (INDEPENDENT_AMBULATORY_CARE_PROVIDER_SITE_OTHER): Payer: Medicaid Other | Admitting: Physical Medicine and Rehabilitation

## 2018-04-04 VITALS — BP 139/79 | HR 113 | Temp 98.5°F | Ht <= 58 in | Wt 203.0 lb

## 2018-04-04 DIAGNOSIS — M5441 Lumbago with sciatica, right side: Secondary | ICD-10-CM

## 2018-04-04 DIAGNOSIS — M797 Fibromyalgia: Secondary | ICD-10-CM

## 2018-04-04 DIAGNOSIS — G894 Chronic pain syndrome: Secondary | ICD-10-CM

## 2018-04-04 DIAGNOSIS — G8929 Other chronic pain: Secondary | ICD-10-CM

## 2018-04-04 DIAGNOSIS — R202 Paresthesia of skin: Secondary | ICD-10-CM

## 2018-04-04 DIAGNOSIS — M25552 Pain in left hip: Secondary | ICD-10-CM

## 2018-04-04 DIAGNOSIS — M25551 Pain in right hip: Secondary | ICD-10-CM

## 2018-04-04 DIAGNOSIS — M5442 Lumbago with sciatica, left side: Secondary | ICD-10-CM

## 2018-04-04 DIAGNOSIS — M542 Cervicalgia: Secondary | ICD-10-CM | POA: Diagnosis not present

## 2018-04-04 NOTE — Progress Notes (Signed)
 .  Numeric Pain Rating Scale and Functional Assessment Average Pain 9 Pain Right Now 8 My pain is constant, sharp and stabbing Pain is worse with: walking, bending, sitting and some activites Pain improves with: rest   In the last MONTH (on 0-10 scale) has pain interfered with the following?  1. General activity like being  able to carry out your everyday physical activities such as walking, climbing stairs, carrying groceries, or moving a chair?  Rating(9)  2. Relation with others like being able to carry out your usual social activities and roles such as  activities at home, at work and in your community. Rating(7)  3. Enjoyment of life such that you have  been bothered by emotional problems such as feeling anxious, depressed or irritable?  Rating(7)

## 2018-04-06 NOTE — Telephone Encounter (Signed)
Advise that it could be related to the fact that it did not get cleared up. She should avoid taking frequent baths, douching, or using vaginal wash preparations that can "wipe out" the healthy vaginal bacteria and result in an overgrowth of bacteria that can cause odor or discharge. Cassandra Lucas. [name], MD

## 2018-04-09 ENCOUNTER — Encounter: Payer: Self-pay | Admitting: Family Medicine

## 2018-04-09 ENCOUNTER — Ambulatory Visit: Payer: Self-pay | Admitting: Neurology

## 2018-04-09 ENCOUNTER — Encounter (INDEPENDENT_AMBULATORY_CARE_PROVIDER_SITE_OTHER): Payer: Self-pay | Admitting: Physical Medicine and Rehabilitation

## 2018-04-09 NOTE — Telephone Encounter (Signed)
Called patient to discuss Dr.Hagler's recommendations. No answer unable to leave vm because no vm set up

## 2018-04-09 NOTE — Progress Notes (Signed)
Cassandra Lucas - 42 y.o. female MRN [id number]  Date of birth: Oct 10, 1975  Office Visit Note: Visit Date: 04/04/2018 PCP: Aliene Beams, MD Referred by: Aliene Beams, MD  Subjective: Chief Complaint  Patient presents with  . Neck - Pain  . Lower Back - Pain  . Right Leg - Pain, Numbness, Tingling  . Left Leg - Pain, Numbness, Tingling  . Right Arm - Pain, Numbness, Tingling  . Left Arm - Pain, Numbness, Tingling  . Right Hip - Pain   HPI: Cassandra Lucas is a 42 year old female who is accompanied by her daughter.  She comes in today at the request of Dr. Aliene Beams for evaluation and possible management of her chronic neck and upper back and low back pain.  She really suffers from chronic pain syndrome and tells me that she initially was under the care of Dr. Oval Linsey in [place] who prescribed oxycodone that seem to be helpful for several years.  She reports that he eventually diagnosed her with fibromyalgia and felt like it was inappropriate to treat this with opioids.  She then was seen by Dr. [name] who has started treating her with Cymbalta she has just recently moved to a 60 mg dose.  She is also taking gabapentin 300 mg twice a day.  She has had some chiropractic care in the past but this made her symptoms worse.  She has had physical therapy on some scale which also made things worse.  She has not had any massage therapy.  Today she reports average pain of 9 out of 10 which is constant sharp and stabbing pain.  This is primarily in the neck and both shoulders and both arms and across the upper back and shoulder blades.  She gets numbness and tingling into both hands and all digits in all fingertips in a nondermatomal pattern.  She also gets pain in the lower back that radiates more to the right hip and groin than the left but down both legs with numbness tingling in a nondermatomal pattern into the feet.  She reports this ongoing for many years without specific trauma.   She reports increasing activity makes her pain worse.  She so far is found nothing that seems to help.  She in the office moves with great difficulty even very small movements elicit a chronic ingrown type of pain.  She reports that it affects her daily life in terms of general activity and relations with others.  She has not had cognitive behavioral therapy or pain psychology.  She has had a full work-up of cervical MRI and lumbar MRI which are essentially normal for her age.  These are reviewed with the patient and reviewed below.  We did go over the images and spine models today.  She also has had lab work for rheumatologic screening which was all negative as well.   Review of Systems  Constitutional: Negative for chills, fever, malaise/fatigue and weight loss.  HENT: Negative for hearing loss and sinus pain.   Eyes: Negative for blurred vision, double vision and photophobia.  Respiratory: Negative for cough and shortness of breath.   Cardiovascular: Negative for chest pain, palpitations and leg swelling.  Gastrointestinal: Negative for abdominal pain, nausea and vomiting.  Genitourinary: Negative for flank pain.  Musculoskeletal: Positive for back pain, joint pain and neck pain. Negative for myalgias.  Skin: Negative for itching and rash.  Neurological: Positive for tingling and headaches. Negative for tremors, focal weakness and weakness.  Endo/Heme/Allergies: Negative.  Psychiatric/Behavioral: Negative for depression.  All other systems reviewed and are negative.  Otherwise per HPI.  Assessment & Plan: Visit Diagnoses:  1. Paresthesia of skin   2. Cervicalgia   3. Chronic bilateral low back pain with bilateral sciatica   4. Pain in left hip   5. Pain in right hip   6. Chronic pain syndrome   7. Fibromyalgia     Plan: Findings:  Chronic pain syndrome most consistent with fibromyalgia type syndrome with pain relief from the head down to the feet with nondermatomal numbness and  tingling and essentially normal work-up with advanced imaging of the spine as well as blood work for rheumatologic disease.  Exam is nonfocal for any nerve deficits but does show trigger points in the levator scapula and rhomboid regions.  She has underlying myofascial pain as well.  Unfortunately manual type treatment in the past is because of increase in pain.  We do see this with people with myofascial pain but also with fibromyalgia on top of it may just do not tolerate the typical treatment of that.  She clearly from my evaluation does not have any condition that should be considered treatable with chronic opioid medications.  Her primary care physician is leaving the practice.  I will try to get my note to her new practitioner which she believes will be Dr. Oval Linsey once again.  My only recommendation for her which would be with anybody with fibromyalgia would be sleep hygiene as well as trying to stay active with good exercise which could include pool exercise and walking.  She should consider a weight loss program with a BMI 43.9.  We spent over 30 minutes talking about these issues and treatments.  She seems to lack insight into the fact that her pain is not necessarily related to physical findings thus far.  I think she does have an appointment scheduled at some point with a neurologist and they may look at electrodiagnostic studies for possibly other neurologic diseases as a rule out type of indication.  Unfortunately I do not have much else to offer other than she probably should seek cognitive behavioral therapist and  or pain psychology with coping strategies.    Meds & Orders: No orders of the defined types were placed in this encounter.  No orders of the defined types were placed in this encounter.   Follow-up: Return if symptoms worsen or fail to improve.   Procedures: No procedures performed  No notes on file   Clinical History: DG HIP (WITH OR WITHOUT PELVIS) 5+V  BILAT  COMPARISON:  None.  FINDINGS: Pelvic ring is intact. Postsurgical changes are noted in the pelvis. No acute fracture or dislocation is noted. No soft tissue changes are seen.  IMPRESSION: No acute abnormality noted   Electronically Signed   By: Alcide Clever M.D.   On: 12/05/2017 16:48  MRI LUMBAR SPINE WITHOUT CONTRAST  TECHNIQUE: Multiplanar, multisequence MR imaging of the lumbar spine was performed. No intravenous contrast was administered.  COMPARISON: CT abdomen and pelvis 03/20/2012  FINDINGS: Segmentation: Standard.  Alignment: Normal.  Vertebrae: No fracture, suspicious osseous lesion, or significant marrow edema. Subcentimeter L2 vertebral body hemangioma.  Conus medullaris: Extends to the L1 level and appears normal.  Paraspinal and other soft tissues: Unremarkable.  Disc levels:  Disc height and hydration are preserved throughout the lumbar spine. No disc herniation is identified, and the spinal canal and neural foramina are widely patent. There is mild lower lumbar facet arthrosis,  most notable at L4-5 where tiny cysts are noted posteroinferior to the right facet joint, not in a position to cause neural impingement.  IMPRESSION: 1. No disc herniation or evidence of neural impingement. 2. Mild lower lumbar facet arthrosis.   Electronically Signed By: Sebastian Ache M.D. On: 04/17/2017 15:00  MRI CERVICAL SPINE WITHOUT CONTRAST  TECHNIQUE: Multiplanar, multisequence MR imaging of the cervical spine was performed. No intravenous contrast was administered.  COMPARISON: Cervical spine MRI 01/09/2012. Cervical spine radiographs 11/24/2011.  FINDINGS: Chronic straightening and mild reversal of cervical lordosis appear stable. No marrow edema or evidence of acute osseous abnormality.  Partially empty sella. Negative visualized posterior fossa. Cervicomedullary junction is within normal limits. Spinal cord signal is  within normal limits at all visualized levels.  Negative paraspinal soft tissues. 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  C2-C3: Stable mild right facet hypertrophy. No stenosis.  C3-C4: Minimal disc bulge. No stenosis.  C4-C5: Minimal disc bulge. No stenosis.  C5-C6: New very small central disc protrusion best seen on series 6, image 18. Narrowing of the ventral CSF space but no significant spinal stenosis. Mild left uncovertebral hypertrophy. No significant foraminal stenosis.  C6-C7: Negative.  C7-T1: Mild facet hypertrophy. Otherwise negative.  No upper thoracic spinal or foraminal stenosis.  IMPRESSION: Chronic straightening of cervical lordosis which is nonspecific. Very mild cervical spine degeneration, including tiny disc protrusion at C5-C6, but no associated spinal stenosis or convincing neural impingement.   Electronically Signed By: Odessa Fleming M.D. On: 01/27/2016 14:01   She reports that she has been smoking cigarettes.  She has a 8.50 pack-year smoking history. She has never used smokeless tobacco.  Recent Labs    12/05/17 1217 01/28/18 1339  HGBA1C 5.9* 6.4*    Objective:  VS:  HT:4\' 9"  (144.8 cm)   WT:203 lb (92.1 kg)  BMI:43.92    BP:139/79  HR:(!) 113bpm  TEMP:98.5 F (36.9 C)(Oral)  RESP:98 % Physical Exam  Constitutional: She is oriented to person, place, and time. She appears well-developed and well-nourished. No distress.  Obese  HENT:  Head: Normocephalic and atraumatic.  Nose: Nose normal.  Mouth/Throat: Oropharynx is clear and moist.  Eyes: Pupils are equal, round, and reactive to light. Conjunctivae are normal.  Neck: Neck supple. No JVD present. No tracheal deviation present.  Cardiovascular: Regular rhythm and intact distal pulses.  Pulmonary/Chest: Effort normal. No respiratory distress.  Abdominal: Soft. She exhibits no distension. There is no guarding.  Musculoskeletal:  Physical exam is somewhat hard in the  sense that any physical palpation or touching elicits pain really in multiple areas of multiple tender points.  She does have obvious trigger points however in the levator scapula and rhomboid regions bilaterally.  She has painful range of motion really of all joints and just any type of motion gives her parent pain level that causes her to wince.  She has good strength in the upper extremities bilaterally with a negative Hoffman's test bilaterally.  She has good distal strength without deficit and no clonus bilaterally.  She has no pain with hip rotation that is concordant pain although again it does elicit some pain is out of proportion similar to the rest of the findings.  Neurological: She is alert and oriented to person, place, and time. She exhibits normal muscle tone. Coordination normal.  Skin: Skin is warm. No rash noted. No erythema.  Psychiatric: She has a normal mood and affect. Her behavior is normal.  Nursing note and vitals reviewed.   Ortho  Exam Imaging: No results found.  Past Medical/Family/Surgical/Social History: Medications & Allergies reviewed per EMR, new medications updated. Patient Active Problem List   Diagnosis Date Noted  . Fatigue 11/30/2016  . Body aches 11/30/2016  . Vitamin D deficiency 11/24/2015  . Dyslipidemia 11/24/2015  . Carpal tunnel syndrome, bilateral 11/22/2015  . Decreased libido 03/26/2015  . Hot flashes 03/26/2015  . Kidney stones 04/11/2011    Class: Chronic  . Menometrorrhagia 04/11/2011    Class: Chronic  . Dyspareunia (not due to a general medical condition) 04/11/2011    Class: Chronic   Past Medical History:  Diagnosis Date  . Anemia   . Anxiety   . Arthritis   . Carpal tunnel syndrome, bilateral 11/22/2015   Will try wrist splints  . Community acquired pneumonia   . COPD (chronic obstructive pulmonary disease) (HCC)   . Decreased libido 03/26/2015  . Diabetes mellitus without complication (HCC)    prediabetes  . Dyslipidemia  11/24/2015  . Fibromyalgia   . Fibromyalgia   . Hot flashes 03/26/2015  . Hyperlipidemia   . Kidney stones   . Productive cough    smoker  . Renal disorder   . Vitamin D deficiency 11/24/2015   Family History  Problem Relation Age of Onset  . Breast cancer Mother   . Diabetes Father   . Heart failure Father   . Migraines Sister   . Scoliosis Daughter   . ADD / ADHD Son   . Cancer Maternal Grandfather        pancreatic  . Diabetes Paternal Grandmother   . Congestive Heart Failure Paternal Grandmother   . ADD / ADHD Daughter   . Scoliosis Daughter   . Hyperlipidemia Daughter   . Diabetes Daughter        prediabetes  . Bipolar disorder Son   . ADD / ADHD Son    Past Surgical History:  Procedure Laterality Date  . ABDOMINAL HYSTERECTOMY     has ovaries.   Marland Kitchen BREAST CYST EXCISION    . CESAREAN SECTION  2006   [facility]  . LAPAROSCOPIC CHOLECYSTECTOMY  2002   [facility]  . LAPAROSCOPIC HYSTERECTOMY  04/12/2011   Procedure: HYSTERECTOMY TOTAL LAPAROSCOPIC;  Surgeon: Lazaro Arms, MD;  Location: [facility];  Service: Gynecology;  Laterality: N/A;  . THYROIDECTOMY, PARTIAL  2012   [facility] [place]   Social History   Occupational History  . Not on file  Tobacco Use  . Smoking status: Current Every Day Smoker    Packs/day: 0.50    Years: 17.00    Pack years: 8.50    Types: Cigarettes  . Smokeless tobacco: Never Used  Substance and Sexual Activity  . Alcohol use: No  . Drug use: No  . Sexual activity: Not Currently    Birth control/protection: Surgical    Comment: hyst

## 2018-04-11 ENCOUNTER — Ambulatory Visit (INDEPENDENT_AMBULATORY_CARE_PROVIDER_SITE_OTHER): Payer: Medicaid Other | Admitting: Neurology

## 2018-04-11 ENCOUNTER — Encounter: Payer: Self-pay | Admitting: Neurology

## 2018-04-11 VITALS — BP 132/94 | HR 108 | Ht <= 58 in | Wt 205.0 lb

## 2018-04-11 DIAGNOSIS — R52 Pain, unspecified: Secondary | ICD-10-CM | POA: Insufficient documentation

## 2018-04-11 DIAGNOSIS — R531 Weakness: Secondary | ICD-10-CM | POA: Diagnosis not present

## 2018-04-11 MED ORDER — GABAPENTIN 300 MG PO CAPS: 600.0000 mg | ORAL_CAPSULE | Freq: Three times a day (TID) | ORAL | 11 refills | Status: DC

## 2018-04-11 NOTE — Progress Notes (Signed)
PATIENT: Cassandra Lucas DOB: [birth year]  Chief Complaint  Patient presents with  . Diplopia/Blurred vision    She has constant blurred vision and intermitternt diplopia.  She underwent a recent brain MRI that was abnormal that showed empty sella.  She is would like further clarification of these results.  She has a pending ophthalmologist in October.    . Pain    Reports full body pain over the last eight years.  She was evaluated this month by Dr. Magnus Sinning at [facility].  Says she had been on oxycodone for the last eight years for fibromyalgia from her PCP but he is no longer offering this as a treatment option.  Marland Kitchen PCP    Lucia Gaskins, MD     HISTORICAL  Cassandra Lucas is a 42 years old female, seen in request by her primary care doctor Lucia Gaskins for evaluation of diplopia, blurred vision, pain all over her body, initial evaluation was on April 11, 2018.  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, property maintenance, but over the past few weeks, she could barely move about, complains of diffuse body achy pain, stiffness, difficulty walking, raise arm overhead, she also complains of neck, low back pain, intermittent numbness in her hands and shoulders,  She has been treated with Cymbalta since beginning of July 2019, and gabapentin 300 mg 3 times a day, which only helped her symptoms some, in addition, she is taking chronic narcotic prescription, I have reviewed controlled substance registry, she is getting lorazepam 1 mg 30 tablets each month, oxycodone 13m 60 tablets  each month through her primary care doctor  Laboratory evaluation in 2019, A1c was 6.4, normal BMP, with glucose of 167, normal or negative rheumatoid factor, ANA, [id number] 6174 folic acid 108.1 normal CPK, ESR, negative RPR, HIV, TSH, free T4, vitamin D level was 25,  MRI of the brain May 2019 showed partial empty sella otherwise no significant abnormality.   REVIEW OF SYSTEMS: Full 14 system review of systems performed and notable only for blurred vision, fatigue  ALLERGIES: Allergies  Allergen Reactions  . Nsaids Shortness Of Breath  . Aspirin Other (See Comments)    Unknown childhood reaction  . Macrobid [Nitrofurantoin Macrocrystal] Nausea And Vomiting    HOME MEDICATIONS: Current Outpatient Medications  Medication Sig Dispense Refill  . acetaminophen (TYLENOL) 500 MG tablet Take 1,000 mg by mouth daily as needed for mild pain.     .Marland Kitchenalbuterol (PROVENTIL HFA;VENTOLIN HFA) 108 (90 BASE) MCG/ACT inhaler Inhale 2 puffs into the lungs every 6 (six) hours as needed for wheezing or shortness of breath.    .Marland Kitchenalbuterol (PROVENTIL) (2.5 MG/3ML) 0.083% nebulizer solution Take 2.5 mg by nebulization every 6 (six) hours as needed for wheezing or shortness of breath.    . Cholecalciferol (VITAMIN D) 2000 units CAPS Take 1 capsule by mouth daily.    . Cholecalciferol 5000 units capsule Take 1 capsule (5,000 Units total) by mouth daily.    . Cimetidine (HEARTBURN RELIEF PO) Take 1 tablet by mouth daily as needed (heartburn).    . DULoxetine (CYMBALTA) 60 MG  capsule Take 1 capsule (60 mg total) by mouth daily. 30 capsule 0  . gabapentin (NEURONTIN) 300 MG capsule Take 300 mg by mouth 2 (two) times daily.    Marland Kitchen ipratropium (ATROVENT HFA) 17 MCG/ACT inhaler Inhale 1 puff into the lungs 4 (four) times daily.    Marland Kitchen lisinopril (PRINIVIL,ZESTRIL) 10 MG tablet Take 1 tablet (10 mg total) by mouth daily. 90 tablet 3  . metFORMIN (GLUCOPHAGE) 500 MG tablet Take 1 tablet (500 mg total) by mouth 2 (two) times  daily with a meal. 180 tablet 3  . methocarbamol (ROBAXIN) 750 MG tablet Take 750 mg by mouth daily as needed for muscle spasms.    . metroNIDAZOLE (FLAGYL) 500 MG tablet Take 1 tablet (500 mg total) by mouth 2 (two) times daily. 14 tablet 0  . Multiple Vitamin (MULTI-DAY VITAMINS PO) Take 1 tablet by mouth daily.    . Multiple Vitamins-Minerals (MULTIPLE VITAMINS/WOMENS PO) Take 1 tablet by mouth daily.    Marland Kitchen oxyCODONE (OXY IR/ROXICODONE) 5 MG immediate release tablet Take 5 mg by mouth every 6 (six) hours as needed. pain     No current facility-administered medications for this visit.     PAST MEDICAL HISTORY: Past Medical History:  Diagnosis Date  . Anemia   . Anxiety   . Arthritis   . Carpal tunnel syndrome, bilateral 11/22/2015   Will try wrist splints  . Community acquired pneumonia   . COPD (chronic obstructive pulmonary disease) ([place])   . Decreased libido 03/26/2015  . Diabetes mellitus without complication (HCC)    prediabetes  . Dyslipidemia 11/24/2015  . Empty sella ([place])   . Fibromyalgia   . Fibromyalgia   . Hot flashes 03/26/2015  . Hyperlipidemia   . Kidney stones   . Productive cough    smoker  . Renal disorder   . Vitamin D deficiency 11/24/2015    PAST SURGICAL HISTORY: Past Surgical History:  Procedure Laterality Date  . ABDOMINAL HYSTERECTOMY     has ovaries.   Marland Kitchen BREAST CYST EXCISION    . CESAREAN SECTION  2006   [place]  . LAPAROSCOPIC CHOLECYSTECTOMY  2002   [facility]  . LAPAROSCOPIC HYSTERECTOMY  04/12/2011   Procedure: HYSTERECTOMY TOTAL LAPAROSCOPIC;  Surgeon: Florian Buff, MD;  Location: [facility];  Service: Gynecology;  Laterality: N/A;  . THYROIDECTOMY, PARTIAL  2012   [facility], [place]    FAMILY HISTORY: Family History  Problem Relation Age of Onset  . Breast cancer Mother   . Diabetes Father   . Heart failure Father   . Migraines Sister   . Scoliosis Daughter   . ADD / ADHD Son   . Cancer Maternal Grandfather        pancreatic  . Diabetes Paternal  Grandmother   . Congestive Heart Failure Paternal Grandmother   . ADD / ADHD Daughter   . Scoliosis Daughter   . Hyperlipidemia Daughter   . Diabetes Daughter        prediabetes  . Bipolar disorder Son   . ADD / ADHD Son     SOCIAL HISTORY: Social History   Socioeconomic History  . Marital status: Legally Separated    Spouse name: Not on file  . Number of children: 4  . Years of education: 11th grade  . Highest education level: Not on file  Occupational History  . 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 Financial resource strain: Not on file  . Food  insecurity:    Worry: Never true    Inability: Never true  . Transportation needs:    Medical: Yes    Non-medical: Yes  Tobacco Use  . Smoking status: Current Every Day Smoker    Packs/day: 0.50    Years: 17.00    Pack years: 8.50    Types: Cigarettes  . Smokeless tobacco: Never Used  Substance and Sexual Activity  . Alcohol use: No  . Drug use: No  . Sexual activity: Not Currently    Birth control/protection: Surgical    Comment: hyst  Lifestyle  . Physical activity:    Days per week: 3 days    Minutes per session: Not on file  . Stress: Rather much  Relationships  . Social connections:    Talks on phone: Three times a week    Gets together: Never    Attends religious service: More than 4 times per year    Active member of club or organization: No    Attends meetings of clubs or organizations: Never    Relationship status: Separated  . Intimate partner violence:    Fear of current or ex partner: No    Emotionally abused: No    Physically abused: No    Forced sexual activity: No  Other Topics Concern  . Not on file  Social History Narrative   Live with 3 children. Eats all food groups. Wear seatbelt.   Right-handed.   3 cans of soda per day.     PHYSICAL EXAM   Vitals:   04/11/18 1444  BP: (!) 132/94  Pulse: (!) 108  Weight: 205 lb (93 kg)  Height: '4\' 9"'  (1.448  m)    Not recorded      Body mass index is 44.36 kg/m.  PHYSICAL EXAMNIATION:  Gen: NAD, conversant, well nourised, obese, well groomed                     Cardiovascular: Regular rate rhythm, no peripheral edema, warm, nontender. Eyes: Conjunctivae clear without exudates or hemorrhage Neck: Supple, no carotid bruits. Pulmonary: Clear to auscultation bilaterally   NEUROLOGICAL EXAM:  MENTAL STATUS: Speech:    Speech is normal; fluent and spontaneous with normal comprehension.  Cognition:     Orientation to time, place and person     Normal recent and remote memory     Normal Attention span and concentration     Normal Language, naming, repeating,spontaneous speech     Fund of knowledge   CRANIAL NERVES: CN II: Visual fields are full to confrontation. Fundoscopic exam is normal with sharp discs and no vascular changes. Pupils are round equal and briskly reactive to light. CN III, IV, VI: extraocular movement are normal. No ptosis. CN V: Facial sensation is intact to pinprick in all 3 divisions bilaterally. Corneal responses are intact.  CN VII: Face is symmetric with normal eye closure and smile. CN VIII: Hearing is normal to rubbing fingers CN IX, X: Palate elevates symmetrically. Phonation is normal. CN XI: Head turning and shoulder shrug are intact CN XII: Tongue is midline with normal movements and no atrophy.  MOTOR: There is no pronator drift of out-stretched arms. Muscle bulk and tone are normal. Muscle strength is normal.  REFLEXES: Reflexes are 2+ and symmetric at the biceps, triceps, knees, and ankles. Plantar responses are flexor.  SENSORY: Intact to light touch, pinprick, positional sensation and vibratory sensation are intact in fingers and toes.  COORDINATION: Rapid alternating movements and fine  finger movements are intact. There is no dysmetria on finger-to-nose and heel-knee-shin.    GAIT/STANCE:   Deliberate effort due to diffuse body achy  pain  DIAGNOSTIC DATA (LABS, IMAGING, TESTING) - I reviewed patient records, labs, notes, testing and imaging myself where available.   ASSESSMENT AND PLAN  TALICIA SUI is a 42 y.o. female    Intermittent diffuse body achy pain  Extensive laboratory evaluation failed to demonstrate etiology  Her functional limitation is mainly due to pain,  Increase gabapentin to 300 mg 2 tablets 3 times a day  EMG nerve conduction study to rule out peripheral etiology,   Marcial Pacas, M.D. Ph.D.  [facility] [address] [place], [place] [address] Ph: [phone] Fax: [phone]  CC:  Lucia Gaskins, MD

## 2018-04-12 NOTE — Telephone Encounter (Signed)
Called to discuss Dr.Haglers response to patient's questions. No answer, vm has not been set up

## 2018-04-12 NOTE — Telephone Encounter (Signed)
Spoke with patient and advised her of Dr.Hagler's recommendation with verbal understanding.

## 2018-05-02 IMAGING — DX DG HIP (WITH OR WITHOUT PELVIS) 5+V BILAT
5 series · 5 of 5 positions shown · non-contrast
Comparison: None.

CLINICAL DATA: Bilateral hip pain, initial encounter

EXAM:
DG HIP (WITH OR WITHOUT PELVIS) 5+V BILAT

[pelvis ap]
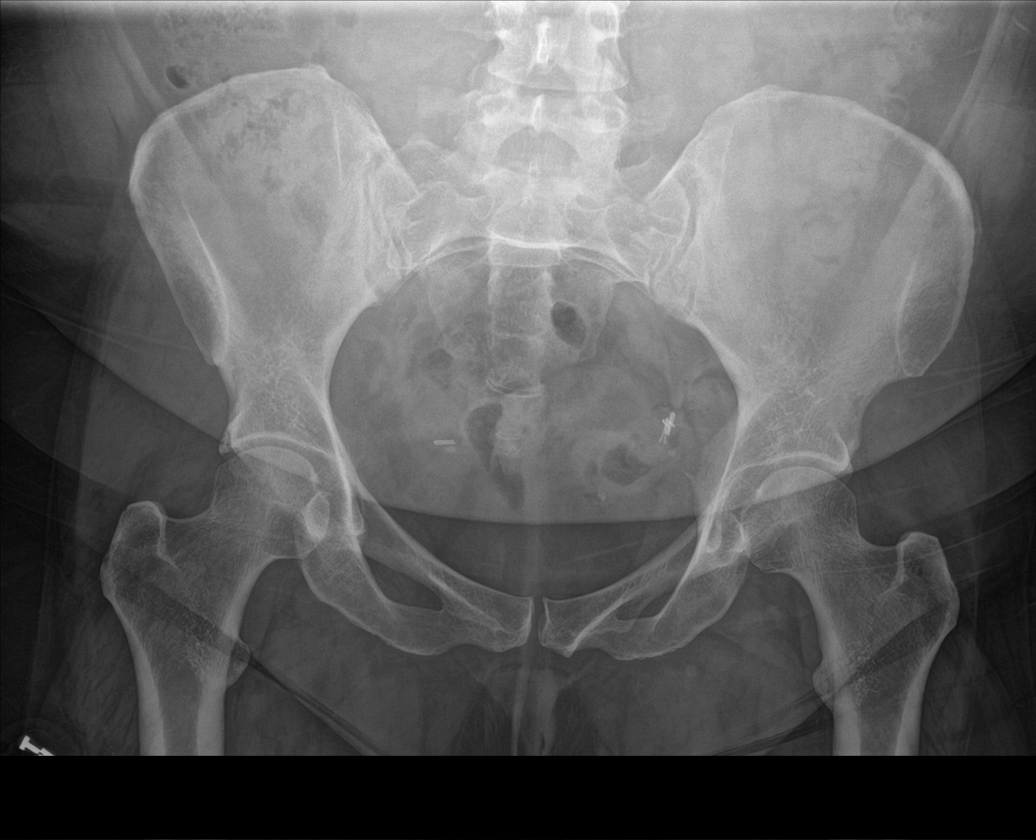

[hip ap (1 of 2)]
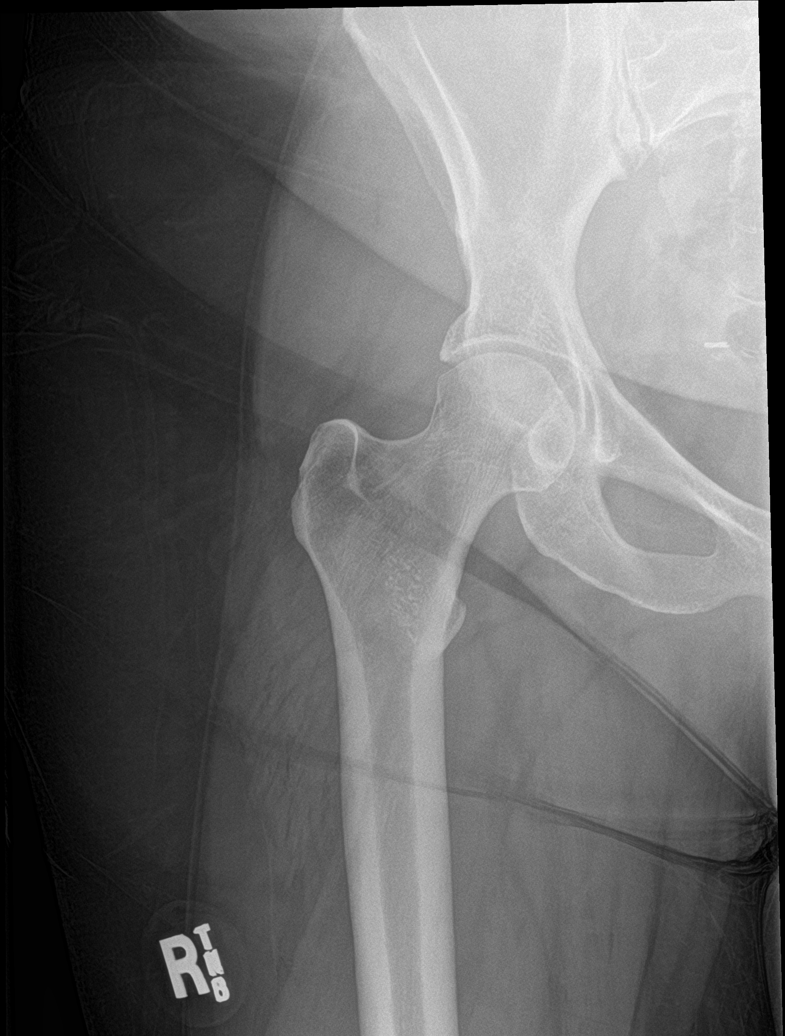

[hip lat (1 of 2)]
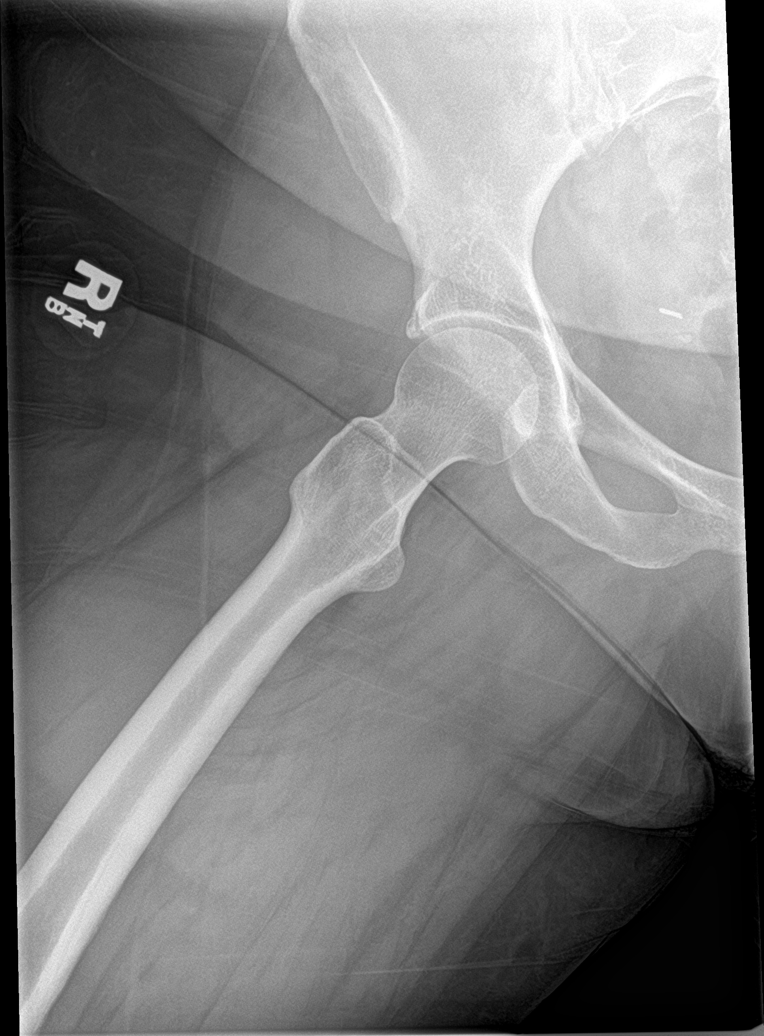

[hip ap (2 of 2)]
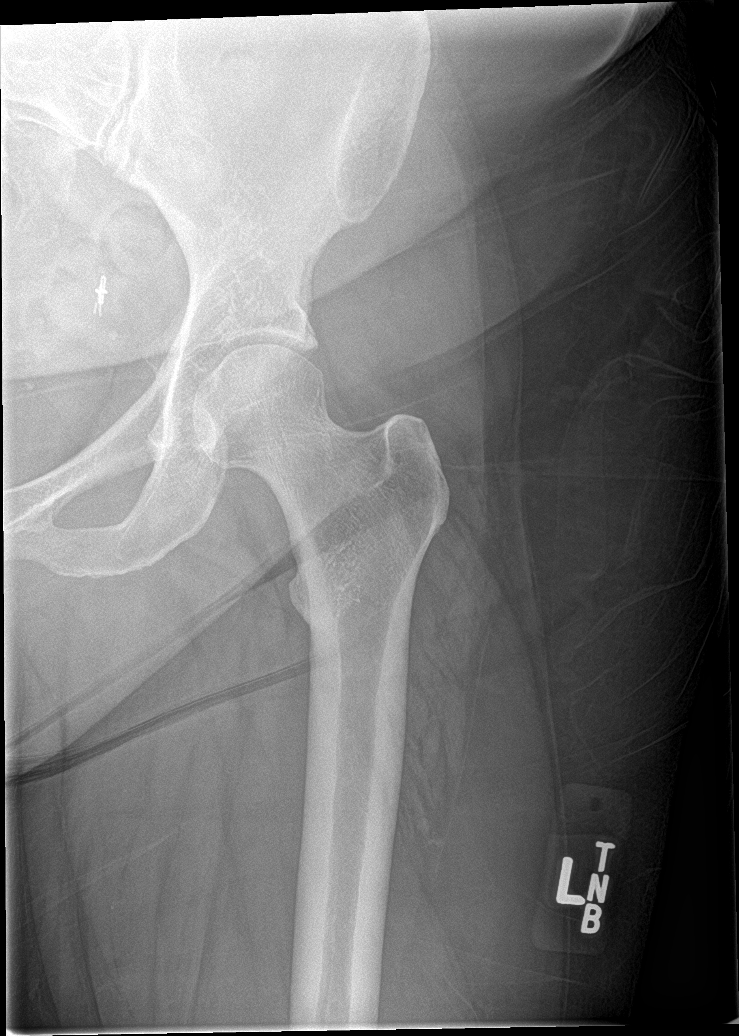

[hip lat (2 of 2)]
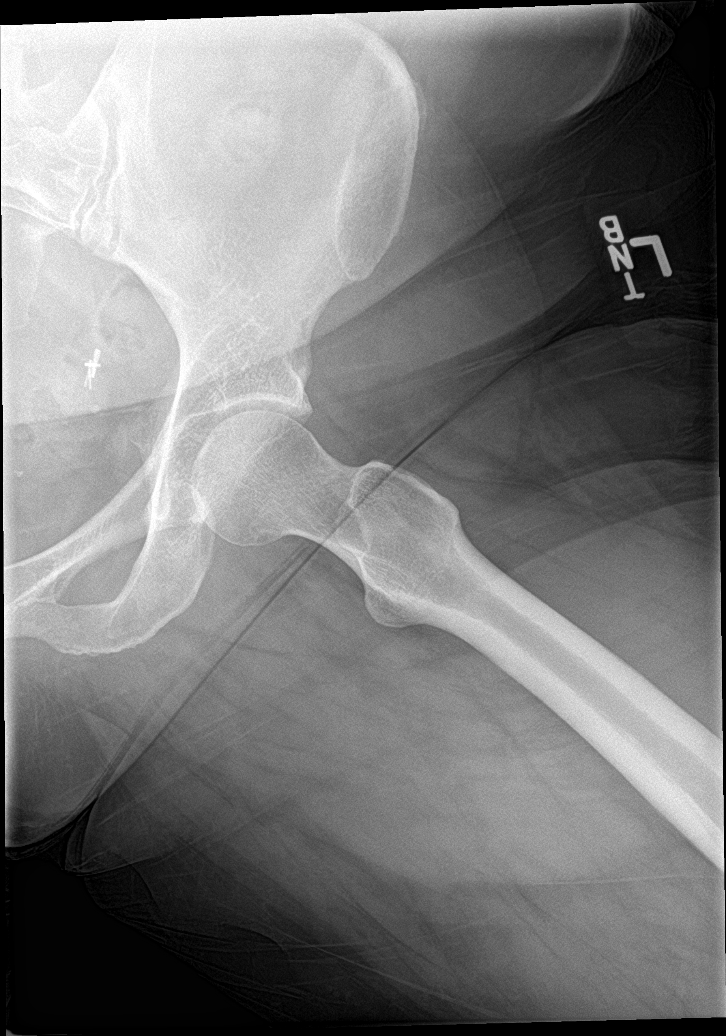

[5 of 5 positions shown; findings below may reference images not displayed]

FINDINGS: Pelvic ring is intact. Postsurgical changes are noted in the pelvis.
No acute fracture or dislocation is noted. No soft tissue changes
are seen.
IMPRESSION: No acute abnormality noted

## 2018-05-17 ENCOUNTER — Encounter (INDEPENDENT_AMBULATORY_CARE_PROVIDER_SITE_OTHER): Payer: Medicaid Other | Admitting: Neurology

## 2018-05-17 ENCOUNTER — Ambulatory Visit (INDEPENDENT_AMBULATORY_CARE_PROVIDER_SITE_OTHER): Payer: Medicaid Other | Admitting: Neurology

## 2018-05-17 DIAGNOSIS — Z0289 Encounter for other administrative examinations: Secondary | ICD-10-CM

## 2018-05-17 DIAGNOSIS — R52 Pain, unspecified: Secondary | ICD-10-CM

## 2018-05-17 DIAGNOSIS — R531 Weakness: Secondary | ICD-10-CM

## 2018-05-17 NOTE — Procedures (Signed)
Full Name: Cassandra Lucas Gender: Female MRN #: [id number] Date of Birth: 1976/02/17    Visit Date: 05/17/2018 11:41 Age: 42 Years 7 Months Old Examining Physician: Levert Feinstein, MD  Referring Physician: Terrace Arabia, MD History: 42 years old female, complains of diffuse body achy pain, bilateral hands paresthesia  Summary of the tests:  Nerve conduction study: Bilateral median sensory responses showed prolonged peak latency, decreased to snap amplitude, right worse than left, bilateral median motor responses showed prolonged distal latency, right side is moderate, left side is mild, bilateral median map amplitude was moderately decreased; conduction velocities were normal.  Right radial sensory, right ulnar sensory and motor responses were normal.  Left sural, superficial peroneal sensory responses were normal.  Left peroneal to EDB, tibial motor responses were normal.  Electromyography: Selective needle examination was performed at right upper extremity muscles, left lower extremity muscles. The only abnormality is noted at the right abductor pollicis brevis, with mildly decreased recruitment patterns.  Conclusion: This is an abnormal study.  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  There is no evidence of peripheral neuropathy, no myelopathic changes to explain her diffuse body achy pain.    ------------------------------- Levert Feinstein, M.D. PhD.  [facility] [address] [place], [place] [address] Tel: [phone] Fax: [phone]        [facility]    Nerve / Sites Muscle Latency Ref. Amplitude Ref. Rel Amp Segments Distance Velocity Ref. Area    ms ms mV mV %  cm m/s m/s mVms  R Median - APB     Wrist APB 5.8 ?4.4 2.2 ?4.0 100 Wrist - APB 7   7.9     Upper arm APB 8.6  2.2  98 Upper arm - Wrist 15 53 ?49 7.1  L Median - APB       Wrist APB 4.7 ?4.4 2.0 ?4.0 100 Wrist - APB 7   7.4     Upper arm APB 7.9  1.9  97.8 Upper arm - Wrist 16 50 ?49 7.6  R Ulnar - ADM     Wrist ADM 3.0 ?3.3 8.4 ?6.0 100 Wrist - ADM 7   20.5     B.Elbow ADM 5.4  8.1  97.1 B.Elbow - Wrist 15 64 ?49 20.6     A.Elbow ADM 6.6  7.1  87.8 A.Elbow - B.Elbow 8 64 ?49 18.9         A.Elbow - Wrist      L Peroneal - EDB     Ankle EDB 4.9 ?6.5 3.9 ?2.0 100 Ankle - EDB 9   18.1     Fib head EDB 9.1  3.1  78.7 Fib head - Ankle 23 55 ?44 18.3     Pop fossa EDB 10.6  3.9  127 Pop fossa - Fib head 8 55 ?44 17.4         Pop fossa - Ankle      L Tibial - AH     Ankle AH 3.4 ?5.8 13.5 ?4.0 100 Ankle - AH 9   36.7     Pop fossa AH 9.3  9.7  72 Pop fossa - Ankle 26 45 ?41 33.2               SNC    Nerve / Sites Rec. Site Peak Lat Ref.  Amp Ref. Segments Distance    ms  ms V V  cm  R Radial - Anatomical snuff box (Forearm)     Forearm Wrist 2.1 ?2.9 18 ?15 Forearm - Wrist 10  L Sural - Ankle (Calf)     Calf Ankle 3.0 ?4.4 22 ?6 Calf - Ankle 14  L Superficial peroneal - Ankle     Lat leg Ankle 3.0 ?4.4 8 ?6 Lat leg - Ankle 14  R Median - Orthodromic (Dig II, Mid palm)     Dig II Wrist 4.5 ?3.4 3 ?10 Dig II - Wrist 13  L Median - Orthodromic (Dig II, Mid palm)     Dig II Wrist 3.9 ?3.4 9 ?10 Dig II - Wrist 13  R Ulnar - Orthodromic, (Dig V, Mid palm)     Dig V Wrist 2.2 ?3.1 5 ?5 Dig V - Wrist 75                 F  Wave    Nerve F Lat Ref.   ms ms  R Ulnar - ADM 21.0 ?32.0       EMG full       EMG Summary Table    Spontaneous MUAP Recruitment  Muscle IA Fib PSW Fasc Other Amp Dur. Poly Pattern  R. First dorsal interosseous Normal None None None _______ Normal Normal Normal Normal  R. Brachioradialis Normal None None None _______ Normal Normal Normal Normal  R. Pronator teres Normal None None None _______ Normal Normal Normal Normal  R. Extensor digitorum communis Normal None None None _______ Normal Normal Normal Normal  R. Biceps brachii  Normal None None None _______ Normal Normal Normal Normal  R. Deltoid Normal None None None _______ Normal Normal Normal Normal  R. Triceps brachii Normal None None None _______ Normal Normal Normal Normal  L. Tibialis anterior Normal None None None _______ Normal Normal Normal Normal  L. Tibialis posterior Normal None None None _______ Normal Normal Normal Normal  L. Gastrocnemius (Medial head) Normal None None None _______ Normal Normal Normal Normal  L. Peroneus longus Normal None None None _______ Normal Normal Normal Normal  L. Vastus lateralis Normal None None None _______ Normal Normal Normal Normal  L. Biceps femoris (long head) Normal None None None _______ Normal Normal Normal Normal  R. Abductor pollicis brevis Normal None None None _______ Normal Normal Normal Reduced

## 2019-03-17 ENCOUNTER — Other Ambulatory Visit: Payer: Self-pay | Admitting: Internal Medicine

## 2019-03-17 ENCOUNTER — Other Ambulatory Visit: Payer: Medicaid Other

## 2019-03-17 DIAGNOSIS — Z20822 Contact with and (suspected) exposure to covid-19: Secondary | ICD-10-CM

## 2019-03-22 LAB — NOVEL CORONAVIRUS, NAA: SARS-CoV-2, NAA: NOT DETECTED

## 2019-10-21 ENCOUNTER — Other Ambulatory Visit: Payer: Self-pay | Admitting: Nurse Practitioner

## 2019-10-21 DIAGNOSIS — M542 Cervicalgia: Secondary | ICD-10-CM

## 2020-05-31 ENCOUNTER — Encounter: Payer: Self-pay | Admitting: Oncology

## 2020-05-31 ENCOUNTER — Telehealth: Payer: Self-pay | Admitting: Oncology

## 2020-05-31 ENCOUNTER — Encounter: Payer: Self-pay | Admitting: Emergency Medicine

## 2020-05-31 ENCOUNTER — Other Ambulatory Visit: Payer: Self-pay

## 2020-05-31 ENCOUNTER — Ambulatory Visit
Admission: EM | Admit: 2020-05-31 | Discharge: 2020-05-31 | Disposition: A | Discharge status: Home / Self Care | Payer: Medicaid Other

## 2020-05-31 DIAGNOSIS — R062 Wheezing: Secondary | ICD-10-CM

## 2020-05-31 DIAGNOSIS — R05 Cough: Secondary | ICD-10-CM

## 2020-05-31 DIAGNOSIS — J069 Acute upper respiratory infection, unspecified: Secondary | ICD-10-CM

## 2020-05-31 DIAGNOSIS — Z20822 Contact with and (suspected) exposure to covid-19: Secondary | ICD-10-CM

## 2020-05-31 DIAGNOSIS — R059 Cough, unspecified: Secondary | ICD-10-CM

## 2020-05-31 MED ORDER — BENZONATATE 100 MG PO CAPS: 100.0000 mg | ORAL_CAPSULE | Freq: Three times a day (TID) | ORAL | 0 refills | Status: DC

## 2020-05-31 MED ORDER — PREDNISONE 10 MG (21) PO TBPK: ORAL_TABLET | Freq: Every day | ORAL | 0 refills | Status: DC

## 2020-05-31 MED ORDER — DEXAMETHASONE SODIUM PHOSPHATE 10 MG/ML IJ SOLN
10.0000 mg | Freq: Once | INTRAMUSCULAR | Status: AC
  Administered 2020-05-31: 10 mg via INTRAMUSCULAR

## 2020-05-31 NOTE — ED Triage Notes (Signed)
Cough, headache, body aches, fatigue x 3 days

## 2020-05-31 NOTE — Telephone Encounter (Signed)
Called to Discuss with patient about Covid symptoms and the use of regeneron, a monoclonal antibody infusion for those with mild to moderate Covid symptoms and at a high risk of hospitalization.     Pt is qualified for this infusion at the [facility] infusion center due to co-morbid conditions and/or a member of an at-risk group.     Unable to reach pt. No VM available. Sent [employer] and text message.   Mignon Pine, AGNP-C [phone] (Infusion Center Hotline)

## 2020-05-31 NOTE — Discharge Instructions (Signed)

## 2020-05-31 NOTE — ED Provider Notes (Signed)
[facility]   [id number] 05/31/20 Arrival Time: 1038   CC: COVID symptoms  SUBJECTIVE: History from: patient.  Cassandra Lucas is a 44 y.o. female who presents with productive cough, wheezing, runny nose, congestion, sore throat, headache, body aches, and fatigue x 3 days.  Denies sick exposure to COVID, flu or strep.  Denies recent travel.  Has tried OTC medications without relief.  Denies aggravating factors.  Denies previous COVID infection in the past.  Has not received COVID vaccine.   Denies SOB, chest pain, nausea, changes in bowel or bladder habits.    ROS: As per HPI.  All other pertinent ROS negative.     Past Medical History:  Diagnosis Date  . Anemia   . Anxiety   . Arthritis   . Carpal tunnel syndrome, bilateral 11/22/2015   Will try wrist splints  . Community acquired pneumonia   . COPD (chronic obstructive pulmonary disease) (HCC)   . Decreased libido 03/26/2015  . Diabetes mellitus without complication (HCC)    prediabetes  . Dyslipidemia 11/24/2015  . Empty sella (HCC)   . Fibromyalgia   . Fibromyalgia   . Hot flashes 03/26/2015  . Hyperlipidemia   . Kidney stones   . Productive cough    smoker  . Renal disorder   . Vitamin D deficiency 11/24/2015   Past Surgical History:  Procedure Laterality Date  . ABDOMINAL HYSTERECTOMY     has ovaries.   Marland Kitchen BREAST CYST EXCISION    . CESAREAN SECTION  2006   [facility]  . LAPAROSCOPIC CHOLECYSTECTOMY  2002   [facility]  . LAPAROSCOPIC HYSTERECTOMY  04/12/2011   Procedure: HYSTERECTOMY TOTAL LAPAROSCOPIC;  Surgeon: Lazaro Arms, MD;  Location: [facility];  Service: Gynecology;  Laterality: N/A;  . THYROIDECTOMY, PARTIAL  2012   [facility], [facility]   Allergies  Allergen Reactions  . Nsaids Shortness Of Breath  . Aspirin Other (See Comments)    Unknown childhood reaction  . Macrobid [Nitrofurantoin Macrocrystal] Nausea And Vomiting   No current facility-administered medications on file prior to encounter.   Current  Outpatient Medications on File Prior to Encounter  Medication Sig Dispense Refill  . acetaminophen (TYLENOL) 500 MG tablet Take 1,000 mg by mouth daily as needed for mild pain.     Marland Kitchen albuterol (PROVENTIL HFA;VENTOLIN HFA) 108 (90 BASE) MCG/ACT inhaler Inhale 2 puffs into the lungs every 6 (six) hours as needed for wheezing or shortness of breath.    Marland Kitchen albuterol (PROVENTIL) (2.5 MG/3ML) 0.083% nebulizer solution Take 2.5 mg by nebulization every 6 (six) hours as needed for wheezing or shortness of breath.    . Cholecalciferol (VITAMIN D) 2000 units CAPS Take 1 capsule by mouth daily.    . Cholecalciferol 5000 units capsule Take 1 capsule (5,000 Units total) by mouth daily.    . DULoxetine (CYMBALTA) 60 MG capsule Take 1 capsule (60 mg total) by mouth daily. 30 capsule 0  . gabapentin (NEURONTIN) 300 MG capsule Take 2 capsules (600 mg total) by mouth 3 (three) times daily. 180 capsule 11  . ipratropium (ATROVENT HFA) 17 MCG/ACT inhaler Inhale 1 puff into the lungs 4 (four) times daily.    Marland Kitchen lisinopril (PRINIVIL,ZESTRIL) 10 MG tablet Take 1 tablet (10 mg total) by mouth daily. 90 tablet 3  . methotrexate (50 MG/ML) 1 g injection Inject into the vein once a week.    . Multiple Vitamin (MULTI-DAY VITAMINS PO) Take 1 tablet by mouth daily.    . Multiple Vitamins-Minerals (  MULTIPLE VITAMINS/WOMENS PO) Take 1 tablet by mouth daily.    Marland Kitchen oxyCODONE (OXY IR/ROXICODONE) 5 MG immediate release tablet Take 5 mg by mouth every 6 (six) hours as needed. pain    . pantoprazole (PROTONIX) 20 MG tablet Take 20 mg by mouth daily.    . metFORMIN (GLUCOPHAGE) 500 MG tablet Take 1 tablet (500 mg total) by mouth 2 (two) times daily with a meal. 180 tablet 3  . [DISCONTINUED] Cimetidine (HEARTBURN RELIEF PO) Take 1 tablet by mouth daily as needed (heartburn).     Social History   Socioeconomic History  . Marital status: Legally Separated    Spouse name: Not on file  . Number of children: 4  . Years of education: 11th  grade  . Highest education level: Not on file  Occupational History  . 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 Smoking status: Current Every Day Smoker    Packs/day: 0.50    Years: 17.00    Pack years: 8.50    Types: Cigarettes  . Smokeless tobacco: Never Used  Vaping Use  . Vaping Use: Former  Substance and Sexual Activity  . Alcohol use: No  . Drug use: No  . Sexual activity: Not Currently    Birth control/protection: Surgical    Comment: hyst  Other Topics Concern  . Not on file  Social History Narrative   Live with 3 children. Eats all food groups. Wear seatbelt.   Right-handed.   3 cans of soda per day.   Social Determinants of Health   Financial Resource Strain:   . Difficulty of Paying Living Expenses: Not on file  Food Insecurity:   . Worried About Programme researcher, broadcasting/film/video in the Last Year: Not on file  . Ran Out of Food in the Last Year: Not on file  Transportation Needs:   . Lack of Transportation (Medical): Not on file  . Lack of Transportation (Non-Medical): Not on file  Physical Activity:   . Days of Exercise per Week: Not on file  . Minutes of Exercise per Session: Not on file  Stress:   . Feeling of Stress : Not on file  Social Connections:   . Frequency of Communication with Friends and Family: Not on file  . Frequency of Social Gatherings with Friends and Family: Not on file  . Attends Religious Services: Not on file  . Active Member of Clubs or Organizations: Not on file  . Attends Banker Meetings: Not on file  . Marital Status: Not on file  Intimate Partner Violence:   . Fear of Current or Ex-Partner: Not on file  . Emotionally Abused: Not on file  . Physically Abused: Not on file  . Sexually Abused: Not on file   Family History  Problem Relation Age of Onset  . Breast cancer Mother   . Diabetes Father   . Heart failure Father   . Migraines Sister   . Scoliosis Daughter   . ADD / ADHD Son   .  Cancer Maternal Grandfather        pancreatic  . Diabetes Paternal Grandmother   . Congestive Heart Failure Paternal Grandmother   . ADD / ADHD Daughter   . Scoliosis Daughter   . Hyperlipidemia Daughter   . Diabetes Daughter        prediabetes  . Bipolar disorder Son   . ADD / ADHD Son     OBJECTIVE:  Vitals:  05/31/20 1127 05/31/20 1128  BP: 129/89   Pulse: 100   Resp: 19   Temp: 98.3 F (36.8 C)   TempSrc: Oral   SpO2: 96%   Weight:  205 lb (93 kg)  Height:  4\' 9"  (1.448 m)     General appearance: alert; appears fatigued, but nontoxic; speaking in full sentences and tolerating own secretions HEENT: NCAT; Ears: EACs clear, TMs pearly gray; Eyes: PERRL.  EOM grossly intact.  Nose: nares patent without rhinorrhea, Throat: oropharynx clear, tonsils non erythematous or enlarged, uvula midline  Neck: supple without LAD Lungs: unlabored respirations, symmetrical air entry; cough: mild; no respiratory distress; rhonchi and wheezes throughout bilateral lung fields Heart: regular rate and rhythm.   Skin: warm and dry Psychological: alert and cooperative; normal mood and affect  ASSESSMENT & PLAN:  1. Cough   2. Viral URI with cough   3. Suspected COVID-19 virus infection   4. Wheezing     Meds ordered this encounter  Medications  . benzonatate (TESSALON) 100 MG capsule    Sig: Take 1 capsule (100 mg total) by mouth every 8 (eight) hours.    Dispense:  21 capsule    Refill:  0    Order Specific Question:   Supervising Provider    Answer:   Eustace Moore [[id number]]  . predniSONE (STERAPRED UNI-PAK 21 TAB) 10 MG (21) TBPK tablet    Sig: Take by mouth daily. Take 6 tabs by mouth daily  for 2 days, then 5 tabs for 2 days, then 4 tabs for 2 days, then 3 tabs for 2 days, 2 tabs for 2 days, then 1 tab by mouth daily for 2 days    Dispense:  42 tablet    Refill:  0    Order Specific Question:   Supervising Provider    Answer:   Eustace Moore [[id number]]  .  dexamethasone (DECADRON) injection 10 mg   Steroid shot given in office Prednisone prescribed COVID testing ordered.  It will take between 5-7 days for test results.  Someone will contact you regarding abnormal results.    In the meantime: You should remain isolated in your home for 10 days from symptom onset AND greater than 72 hours after symptoms resolution (absence of fever without the use of fever-reducing medication and improvement in respiratory symptoms), whichever is longer Get plenty of rest and push fluids Tessalon Perles prescribed for cough Use OTC zyrtec for nasal congestion, runny nose, and/or sore throat Use OTC flonase for nasal congestion and runny nose Use medications daily for symptom relief Use OTC medications like ibuprofen or tylenol as needed fever or pain Call or go to the ED if you have any new or worsening symptoms such as fever, worsening cough, shortness of breath, chest tightness, chest pain, turning blue, changes in mental status, etc...   Reviewed expectations re: course of current medical issues. Questions answered. Outlined signs and symptoms indicating need for more acute intervention. Patient verbalized understanding. After Visit Summary given.         Rennis Harding, PA-C 05/31/20 1213

## 2020-06-02 LAB — SARS-COV-2, NAA 2 DAY TAT

## 2020-06-02 LAB — NOVEL CORONAVIRUS, NAA: SARS-CoV-2, NAA: NOT DETECTED

## 2020-06-23 DIAGNOSIS — M797 Fibromyalgia: Secondary | ICD-10-CM | POA: Insufficient documentation

## 2020-06-23 DIAGNOSIS — Z79899 Other long term (current) drug therapy: Secondary | ICD-10-CM | POA: Insufficient documentation

## 2020-06-23 DIAGNOSIS — M0579 Rheumatoid arthritis with rheumatoid factor of multiple sites without organ or systems involvement: Secondary | ICD-10-CM | POA: Insufficient documentation

## 2020-12-25 ENCOUNTER — Encounter: Payer: Self-pay | Admitting: Emergency Medicine

## 2020-12-25 ENCOUNTER — Ambulatory Visit (INDEPENDENT_AMBULATORY_CARE_PROVIDER_SITE_OTHER): Payer: Medicaid Other

## 2020-12-25 ENCOUNTER — Ambulatory Visit
Admission: EM | Admit: 2020-12-25 | Discharge: 2020-12-25 | Disposition: A | Discharge status: Home / Self Care | Payer: Medicaid Other | Attending: Emergency Medicine | Admitting: Emergency Medicine

## 2020-12-25 DIAGNOSIS — R0781 Pleurodynia: Secondary | ICD-10-CM | POA: Diagnosis not present

## 2020-12-25 DIAGNOSIS — Z1152 Encounter for screening for COVID-19: Secondary | ICD-10-CM

## 2020-12-25 DIAGNOSIS — J069 Acute upper respiratory infection, unspecified: Secondary | ICD-10-CM

## 2020-12-25 DIAGNOSIS — R059 Cough, unspecified: Secondary | ICD-10-CM

## 2020-12-25 DIAGNOSIS — J029 Acute pharyngitis, unspecified: Secondary | ICD-10-CM | POA: Diagnosis not present

## 2020-12-25 MED ORDER — PREDNISONE 10 MG (21) PO TBPK: ORAL_TABLET | Freq: Every day | ORAL | 0 refills | Status: DC

## 2020-12-25 MED ORDER — DOXYCYCLINE HYCLATE 100 MG PO CAPS: 100.0000 mg | ORAL_CAPSULE | Freq: Two times a day (BID) | ORAL | 0 refills | Status: DC

## 2020-12-25 MED ORDER — BENZONATATE 100 MG PO CAPS: 100.0000 mg | ORAL_CAPSULE | Freq: Three times a day (TID) | ORAL | 0 refills | Status: DC

## 2020-12-25 NOTE — ED Triage Notes (Signed)
Cough, chest congestion and sore throat.  S/s started x 1 week ago.  Pain to LT side of back with deep breaths

## 2020-12-25 NOTE — ED Provider Notes (Addendum)
[facility]   [id number] 12/25/20 Arrival Time: 1412   Chief Complaint  Patient presents with  . Cough     SUBJECTIVE: History from: patient.  Cassandra Lucas is a 45 y.o. female who presents to the urgent care for complaint of cough with dark green sputum, nasal congestion and sore throat for the past 1 week.  Has developed pain to left posterior ribs with deep breath for the past few days.  Denies sick exposure to COVID, flu or strep.  Denies recent travel.  Has tried OTC medication without relief.  Denies alleviating or aggravating factors.  Denies previous symptoms in the past.   Denies fever, chills, fatigue, sinus pain, rhinorrhea, sore throat, SOB, wheezing, chest pain, nausea, changes in bowel or bladder habits.    ROS: As per HPI.  All other pertinent ROS negative.     Past Medical History:  Diagnosis Date  . Anemia   . Anxiety   . Arthritis   . Carpal tunnel syndrome, bilateral 11/22/2015   Will try wrist splints  . Community acquired pneumonia   . COPD (chronic obstructive pulmonary disease) (HCC)   . Decreased libido 03/26/2015  . Diabetes mellitus without complication (HCC)    prediabetes  . Dyslipidemia 11/24/2015  . Empty sella (HCC)   . Fibromyalgia   . Fibromyalgia   . Hot flashes 03/26/2015  . Hyperlipidemia   . Kidney stones   . Productive cough    smoker  . Renal disorder   . Vitamin D deficiency 11/24/2015   Past Surgical History:  Procedure Laterality Date  . ABDOMINAL HYSTERECTOMY     has ovaries.   Marland Kitchen BREAST CYST EXCISION    . CESAREAN SECTION  2006   [facility]  . LAPAROSCOPIC CHOLECYSTECTOMY  2002   [facility]  . LAPAROSCOPIC HYSTERECTOMY  04/12/2011   Procedure: HYSTERECTOMY TOTAL LAPAROSCOPIC;  Surgeon: Lazaro Arms, MD;  Location: [facility];  Service: Gynecology;  Laterality: N/A;  . THYROIDECTOMY, PARTIAL  2012   [facility], [facility]   Allergies  Allergen Reactions  . Nsaids Shortness Of Breath  . Aspirin Other (See Comments)    Unknown childhood  reaction  . Macrobid [Nitrofurantoin Macrocrystal] Nausea And Vomiting   No current facility-administered medications on file prior to encounter.   Current Outpatient Medications on File Prior to Encounter  Medication Sig Dispense Refill  . acetaminophen (TYLENOL) 500 MG tablet Take 1,000 mg by mouth daily as needed for mild pain.     Marland Kitchen albuterol (PROVENTIL HFA;VENTOLIN HFA) 108 (90 BASE) MCG/ACT inhaler Inhale 2 puffs into the lungs every 6 (six) hours as needed for wheezing or shortness of breath.    Marland Kitchen albuterol (PROVENTIL) (2.5 MG/3ML) 0.083% nebulizer solution Take 2.5 mg by nebulization every 6 (six) hours as needed for wheezing or shortness of breath.    . Cholecalciferol (VITAMIN D) 2000 units CAPS Take 1 capsule by mouth daily.    . Cholecalciferol 5000 units capsule Take 1 capsule (5,000 Units total) by mouth daily.    . DULoxetine (CYMBALTA) 60 MG capsule Take 1 capsule (60 mg total) by mouth daily. 30 capsule 0  . gabapentin (NEURONTIN) 300 MG capsule Take 2 capsules (600 mg total) by mouth 3 (three) times daily. 180 capsule 11  . ipratropium (ATROVENT HFA) 17 MCG/ACT inhaler Inhale 1 puff into the lungs 4 (four) times daily.    Marland Kitchen lisinopril (PRINIVIL,ZESTRIL) 10 MG tablet Take 1 tablet (10 mg total) by mouth daily. 90 tablet 3  .  metFORMIN (GLUCOPHAGE) 500 MG tablet Take 1 tablet (500 mg total) by mouth 2 (two) times daily with a meal. 180 tablet 3  . methotrexate (50 MG/ML) 1 g injection Inject into the vein once a week.    . Multiple Vitamin (MULTI-DAY VITAMINS PO) Take 1 tablet by mouth daily.    . Multiple Vitamins-Minerals (MULTIPLE VITAMINS/WOMENS PO) Take 1 tablet by mouth daily.    Marland Kitchen oxyCODONE (OXY IR/ROXICODONE) 5 MG immediate release tablet Take 5 mg by mouth every 6 (six) hours as needed. pain    . pantoprazole (PROTONIX) 20 MG tablet Take 20 mg by mouth daily.    . [DISCONTINUED] Cimetidine (HEARTBURN RELIEF PO) Take 1 tablet by mouth daily as needed (heartburn).      Social History   Socioeconomic History  . Marital status: Legally Separated    Spouse name: Not on file  . Number of children: 4  . Years of education: 11th grade  . Highest education level: Not on file  Occupational History  . 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 Smoking status: Current Every Day Smoker    Packs/day: 0.50    Years: 17.00    Pack years: 8.50    Types: Cigarettes  . Smokeless tobacco: Never Used  Vaping Use  . Vaping Use: Former  Substance and Sexual Activity  . Alcohol use: No  . Drug use: No  . Sexual activity: Not Currently    Birth control/protection: Surgical    Comment: hyst  Other Topics Concern  . Not on file  Social History Narrative   Live with 3 children. Eats all food groups. Wear seatbelt.   Right-handed.   3 cans of soda per day.   Social Determinants of Health   Financial Resource Strain: Not on file  Food Insecurity: Not on file  Transportation Needs: Not on file  Physical Activity: Not on file  Stress: Not on file  Social Connections: Not on file  Intimate Partner Violence: Not on file   Family History  Problem Relation Age of Onset  . Breast cancer Mother   . Diabetes Father   . Heart failure Father   . Migraines Sister   . Scoliosis Daughter   . ADD / ADHD Son   . Cancer Maternal Grandfather        pancreatic  . Diabetes Paternal Grandmother   . Congestive Heart Failure Paternal Grandmother   . ADD / ADHD Daughter   . Scoliosis Daughter   . Hyperlipidemia Daughter   . Diabetes Daughter        prediabetes  . Bipolar disorder Son   . ADD / ADHD Son     OBJECTIVE:  Vitals:   12/25/20 1425  BP: 117/82  Pulse: (!) 117  Resp: 19  Temp: 97.8 F (36.6 C)  TempSrc: Oral  SpO2: 99%     General appearance: alert; appears fatigued, but nontoxic; speaking in full sentences and tolerating own secretions HEENT: NCAT; Ears: EACs clear, TMs pearly gray; Eyes: PERRL.  EOM grossly  intact. Sinuses: nontender; Nose: nares patent without rhinorrhea, Throat: oropharynx clear, tonsils non erythematous or enlarged, uvula midline  Neck: supple without LAD Lungs: unlabored respirations, symmetrical air entry; cough: moderate; no respiratory distress; CTAB Heart: regular rate and rhythm.  Radial pulses 2+ symmetrical bilaterally Skin: warm and dry Psychological: alert and cooperative; normal mood and affect  LABS:  No results found for this or any previous visit (from the  past 24 hour(s)).    RADIOLOGY:  DG Chest 2 View  Result Date: 12/25/2020 CLINICAL DATA:  Cough EXAM: CHEST - 2 VIEW COMPARISON:  Jan 23, 2018 FINDINGS: Lungs are clear. Heart size and pulmonary vascularity are normal. No adenopathy. Postoperative changes noted in the right lower cervical region consistent partial thyroidectomy. IMPRESSION: Lungs clear.  Cardiac silhouette normal. Electronically Signed   By: Bretta Bang III M.D.   On: 12/25/2020 15:08     Chest X-ray is negative for cardiopulmonary disease.  I have reviewed the x-ray myself and the radiologist interpretation.  I am in agreement with the radiologist interpretation.   ASSESSMENT & PLAN:  1. URI with cough and congestion   2. Sore throat   3. Rib pain on left side   4. Encounter for screening for COVID-19     Meds ordered this encounter  Medications  . predniSONE (STERAPRED UNI-PAK 21 TAB) 10 MG (21) TBPK tablet    Sig: Take by mouth daily. Take 6 tabs by mouth daily  for 1 days, then 5 tabs for 1 days, then 4 tabs for 1 days, then 3 tabs for 1 days, 2 tabs for 1 days, then 1 tab by mouth daily for 1 days    Dispense:  21 tablet    Refill:  0  . benzonatate (TESSALON) 100 MG capsule    Sig: Take 1 capsule (100 mg total) by mouth every 8 (eight) hours.    Dispense:  21 capsule    Refill:  0  . doxycycline (VIBRAMYCIN) 100 MG capsule    Sig: Take 1 capsule (100 mg total) by mouth 2 (two) times daily.    Dispense:  20 capsule     Refill:  0   Discharge instructions  Get plenty of rest and push fluids Tessalon Perles prescribed for cough Prednisone was prescribed Doxycycline was prescribed Use medications daily for symptom relief Use OTC medications like ibuprofen or tylenol as needed  pain Call or go to the ED if you have any new or worsening symptoms such as fever, worsening cough, shortness of breath, chest tightness, chest pain, turning blue, changes in mental status, etc...   Reviewed expectations re: course of current medical issues. Questions answered. Outlined signs and symptoms indicating need for more acute intervention. Patient verbalized understanding. After Visit Summary given.         Durward Parcel, FNP 12/25/20 1517    Durward Parcel, FNP 12/25/20 1520

## 2020-12-25 NOTE — Discharge Instructions (Addendum)
Get plenty of rest and push fluids Tessalon Perles prescribed for cough Prednisone was prescribed Doxycycline was prescribed Use medications daily for symptom relief Use OTC medications like ibuprofen or tylenol as needed  pain Call or go to the ED if you have any new or worsening symptoms such as fever, worsening cough, shortness of breath, chest tightness, chest pain, turning blue, changes in mental status, etc..Marland Kitchen

## 2020-12-26 LAB — SARS-COV-2, NAA 2 DAY TAT

## 2020-12-26 LAB — NOVEL CORONAVIRUS, NAA: SARS-CoV-2, NAA: NOT DETECTED

## 2021-03-02 ENCOUNTER — Other Ambulatory Visit: Payer: Self-pay

## 2021-03-02 DIAGNOSIS — Z1231 Encounter for screening mammogram for malignant neoplasm of breast: Secondary | ICD-10-CM

## 2021-03-08 ENCOUNTER — Telehealth: Payer: Self-pay

## 2021-03-08 NOTE — Telephone Encounter (Signed)
Ok with me if it is Ok with Dr. Epimenio Foot

## 2021-03-08 NOTE — Telephone Encounter (Signed)
We received a new referral for this patient for a NCV/EMG bilateral upper extremities. She is requesting a provider switch from Dr Terrace Arabia. To Dr. Epimenio Foot.  Please advise

## 2021-03-30 ENCOUNTER — Encounter: Payer: Self-pay | Admitting: Emergency Medicine

## 2021-03-30 ENCOUNTER — Other Ambulatory Visit: Payer: Self-pay

## 2021-03-30 ENCOUNTER — Ambulatory Visit
Admission: EM | Admit: 2021-03-30 | Discharge: 2021-03-30 | Disposition: A | Discharge status: Home / Self Care | Payer: Medicaid Other | Attending: Internal Medicine | Admitting: Internal Medicine

## 2021-03-30 ENCOUNTER — Ambulatory Visit (INDEPENDENT_AMBULATORY_CARE_PROVIDER_SITE_OTHER): Payer: Medicaid Other

## 2021-03-30 DIAGNOSIS — J440 Chronic obstructive pulmonary disease with acute lower respiratory infection: Secondary | ICD-10-CM

## 2021-03-30 MED ORDER — DOXYCYCLINE HYCLATE 100 MG PO CAPS: 100.0000 mg | ORAL_CAPSULE | Freq: Two times a day (BID) | ORAL | 0 refills | Status: AC

## 2021-03-30 MED ORDER — GUAIFENESIN ER 600 MG PO TB12: 600.0000 mg | ORAL_TABLET | Freq: Two times a day (BID) | ORAL | 0 refills | Status: AC

## 2021-03-30 MED ORDER — BENZONATATE 100 MG PO CAPS: 100.0000 mg | ORAL_CAPSULE | Freq: Three times a day (TID) | ORAL | 0 refills | Status: AC | PRN

## 2021-03-30 NOTE — ED Provider Notes (Addendum)
[facility]    CSN: [id number] Arrival date & time: 03/30/21  1420      History   Chief Complaint Chief Complaint  Patient presents with   Sore Throat    HPI [name] is a 45 y.o. female comes to the urgent care with a 3-day history of sore throat and cough productive of yellowish-brown sputum.  Onset was insidious and has been persistent.  No sick contacts.  Patient is not vaccinated against COVID-19 virus.  She has chest pain with cough but denies any shortness of breath or wheezing.  Patient has no dizziness, near syncope or syncopal episodes.  No nausea, vomiting or diarrhea.  Patient denies any chest tightness.  Blood sugars are fairly well controlled.  Last hemoglobin A1c was 6.4.  Patient smokes about half a pack of cigarettes a day.  She has a 13-1/2 pack year history of smoking.  HPI  Past Medical History:  Diagnosis Date   Anemia    Anxiety    Arthritis    Carpal tunnel syndrome, bilateral 11/22/2015   Will try wrist splints   Community acquired pneumonia    COPD (chronic obstructive pulmonary disease) (HCC)    Decreased libido 03/26/2015   Diabetes mellitus without complication (HCC)    prediabetes   Dyslipidemia 11/24/2015   Empty sella (HCC)    Fibromyalgia    Fibromyalgia    Hot flashes 03/26/2015   Hyperlipidemia    Kidney stones    Productive cough    smoker   Renal disorder    Vitamin D deficiency 11/24/2015    Patient Active Problem List   Diagnosis Date Noted   Pain 04/11/2018   Weakness 04/11/2018   Fatigue 11/30/2016   Body aches 11/30/2016   Vitamin D deficiency 11/24/2015   Dyslipidemia 11/24/2015   Carpal tunnel syndrome, bilateral 11/22/2015   Decreased libido 03/26/2015   Hot flashes 03/26/2015   Kidney stones 04/11/2011    Class: Chronic   Menometrorrhagia 04/11/2011    Class: Chronic   Dyspareunia (not due to a general medical condition) 04/11/2011    Class: Chronic    Past Surgical History:  Procedure  Laterality Date   ABDOMINAL HYSTERECTOMY     has ovaries.    BREAST CYST EXCISION     CESAREAN SECTION  2006   [facility]   LAPAROSCOPIC CHOLECYSTECTOMY  2002   [facility]   LAPAROSCOPIC HYSTERECTOMY  04/12/2011   Procedure: HYSTERECTOMY TOTAL LAPAROSCOPIC;  Surgeon: Lazaro Arms, MD;  Location: [facility];  Service: Gynecology;  Laterality: N/A;   THYROIDECTOMY, PARTIAL  2012   [facility], [facility]    OB History     Gravida  4   Para  4   Term  0   Preterm  0   AB  0   Living  4      SAB  0   IAB  0   Ectopic  0   Multiple  0   Live Births               Home Medications    Prior to Admission medications   Medication Sig Start Date End Date Taking? Authorizing Provider  guaiFENesin (MUCINEX) 600 MG 12 hr tablet Take 1 tablet (600 mg total) by mouth 2 (two) times daily for 10 days. 03/30/21 04/09/21 Yes Harshini Trent, Britta Mccreedy, MD  acetaminophen (TYLENOL) 500 MG tablet Take 1,000 mg by mouth daily as needed for mild pain.     [provider]  albuterol (PROVENTIL HFA;VENTOLIN HFA) 108 (90 BASE) MCG/ACT inhaler Inhale 2 puffs into the lungs every 6 (six) hours as needed for wheezing or shortness of breath.    [provider]  albuterol (PROVENTIL) (2.5 MG/3ML) 0.083% nebulizer solution Take 2.5 mg by nebulization every 6 (six) hours as needed for wheezing or shortness of breath.    [provider]  benzonatate (TESSALON) 100 MG capsule Take 1 capsule (100 mg total) by mouth 3 (three) times daily as needed for up to 10 days for cough. 03/30/21 04/09/21  Merrilee Jansky, MD  Cholecalciferol (VITAMIN D) 2000 units CAPS Take 1 capsule by mouth daily.    [provider]  Cholecalciferol 5000 units capsule Take 1 capsule (5,000 Units total) by mouth daily. 11/24/15   Adline Potter, NP  doxycycline (VIBRAMYCIN) 100 MG capsule Take 1 capsule (100 mg total) by mouth 2 (two) times daily for 7 days. 03/30/21 04/06/21  Merrilee Jansky, MD  DULoxetine (CYMBALTA) 60 MG  capsule Take 1 capsule (60 mg total) by mouth daily. 03/26/18   Aliene Beams, MD  gabapentin (NEURONTIN) 300 MG capsule Take 2 capsules (600 mg total) by mouth 3 (three) times daily. 04/11/18   Levert Feinstein, MD  ipratropium (ATROVENT HFA) 17 MCG/ACT inhaler Inhale 1 puff into the lungs 4 (four) times daily.    [provider]  lisinopril (PRINIVIL,ZESTRIL) 10 MG tablet Take 1 tablet (10 mg total) by mouth daily. 03/26/18   Aliene Beams, MD  metFORMIN (GLUCOPHAGE) 500 MG tablet Take 1 tablet (500 mg total) by mouth 2 (two) times daily with a meal. 02/05/18   Aliene Beams, MD  methotrexate (50 MG/ML) 1 g injection Inject into the vein once a week.    [provider]  Multiple Vitamin (MULTI-DAY VITAMINS PO) Take 1 tablet by mouth daily.    [provider]  Multiple Vitamins-Minerals (MULTIPLE VITAMINS/WOMENS PO) Take 1 tablet by mouth daily.    [provider]  oxyCODONE (OXY IR/ROXICODONE) 5 MG immediate release tablet Take 5 mg by mouth every 6 (six) hours as needed. pain    [provider]  pantoprazole (PROTONIX) 20 MG tablet Take 20 mg by mouth daily.    [provider]  Cimetidine (HEARTBURN RELIEF PO) Take 1 tablet by mouth daily as needed (heartburn).  05/31/20  [provider]    Family History Family History  Problem Relation Age of Onset   Breast cancer Mother    Diabetes Father    Heart failure Father    Migraines Sister    Scoliosis Daughter    ADD / ADHD Son    Cancer Maternal Grandfather        pancreatic   Diabetes Paternal Grandmother    Congestive Heart Failure Paternal Grandmother    ADD / ADHD Daughter    Scoliosis Daughter    Hyperlipidemia Daughter    Diabetes Daughter        prediabetes   Bipolar disorder Son    ADD / ADHD Son     Social History Social History   Tobacco Use   Smoking status: Every Day    Packs/day: 0.50    Years: 17.00    Pack years: 8.50    Types: Cigarettes   Smokeless  tobacco: Never  Vaping Use   Vaping Use: Former  Substance Use Topics   Alcohol use: No   Drug use: No     Allergies   Nsaids, Aspirin, and Macrobid [nitrofurantoin macrocrystal]  Review of Systems Review of Systems As per HPI  Physical Exam Triage Vital Signs ED Triage Vitals  Enc Vitals Group     BP 03/30/21 1439 104/79     Pulse Rate 03/30/21 1439 (!) 109     Resp 03/30/21 1439 19     Temp 03/30/21 1438 98.3 F (36.8 C)     Temp Source 03/30/21 1438 Oral     SpO2 03/30/21 1439 96 %     Weight --      Height --      Head Circumference --      Peak Flow --      Pain Score 03/30/21 1440 9     Pain Loc --      Pain Edu? --      Excl. in GC? --    No data found.  Updated Vital Signs BP 110/81 (BP Location: Right Arm)   Pulse (!) 108   Temp 98.3 F (36.8 C) (Oral)   Resp 19   LMP 01/31/2011   SpO2 97%   Visual Acuity Right Eye Distance:   Left Eye Distance:   Bilateral Distance:    Right Eye Near:   Left Eye Near:    Bilateral Near:     Physical Exam Vitals and nursing note reviewed.  Constitutional:      General: She is in acute distress.     Appearance: She is ill-appearing.  HENT:     Right Ear: Tympanic membrane normal.     Left Ear: Tympanic membrane normal.     Mouth/Throat:     Mouth: Mucous membranes are moist.     Pharynx: No pharyngeal swelling or oropharyngeal exudate.     Tonsils: No tonsillar exudate or tonsillar abscesses.  Cardiovascular:     Rate and Rhythm: Normal rate and regular rhythm.  Pulmonary:     Effort: Pulmonary effort is normal. No respiratory distress.     Breath sounds: Normal breath sounds. No rhonchi or rales.  Abdominal:     General: Bowel sounds are normal.     Palpations: Abdomen is soft.  Neurological:     Mental Status: She is alert.     UC Treatments / Results  Labs (all labs ordered are listed, but only abnormal results are displayed) Labs Reviewed  COVID-19, FLU A+B NAA  CBC WITH  DIFFERENTIAL/PLATELET    EKG   Radiology DG Chest 2 View  Result Date: 03/30/2021 CLINICAL DATA:  Sore throat, BILATERAL ear pain, cough productive of brown sputum for 3 days EXAM: CHEST - 2 VIEW COMPARISON:  12/25/2020 FINDINGS: Normal heart size, mediastinal contours, and pulmonary vascularity. Lungs clear. No pulmonary infiltrate, pleural effusion, or pneumothorax. Osseous structures unremarkable. IMPRESSION: No acute abnormalities. Electronically Signed   By: Ulyses Southward M.D.   On: 03/30/2021 15:51    Procedures Procedures (including critical care time)  Medications Ordered in UC Medications - No data to display  Initial Impression / Assessment and Plan / UC Course  I have reviewed the triage vital signs and the nursing notes.  Pertinent labs & imaging results that were available during my care of the patient were reviewed by me and considered in my medical decision making (see chart for details).     1.  Acute bronchitis in the setting of chronic bronchitis: Chest x-ray is negative for acute lung infiltrate Doxycycline 100 mg twice daily for 7 days Continue bronchodilator use No wheezing appreciated-hence I will not prescribe any steroids since that we  will upset her blood sugars. Return precautions given CBC, COVID-19/flu A+B PCR test has been sent Smoke cessation counseling given.  Time for smoke cessation advised is less than 10 minutes.  Patient is in the precontemplative state of smoke cessation. Final Clinical Impressions(s) / UC Diagnoses   Final diagnoses:  Bronchitis, chronic obstructive w acute bronchitis (HCC)     Discharge Instructions      Please take medications as prescribed Your chest x-ray is negative for pneumonia Use your inhalers to help with shortness of breath, chest tightness or wheezing female care. We will call you with recommendations if labs are abnormal Return to urgent care if you experience worsening shortness of breath, wheezing, fever  or persistent vomiting/nausea.   ED Prescriptions     Medication Sig Dispense Auth. Provider   doxycycline (VIBRAMYCIN) 100 MG capsule Take 1 capsule (100 mg total) by mouth 2 (two) times daily for 7 days. 20 capsule Jeralynn Vaquera, Britta Mccreedy, MD   benzonatate (TESSALON) 100 MG capsule Take 1 capsule (100 mg total) by mouth 3 (three) times daily as needed for up to 10 days for cough. 20 capsule Kawanna Christley, Britta Mccreedy, MD   guaiFENesin (MUCINEX) 600 MG 12 hr tablet Take 1 tablet (600 mg total) by mouth 2 (two) times daily for 10 days. 20 tablet Marge Vandermeulen, Britta Mccreedy, MD      PDMP not reviewed this encounter.   Merrilee Jansky, MD 03/30/21 1601    Merrilee Jansky, MD 03/30/21 [phone]

## 2021-03-30 NOTE — Discharge Instructions (Signed)
Please take medications as prescribed Your chest x-ray is negative for pneumonia Use your inhalers to help with shortness of breath, chest tightness or wheezing female care. We will call you with recommendations if labs are abnormal Return to urgent care if you experience worsening shortness of breath, wheezing, fever or persistent vomiting/nausea.

## 2021-03-30 NOTE — ED Triage Notes (Signed)
Sore throat, bilateral ear pain  and coughing up brown/yellow phlegm x 3 days

## 2021-03-31 LAB — CBC WITH DIFFERENTIAL/PLATELET
Basophils Absolute: 0 10*3/uL (ref 0.0–0.2)
Basos: 0 %
EOS (ABSOLUTE): 0.1 10*3/uL (ref 0.0–0.4)
Eos: 1 %
Hematocrit: 44.7 % (ref 34.0–46.6)
Hemoglobin: 15.4 g/dL (ref 11.1–15.9)
Immature Grans (Abs): 0.1 10*3/uL (ref 0.0–0.1)
Immature Granulocytes: 1 %
Lymphocytes Absolute: 2.7 10*3/uL (ref 0.7–3.1)
Lymphs: 26 %
MCH: 32.8 pg (ref 26.6–33.0)
MCHC: 34.5 g/dL (ref 31.5–35.7)
MCV: 95 fL (ref 79–97)
Monocytes Absolute: 0.9 10*3/uL (ref 0.1–0.9)
Monocytes: 9 %
Neutrophils Absolute: 6.5 10*3/uL (ref 1.4–7.0)
Neutrophils: 63 %
Platelets: 312 10*3/uL (ref 150–450)
RBC: 4.7 x10E6/uL (ref 3.77–5.28)
RDW: 13.6 % (ref 11.7–15.4)
WBC: 10.3 10*3/uL (ref 3.4–10.8)

## 2021-03-31 LAB — COVID-19, FLU A+B NAA
Influenza A, NAA: NOT DETECTED
Influenza B, NAA: NOT DETECTED
SARS-CoV-2, NAA: NOT DETECTED

## 2021-04-05 ENCOUNTER — Telehealth: Payer: Self-pay | Admitting: Neurology

## 2021-04-05 NOTE — Telephone Encounter (Signed)
7/20 ncv/emg cancelled due to nicole out- unable to lvm for pt, mychart message sent.

## 2021-04-06 ENCOUNTER — Encounter: Payer: Medicaid Other | Admitting: Neurology

## 2021-04-19 ENCOUNTER — Other Ambulatory Visit: Payer: Self-pay

## 2021-04-19 ENCOUNTER — Ambulatory Visit
Admission: RE | Admit: 2021-04-19 | Discharge: 2021-04-19 | Disposition: A | Discharge status: Home / Self Care | Payer: Medicaid Other | Source: Ambulatory Visit | Attending: *Deleted | Admitting: *Deleted

## 2021-04-19 DIAGNOSIS — Z1231 Encounter for screening mammogram for malignant neoplasm of breast: Secondary | ICD-10-CM

## 2021-05-15 ENCOUNTER — Encounter: Payer: Self-pay | Admitting: Emergency Medicine

## 2021-05-15 ENCOUNTER — Ambulatory Visit
Admission: EM | Admit: 2021-05-15 | Discharge: 2021-05-15 | Disposition: A | Discharge status: Home / Self Care | Payer: Medicaid Other | Attending: Family Medicine | Admitting: Family Medicine

## 2021-05-15 DIAGNOSIS — J22 Unspecified acute lower respiratory infection: Secondary | ICD-10-CM | POA: Diagnosis not present

## 2021-05-15 DIAGNOSIS — J029 Acute pharyngitis, unspecified: Secondary | ICD-10-CM

## 2021-05-15 DIAGNOSIS — L03111 Cellulitis of right axilla: Secondary | ICD-10-CM | POA: Diagnosis not present

## 2021-05-15 MED ORDER — SULFAMETHOXAZOLE-TRIMETHOPRIM 800-160 MG PO TABS: 1.0000 | ORAL_TABLET | Freq: Two times a day (BID) | ORAL | 0 refills | Status: AC

## 2021-05-15 MED ORDER — DEXAMETHASONE SODIUM PHOSPHATE 10 MG/ML IJ SOLN
10.0000 mg | Freq: Once | INTRAMUSCULAR | Status: AC
  Administered 2021-05-15: 10 mg via INTRAMUSCULAR

## 2021-05-15 MED ORDER — PREDNISONE 20 MG PO TABS: 40.0000 mg | ORAL_TABLET | Freq: Every day | ORAL | 0 refills | Status: DC

## 2021-05-15 NOTE — ED Triage Notes (Signed)
Sore throat and congestion since Thursday

## 2021-05-15 NOTE — ED Provider Notes (Signed)
[facility]    CSN: [id number] Arrival date & time: 05/15/21  1328      History   Chief Complaint Chief Complaint  Patient presents with   Sore Throat    HPI Cassandra Lucas is a 45 y.o. female.   HPI Patient presents today for evaluaiton loss of voice, sore throat , wheezing, and cough. Patient has COPD and DM.  Denies any known exposure to anyone positive for COVID-19.  She had attempted relief with her albuterol inhaler however has ran out of inhaler and breathing has progressively worsened.  Patient has been seen in July with acute on chronic bronchitis exacerbation.  She is afebrile.  Endorses some chest tightness as she is unable to fully expand her chest wall with breathing. Past Medical History:  Diagnosis Date   Anemia    Anxiety    Arthritis    Carpal tunnel syndrome, bilateral 11/22/2015   Will try wrist splints   Community acquired pneumonia    COPD (chronic obstructive pulmonary disease) (HCC)    Decreased libido 03/26/2015   Diabetes mellitus without complication (HCC)    prediabetes   Dyslipidemia 11/24/2015   Empty sella (HCC)    Fibromyalgia    Fibromyalgia    Hot flashes 03/26/2015   Hyperlipidemia    Kidney stones    Productive cough    smoker   Renal disorder    Vitamin D deficiency 11/24/2015    Patient Active Problem List   Diagnosis Date Noted   Pain 04/11/2018   Weakness 04/11/2018   Fatigue 11/30/2016   Body aches 11/30/2016   Vitamin D deficiency 11/24/2015   Dyslipidemia 11/24/2015   Carpal tunnel syndrome, bilateral 11/22/2015   Decreased libido 03/26/2015   Hot flashes 03/26/2015   Kidney stones 04/11/2011    Class: Chronic   Menometrorrhagia 04/11/2011    Class: Chronic   Dyspareunia (not due to a general medical condition) 04/11/2011    Class: Chronic    Past Surgical History:  Procedure Laterality Date   ABDOMINAL HYSTERECTOMY     has ovaries.    BREAST CYST EXCISION     CESAREAN SECTION  2006   [facility]    LAPAROSCOPIC CHOLECYSTECTOMY  2002   [facility]   LAPAROSCOPIC HYSTERECTOMY  04/12/2011   Procedure: HYSTERECTOMY TOTAL LAPAROSCOPIC;  Surgeon: Lazaro Arms, MD;  Location: [facility];  Service: Gynecology;  Laterality: N/A;   THYROIDECTOMY, PARTIAL  2012   [facility]    OB History     Gravida  4   Para  4   Term  0   Preterm  0   AB  0   Living  4      SAB  0   IAB  0   Ectopic  0   Multiple  0   Live Births               Home Medications    Prior to Admission medications   Medication Sig Start Date End Date Taking? Authorizing Provider  predniSONE (DELTASONE) 20 MG tablet Take 2 tablets (40 mg total) by mouth daily with breakfast. 05/15/21  Yes Bing Neighbors, FNP  sulfamethoxazole-trimethoprim (BACTRIM DS) 800-160 MG tablet Take 1 tablet by mouth 2 (two) times daily for 7 days. 05/15/21 05/22/21 Yes Bing Neighbors, FNP  acetaminophen (TYLENOL) 500 MG tablet Take 1,000 mg by mouth daily as needed for mild pain.     [provider]  albuterol (PROVENTIL HFA;VENTOLIN HFA) 108 (  90 BASE) MCG/ACT inhaler Inhale 2 puffs into the lungs every 6 (six) hours as needed for wheezing or shortness of breath.    [provider]  albuterol (PROVENTIL) (2.5 MG/3ML) 0.083% nebulizer solution Take 2.5 mg by nebulization every 6 (six) hours as needed for wheezing or shortness of breath.    [provider]  Cholecalciferol (VITAMIN D) 2000 units CAPS Take 1 capsule by mouth daily.    [provider]  Cholecalciferol 5000 units capsule Take 1 capsule (5,000 Units total) by mouth daily. 11/24/15   Adline Potter, NP  DULoxetine (CYMBALTA) 60 MG capsule Take 1 capsule (60 mg total) by mouth daily. 03/26/18   Aliene Beams, MD  gabapentin (NEURONTIN) 300 MG capsule Take 2 capsules (600 mg total) by mouth 3 (three) times daily. 04/11/18   Levert Feinstein, MD  ipratropium (ATROVENT HFA) 17 MCG/ACT inhaler Inhale 1 puff into the lungs 4 (four) times daily.     [provider]  lisinopril (PRINIVIL,ZESTRIL) 10 MG tablet Take 1 tablet (10 mg total) by mouth daily. 03/26/18   Aliene Beams, MD  metFORMIN (GLUCOPHAGE) 500 MG tablet Take 1 tablet (500 mg total) by mouth 2 (two) times daily with a meal. 02/05/18   Aliene Beams, MD  methotrexate (50 MG/ML) 1 g injection Inject into the vein once a week.    [provider]  Multiple Vitamin (MULTI-DAY VITAMINS PO) Take 1 tablet by mouth daily.    [provider]  Multiple Vitamins-Minerals (MULTIPLE VITAMINS/WOMENS PO) Take 1 tablet by mouth daily.    [provider]  oxyCODONE (OXY IR/ROXICODONE) 5 MG immediate release tablet Take 5 mg by mouth every 6 (six) hours as needed. pain    [provider]  pantoprazole (PROTONIX) 20 MG tablet Take 20 mg by mouth daily.    [provider]  Cimetidine (HEARTBURN RELIEF PO) Take 1 tablet by mouth daily as needed (heartburn).  05/31/20  [provider]    Family History Family History  Problem Relation Age of Onset   Breast cancer Mother    Diabetes Father    Heart failure Father    Migraines Sister    Scoliosis Daughter    ADD / ADHD Son    Cancer Maternal Grandfather        pancreatic   Diabetes Paternal Grandmother    Congestive Heart Failure Paternal Grandmother    ADD / ADHD Daughter    Scoliosis Daughter    Hyperlipidemia Daughter    Diabetes Daughter        prediabetes   Bipolar disorder Son    ADD / ADHD Son     Social History Social History   Tobacco Use   Smoking status: Every Day    Packs/day: 0.50    Years: 17.00    Pack years: 8.50    Types: Cigarettes   Smokeless tobacco: Never  Vaping Use   Vaping Use: Former  Substance Use Topics   Alcohol use: No   Drug use: No     Allergies   Nsaids, Aspirin, and Macrobid [nitrofurantoin macrocrystal]   Review of Systems Review of Systems Pertinent negatives listed in HPI   Physical Exam Triage Vital Signs ED Triage  Vitals [05/15/21 1449]  Enc Vitals Group     BP 117/80     Pulse Rate (!) 127     Resp 17     Temp 98.9 F (37.2 C)     Temp Source Oral  SpO2 94 %     Weight      Height      Head Circumference      Peak Flow      Pain Score      Pain Loc      Pain Edu?      Excl. in GC?    No data found.  Updated Vital Signs BP 117/80 (BP Location: Right Arm)   Pulse (!) 127 Comment: pt states this is her normal  Temp 98.9 F (37.2 C) (Oral)   Resp 17   LMP 01/31/2011   SpO2 94%   Visual Acuity Right Eye Distance:   Left Eye Distance:   Bilateral Distance:    Right Eye Near:   Left Eye Near:    Bilateral Near:     Physical Exam Constitutional:      Appearance: She is ill-appearing.  HENT:     Head: Normocephalic and atraumatic.     Right Ear: Tympanic membrane normal.     Left Ear: Tympanic membrane normal.     Nose: Congestion present.     Mouth/Throat:     Tonsils: No tonsillar exudate or tonsillar abscesses.  Cardiovascular:     Rate and Rhythm: Normal rate and regular rhythm.  Pulmonary:     Breath sounds: Wheezing and rhonchi present.  Lymphadenopathy:     Cervical: No cervical adenopathy.  Skin:    General: Skin is warm.     Capillary Refill: Capillary refill takes less than 2 seconds.     Comments: Right axillary abscess 4.5 mm induration without fluctuance moderate erythema non-extending beyond the site of the abscess  Neurological:     General: No focal deficit present.     Mental Status: She is alert and oriented to person, place, and time.  Psychiatric:        Mood and Affect: Mood normal.     UC Treatments / Results  Labs (all labs ordered are listed, but only abnormal results are displayed) Labs Reviewed - No data to display  EKG   Radiology No results found.  Procedures Procedures (including critical care time)  Medications Ordered in UC Medications  dexamethasone (DECADRON) injection 10 mg (10 mg Intramuscular Given 05/15/21 1537)     Initial Impression / Assessment and Plan / UC Course  I have reviewed the triage vital signs and the nursing notes.  Pertinent labs & imaging results that were available during my care of the patient were reviewed by me and considered in my medical decision making (see chart for details).    Acute lower respiratory infection Decadron 10 mg IM given here in clinic.  Start albuterol 2 puffs every 4-6 hours as needed for shortness of breath or wheezing. Acute pharyngitis likely secondary to postnasal drainage.  Cellulitis of axillary start Bactrim twice daily for 14 days.  Warm compresses.  Return precautions given.  Final Clinical Impressions(s) / UC Diagnoses   Final diagnoses:  Acute lower respiratory infection  Cellulitis of axilla, right  Acute pharyngitis, unspecified etiology   Discharge Instructions   None    ED Prescriptions     Medication Sig Dispense Auth. Provider   predniSONE (DELTASONE) 20 MG tablet Take 2 tablets (40 mg total) by mouth daily with breakfast. 10 tablet Bing Neighbors, FNP   sulfamethoxazole-trimethoprim (BACTRIM DS) 800-160 MG tablet Take 1 tablet by mouth 2 (two) times daily for 7 days. 14 tablet Bing Neighbors, FNP      PDMP not  reviewed this encounter.   Bing Neighbors, FNP 05/17/21 [phone]

## 2021-05-25 ENCOUNTER — Encounter: Payer: Medicaid Other | Admitting: Neurology

## 2021-05-25 ENCOUNTER — Other Ambulatory Visit: Payer: Self-pay

## 2021-05-25 ENCOUNTER — Ambulatory Visit (INDEPENDENT_AMBULATORY_CARE_PROVIDER_SITE_OTHER): Payer: Medicaid Other | Admitting: Neurology

## 2021-05-25 DIAGNOSIS — Z0289 Encounter for other administrative examinations: Secondary | ICD-10-CM

## 2021-05-25 DIAGNOSIS — G5603 Carpal tunnel syndrome, bilateral upper limbs: Secondary | ICD-10-CM | POA: Diagnosis not present

## 2021-05-25 NOTE — Progress Notes (Signed)
Full Name: Nazaria Riesen Gender: Female MRN #: [id number] Date of Birth: [birth year]    Visit Date: 05/25/2021 14:29 Age: 45 Years Examining Physician: Despina Arias, MD  Referring Physician: Merryl Hacker, PA    History: Ms. Koval is a 45 year old woman with right greater than left arm/hand numbness and pain.  She also notes myalgias.  On examination, strength was 4+/5 in the right APB muscle.  An NCV/EMG study 05/17/2018 showed right greater than left carpal tunnel syndrome.  Nerve conduction studies: Right median motor response was moderately delayed and latency with a borderline low amplitude but normal forearm conduction.  The left median motor response had a borderline distal latency with normal amplitude and forearm conduction.  The right ulnar motor response had normal distal latency, amplitude and forearm conduction.  The right median sensory response was nonreactive.  The left median sensory response was mildly delayed in latency with borderline normal amplitude.  The right ulnar sensory response had normal peak latency and amplitude.  Electromyography: Needle EMG of selected muscles of the right arm and hand was performed.  Motor unit morphology and recruitment was normal.  There was no abnormal spontaneous activity.  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  This has progressed compared to the 2019 study. Mild left median neuropathy across the wrist.  This is actually mildly improved compared to the 2019 study. No evidence of radiculopathy or myopathy involving the right arm.  Verbal informed consent was obtained from the patient, patient was informed of potential risk of procedure, including bruising, bleeding, hematoma formation, infection, muscle weakness, muscle pain, numbness, among others.    Clinical note: Due to worsening of the median neuropathy on the  right, she might benefit from carpal tunnel release. --RAS      MNC    Nerve / Sites Muscle Latency Ref. Amplitude Ref. Rel Amp Segments Distance Velocity Ref. Area    ms ms mV mV %  cm m/s m/s mVms  R Median - APB     Wrist APB 8.0 ?4.4 4.0 ?4.0 100 Wrist - APB 7   17.3     Upper arm APB 11.1  3.3  82.3 Upper arm - Wrist 16 51 ?49 16.3  L Median - APB     Wrist APB 4.4 ?4.4 6.1 ?4.0 100 Wrist - APB 7   26.0     Upper arm APB 7.6  6.1  99.9 Upper arm - Wrist 17 52 ?49 24.6  R Ulnar - ADM     Wrist ADM 2.9 ?3.3 8.3 ?6.0 100 Wrist - ADM 7   22.3     B.Elbow ADM 4.9  7.3  87.7 B.Elbow - Wrist 15 72 ?49 19.1     A.Elbow ADM 6.4  6.4  87.7 A.Elbow - B.Elbow 10 70 ?49 17.3           SNC    Nerve / Sites Rec. Site Peak Lat Ref.  Amp Ref. Segments Distance    ms ms V V  cm  R Median - Orthodromic (Dig II, Mid palm)     Dig II Wrist NR ?3.4 NR ?10 Dig II - Wrist 13  L Median - Orthodromic (Dig II, Mid palm)     Dig II Wrist 4.4 ?3.4 10 ?10 Dig II - Wrist 13  R Ulnar - Orthodromic, (Dig V, Mid palm)     Dig V Wrist  2.4 ?3.1 6 ?5 Dig V - Wrist 66           F  Wave    Nerve F Lat Ref.   ms ms  R Ulnar - ADM 23.5 ?32.0       EMG Summary Table    Spontaneous MUAP Recruitment  Muscle IA Fib PSW Fasc Other Amp Dur. Poly Pattern  R. Deltoid Normal None None None _______ Normal Normal Normal Normal  R. Triceps brachii Normal None None None _______ Normal Normal Normal Normal  R. Biceps brachii Normal None None None _______ Normal Normal Normal Normal  R. Extensor digitorum communis Normal None None None _______ Normal Normal Normal Normal  R. Abductor pollicis brevis Normal None None None _______ Normal Normal Normal Normal  R. First dorsal interosseous Normal None None None _______ Normal Normal Normal Normal

## 2021-06-06 ENCOUNTER — Ambulatory Visit: Payer: Medicaid Other | Admitting: Neurology

## 2021-07-11 ENCOUNTER — Encounter: Payer: Self-pay | Admitting: Emergency Medicine

## 2021-07-11 ENCOUNTER — Emergency Department (HOSPITAL_COMMUNITY): Payer: Medicaid Other

## 2021-07-11 ENCOUNTER — Encounter (HOSPITAL_COMMUNITY): Payer: Self-pay | Admitting: *Deleted

## 2021-07-11 ENCOUNTER — Ambulatory Visit
Admission: EM | Admit: 2021-07-11 | Discharge: 2021-07-11 | Payer: Medicaid Other | Attending: Urgent Care | Admitting: Urgent Care

## 2021-07-11 ENCOUNTER — Emergency Department (HOSPITAL_COMMUNITY)
Admission: EM | Admit: 2021-07-11 | Discharge: 2021-07-11 | Disposition: A | Discharge status: Home / Self Care | Payer: Medicaid Other | Attending: Emergency Medicine | Admitting: Emergency Medicine

## 2021-07-11 ENCOUNTER — Other Ambulatory Visit: Payer: Self-pay

## 2021-07-11 DIAGNOSIS — N23 Unspecified renal colic: Secondary | ICD-10-CM

## 2021-07-11 DIAGNOSIS — Z7951 Long term (current) use of inhaled steroids: Secondary | ICD-10-CM | POA: Insufficient documentation

## 2021-07-11 DIAGNOSIS — E119 Type 2 diabetes mellitus without complications: Secondary | ICD-10-CM | POA: Diagnosis not present

## 2021-07-11 DIAGNOSIS — F17210 Nicotine dependence, cigarettes, uncomplicated: Secondary | ICD-10-CM | POA: Diagnosis not present

## 2021-07-11 DIAGNOSIS — R109 Unspecified abdominal pain: Secondary | ICD-10-CM

## 2021-07-11 DIAGNOSIS — J449 Chronic obstructive pulmonary disease, unspecified: Secondary | ICD-10-CM | POA: Diagnosis not present

## 2021-07-11 DIAGNOSIS — N200 Calculus of kidney: Secondary | ICD-10-CM | POA: Insufficient documentation

## 2021-07-11 DIAGNOSIS — R1011 Right upper quadrant pain: Secondary | ICD-10-CM

## 2021-07-11 DIAGNOSIS — K5790 Diverticulosis of intestine, part unspecified, without perforation or abscess without bleeding: Secondary | ICD-10-CM

## 2021-07-11 DIAGNOSIS — R1031 Right lower quadrant pain: Secondary | ICD-10-CM

## 2021-07-11 DIAGNOSIS — Z7984 Long term (current) use of oral hypoglycemic drugs: Secondary | ICD-10-CM | POA: Diagnosis not present

## 2021-07-11 LAB — POCT URINALYSIS DIP (MANUAL ENTRY)
Bilirubin, UA: NEGATIVE
Glucose, UA: NEGATIVE mg/dL
Ketones, POC UA: NEGATIVE mg/dL
Nitrite, UA: NEGATIVE
Protein Ur, POC: NEGATIVE mg/dL
Spec Grav, UA: 1.025 (ref 1.010–1.025)
Urobilinogen, UA: 0.2 E.U./dL
pH, UA: 5.5 (ref 5.0–8.0)

## 2021-07-11 LAB — COMPREHENSIVE METABOLIC PANEL
ALT: 21 U/L (ref 0–44)
AST: 18 U/L (ref 15–41)
Albumin: 4.2 g/dL (ref 3.5–5.0)
Alkaline Phosphatase: 88 U/L (ref 38–126)
Anion gap: 8 (ref 5–15)
BUN: 10 mg/dL (ref 6–20)
CO2: 27 mmol/L (ref 22–32)
Calcium: 9.1 mg/dL (ref 8.9–10.3)
Chloride: 104 mmol/L (ref 98–111)
Creatinine, Ser: 0.59 mg/dL (ref 0.44–1.00)
GFR, Estimated: >60 mL/min (ref >60)
Glucose, Bld: 102 mg/dL — ABNORMAL HIGH (ref 70–99)
Potassium: 3.9 mmol/L (ref 3.5–5.1)
Sodium: 139 mmol/L (ref 135–145)
Total Bilirubin: 0.3 mg/dL (ref 0.3–1.2)
Total Protein: 7.1 g/dL (ref 6.5–8.1)

## 2021-07-11 LAB — CBC WITH DIFFERENTIAL/PLATELET
Abs Immature Granulocytes: 0.05 10*3/uL (ref 0.00–0.07)
Basophils Absolute: 0.1 10*3/uL (ref 0.0–0.1)
Basophils Relative: 1 %
Eosinophils Absolute: 0.2 10*3/uL (ref 0.0–0.5)
Eosinophils Relative: 2 %
HCT: 44.7 % (ref 36.0–46.0)
Hemoglobin: 14.9 g/dL (ref 12.0–15.0)
Immature Granulocytes: 1 %
Lymphocytes Relative: 30 %
Lymphs Abs: 3 10*3/uL (ref 0.7–4.0)
MCH: 32 pg (ref 26.0–34.0)
MCHC: 33.3 g/dL (ref 30.0–36.0)
MCV: 95.9 fL (ref 80.0–100.0)
Monocytes Absolute: 0.5 10*3/uL (ref 0.1–1.0)
Monocytes Relative: 5 %
Neutro Abs: 6.2 10*3/uL (ref 1.7–7.7)
Neutrophils Relative %: 61 %
Platelets: 242 10*3/uL (ref 150–400)
RBC: 4.66 MIL/uL (ref 3.87–5.11)
RDW: 12.7 % (ref 11.5–15.5)
WBC: 9.9 10*3/uL (ref 4.0–10.5)
nRBC: 0 % (ref 0.0–0.2)

## 2021-07-11 LAB — URINALYSIS, ROUTINE W REFLEX MICROSCOPIC
Bacteria, UA: NONE SEEN
Bilirubin Urine: NEGATIVE
Glucose, UA: NEGATIVE mg/dL
Ketones, ur: NEGATIVE mg/dL
Leukocytes,Ua: NEGATIVE
Nitrite: NEGATIVE
Protein, ur: NEGATIVE mg/dL
Specific Gravity, Urine: 1.017 (ref 1.005–1.030)
pH: 6 (ref 5.0–8.0)

## 2021-07-11 LAB — WET PREP, GENITAL
Clue Cells Wet Prep HPF POC: NONE SEEN
Sperm: NONE SEEN
Trich, Wet Prep: NONE SEEN
Yeast Wet Prep HPF POC: NONE SEEN

## 2021-07-11 LAB — LIPASE, BLOOD: Lipase: 33 U/L (ref 11–51)

## 2021-07-11 LAB — HCG, SERUM, QUALITATIVE: Preg, Serum: NEGATIVE

## 2021-07-11 MED ORDER — ONDANSETRON 4 MG PO TBDP
4.0000 mg | ORAL_TABLET | Freq: Once | ORAL | Status: AC
  Administered 2021-07-11: 4 mg via ORAL
  Filled 2021-07-11: qty 1

## 2021-07-11 MED ORDER — CEFTRIAXONE SODIUM 500 MG IJ SOLR
500.0000 mg | Freq: Once | INTRAMUSCULAR | Status: AC
  Administered 2021-07-11: 500 mg via INTRAMUSCULAR
  Filled 2021-07-11: qty 500

## 2021-07-11 MED ORDER — DOXYCYCLINE HYCLATE 100 MG PO CAPS: 100.0000 mg | ORAL_CAPSULE | Freq: Two times a day (BID) | ORAL | 0 refills | Status: AC

## 2021-07-11 MED ORDER — LIDOCAINE HCL (PF) 1 % IJ SOLN
1.0000 mL | Freq: Once | INTRAMUSCULAR | Status: AC
  Administered 2021-07-11: 1 mL
  Filled 2021-07-11: qty 30

## 2021-07-11 MED ORDER — METRONIDAZOLE 500 MG PO TABS: 500.0000 mg | ORAL_TABLET | Freq: Two times a day (BID) | ORAL | 0 refills | Status: AC

## 2021-07-11 NOTE — ED Provider Notes (Signed)
[facility]   MRN: [id number] DOB: 19-Nov-1975  Subjective:   Cassandra Lucas is a 45 y.o. female presenting for 4-day history of acute onset persistent and worsening moderate right-sided abdominal pain quadrant and also feels it in her right flank side that radiates anteriorly towards the pelvic area.  Patient has a history of cholecystectomy.  Has a history of recurrent renal stones.  Denies fever, nausea, vomiting, any changes to her constipation.  No bloody stools.  Patient does have a history of diverticulosis.  No current facility-administered medications for this encounter.  Current Outpatient Medications:    acetaminophen (TYLENOL) 500 MG tablet, Take 1,000 mg by mouth daily as needed for mild pain. , Disp: , Rfl:    albuterol (PROVENTIL HFA;VENTOLIN HFA) 108 (90 BASE) MCG/ACT inhaler, Inhale 2 puffs into the lungs every 6 (six) hours as needed for wheezing or shortness of breath., Disp: , Rfl:    albuterol (PROVENTIL) (2.5 MG/3ML) 0.083% nebulizer solution, Take 2.5 mg by nebulization every 6 (six) hours as needed for wheezing or shortness of breath., Disp: , Rfl:    Cholecalciferol (VITAMIN D) 2000 units CAPS, Take 1 capsule by mouth daily., Disp: , Rfl:    Cholecalciferol 5000 units capsule, Take 1 capsule (5,000 Units total) by mouth daily., Disp: , Rfl:    DULoxetine (CYMBALTA) 60 MG capsule, Take 1 capsule (60 mg total) by mouth daily., Disp: 30 capsule, Rfl: 0   gabapentin (NEURONTIN) 300 MG capsule, Take 2 capsules (600 mg total) by mouth 3 (three) times daily., Disp: 180 capsule, Rfl: 11   ipratropium (ATROVENT HFA) 17 MCG/ACT inhaler, Inhale 1 puff into the lungs 4 (four) times daily., Disp: , Rfl:    lisinopril (PRINIVIL,ZESTRIL) 10 MG tablet, Take 1 tablet (10 mg total) by mouth daily., Disp: 90 tablet, Rfl: 3   metFORMIN (GLUCOPHAGE) 500 MG tablet, Take 1 tablet (500 mg total) by mouth 2 (two) times daily with a meal., Disp: 180 tablet, Rfl: 3    methotrexate (50 MG/ML) 1 g injection, Inject into the vein once a week., Disp: , Rfl:    Multiple Vitamin (MULTI-DAY VITAMINS PO), Take 1 tablet by mouth daily., Disp: , Rfl:    Multiple Vitamins-Minerals (MULTIPLE VITAMINS/WOMENS PO), Take 1 tablet by mouth daily., Disp: , Rfl:    oxyCODONE (OXY IR/ROXICODONE) 5 MG immediate release tablet, Take 5 mg by mouth every 6 (six) hours as needed. pain, Disp: , Rfl:    pantoprazole (PROTONIX) 20 MG tablet, Take 20 mg by mouth daily., Disp: , Rfl:    predniSONE (DELTASONE) 20 MG tablet, Take 2 tablets (40 mg total) by mouth daily with breakfast., Disp: 10 tablet, Rfl: 0   Allergies  Allergen Reactions   Nsaids Shortness Of Breath   Aspirin Other (See Comments)    Unknown childhood reaction   Macrobid [Nitrofurantoin Macrocrystal] Nausea And Vomiting    Past Medical History:  Diagnosis Date   Anemia    Anxiety    Arthritis    Carpal tunnel syndrome, bilateral 11/22/2015   Will try wrist splints   Community acquired pneumonia    COPD (chronic obstructive pulmonary disease) (HCC)    Decreased libido 03/26/2015   Diabetes mellitus without complication (HCC)    prediabetes   Dyslipidemia 11/24/2015   Empty sella (HCC)    Fibromyalgia    Fibromyalgia    Hot flashes 03/26/2015   Hyperlipidemia    Kidney stones    Productive cough    smoker   Renal  disorder    Vitamin D deficiency 11/24/2015     Past Surgical History:  Procedure Laterality Date   ABDOMINAL HYSTERECTOMY     has ovaries.    BREAST CYST EXCISION     CESAREAN SECTION  2006   [facility]   LAPAROSCOPIC CHOLECYSTECTOMY  2002   [facility]   LAPAROSCOPIC HYSTERECTOMY  04/12/2011   Procedure: HYSTERECTOMY TOTAL LAPAROSCOPIC;  Surgeon: Lazaro Arms, MD;  Location: [facility];  Service: Gynecology;  Laterality: N/A;   THYROIDECTOMY, PARTIAL  2012   [facility], [facility]    Family History  Problem Relation Age of Onset   Breast cancer Mother    Diabetes Father    Heart failure Father    Migraines Sister     Scoliosis Daughter    ADD / ADHD Son    Cancer Maternal Grandfather        pancreatic   Diabetes Paternal Grandmother    Congestive Heart Failure Paternal Grandmother    ADD / ADHD Daughter    Scoliosis Daughter    Hyperlipidemia Daughter    Diabetes Daughter        prediabetes   Bipolar disorder Son    ADD / ADHD Son     Social History   Tobacco Use   Smoking status: Every Day    Packs/day: 0.50    Years: 17.00    Pack years: 8.50    Types: Cigarettes   Smokeless tobacco: Never  Vaping Use   Vaping Use: Former  Substance Use Topics   Alcohol use: No   Drug use: No    ROS   Objective:   Vitals: BP 120/86   Pulse (!) 101   Temp 97.9 F (36.6 C)   Resp 18   LMP 01/31/2011   SpO2 97%   Physical Exam Constitutional:      General: She is not in acute distress.    Appearance: Normal appearance. She is well-developed. She is not ill-appearing, toxic-appearing or diaphoretic.  HENT:     Head: Normocephalic and atraumatic.     Nose: Nose normal.     Mouth/Throat:     Mouth: Mucous membranes are moist.     Pharynx: Oropharynx is clear.  Eyes:     General: No scleral icterus.       Right eye: No discharge.        Left eye: No discharge.     Extraocular Movements: Extraocular movements intact.     Conjunctiva/sclera: Conjunctivae normal.     Pupils: Pupils are equal, round, and reactive to light.  Cardiovascular:     Rate and Rhythm: Normal rate.  Pulmonary:     Effort: Pulmonary effort is normal.  Abdominal:     General: Bowel sounds are normal. There is no distension.     Palpations: Abdomen is soft. There is no mass.     Tenderness: There is abdominal tenderness (right flank side, RUQ). There is no right CVA tenderness, left CVA tenderness, guarding or rebound.  Skin:    General: Skin is warm and dry.  Neurological:     General: No focal deficit present.     Mental Status: She is alert and oriented to person, place, and time.  Psychiatric:         Mood and Affect: Mood normal.        Behavior: Behavior normal.        Thought Content: Thought content normal.        Judgment: Judgment normal.  Results for orders placed or performed during the hospital encounter of 07/11/21 (from the past 24 hour(s))  POCT urinalysis dipstick     Status: Abnormal   Collection Time: 07/11/21 10:50 AM  Result Value Ref Range   Color, UA yellow yellow   Clarity, UA clear clear   Glucose, UA negative negative mg/dL   Bilirubin, UA negative negative   Ketones, POC UA negative negative mg/dL   Spec Grav, UA 1.735 6.701 - 1.025   Blood, UA small (A) negative   pH, UA 5.5 5.0 - 8.0   Protein Ur, POC negative negative mg/dL   Urobilinogen, UA 0.2 0.2 or 1.0 E.U./dL   Nitrite, UA Negative Negative   Leukocytes, UA Trace (A) Negative    Assessment and Plan :   I have reviewed the PDMP during this encounter.  1. RUQ abdominal pain   2. Right flank pain   3. Renal colic on right side   4. Diverticulosis    Discussed the possibility of renal colic but given her significant abdominal pain, history of diverticulosis recommended further evaluation in the ER for rule out of an acute abdomen, diverticulitis, obstructive uropathy. Patient contracts for safety and will present to the ER now by personal vehicle.    Wallis Bamberg, PA-C 07/11/21 1540

## 2021-07-11 NOTE — Discharge Instructions (Addendum)
I have started you on antibiotics please take them as prescribed.  Please be aware that Flagyl reacts poorly with alcohol do not drink alcohol while taking this medication.  [facility] in the next few days for reevaluation.  I want to come back to the emergency department if you develop fevers, chills, worsening of your pain, uncontrolled nausea, vomit, diarrhea as you will need further evaluation.

## 2021-07-11 NOTE — Discharge Instructions (Addendum)
Please report to the emergency room for a CT scan of abdomen pelvis to rule out an acute abdomen in the setting of a history of diverticulosis, multiple kidney stones.

## 2021-07-11 NOTE — ED Triage Notes (Signed)
Pt with right lower abd pain that radiates around back.  Urgent care sent here for a CT.

## 2021-07-11 NOTE — ED Triage Notes (Signed)
Pt is present today with right side pain that started four days ago. Pt states that she is nauseated denies any diarrhea and has regular constipation

## 2021-07-11 NOTE — ED Provider Notes (Signed)
Patient was received at shift change from [employer] she provided HPI, current work-up, likely disposition please see her note for full detail.  In short patient with significant medical history of anxiety, carpal tunnel, pneumonia, COPD, diabetes, presents with chief complaint of right-sided flank pain started about 4 days ago, pain is constant, denies any alleviating or aggravating factors.  She denies any urinary symptoms, denies vaginal discharge, vaginal bleeding, no history of ovarian torsion's or ovarian cysts.  Patient states that she has had a partial hysterectomy secondary due to cyst seen within her uterus.  Work-up-CBC is unremarkable, CMP unremarkable, lipase unremarkable, hCG negative, UA shows red blood cells, no nitrates, leukocytes, no bacteria, CT renal reveals fluid-filled slightly tubular structure in the right adnexal measuring 3.6 x 2.5 x 5.7 may be ovarian/bowel, differential diagnosis includes hydrosalpinx, cystic ovarian lesion and/or cystic lesion arising from the bowel.  No surrounding inflammatory changes present.  Diverticulosis without diverticulitis.  Patient was assessed, she has right lower quadrant tenderness, there is no guarding, rebound tenderness, peritoneal sign.  Due to abnormal findings will consult with OB/GYN, as well as general surgery for further recommendations.  Spoke with Dr. Vergie Living who reviewed patient's chart and imaging unclear etiology he recommends treating her as if she has PID, given her Rocephin, start her on Doxy as well as Flagyl for next 2 weeks.  [facility].  Spoke with Dr. Lovell Sheehan who feels that since the appendix is well visualized he has very low suspicion for appendicitis at this time.  Concern is that this is more secondary due to ovarian cysts recommend follow-up with OB/GYN with ultrasound for further evaluation.   Updated patient on recommendations from general surgery and OB/GYN she is  in agreement this plan, she like to go ahead and be treated with antibiotics, will have her follow-up with OB for transvaginal ultrasound.  Given strict return precautions.   Carroll Sage, PA-C 07/12/21 0016    Terrilee Files, MD 07/12/21 1101

## 2021-07-11 NOTE — ED Provider Notes (Signed)
[facility] EMERGENCY DEPARTMENT Provider Note   CSN: [id number] Arrival date & time: 07/11/21  1249     History Chief Complaint  Patient presents with   Abdominal Pain    Cassandra Lucas is a 45 y.o. female.  HPI  45 year old female with history of anemia, anxiety, arthritis, carpal tunnel syndrome, pneumonia, COPD, decreased libido, diabetes, dyslipidemia, fibromyalgia, empty sella, kidney stones, hyperlipidemia, who presents to the emergency department today for evaluation of right flank pain.  States she has had constant pain for the last 4 days.  Pain radiates to the abdomen.  Denies any exacerbating or alleviating factors.  She is had some nausea but no vomiting.  Denies any urinary symptoms or fevers.  She states she has had a history of over 40 kidney stones.  Past Medical History:  Diagnosis Date   Anemia    Anxiety    Arthritis    Carpal tunnel syndrome, bilateral 11/22/2015   Will try wrist splints   Community acquired pneumonia    COPD (chronic obstructive pulmonary disease) (HCC)    Decreased libido 03/26/2015   Diabetes mellitus without complication (HCC)    prediabetes   Dyslipidemia 11/24/2015   Empty sella (HCC)    Fibromyalgia    Fibromyalgia    Hot flashes 03/26/2015   Hyperlipidemia    Kidney stones    Productive cough    smoker   Renal disorder    Vitamin D deficiency 11/24/2015    Patient Active Problem List   Diagnosis Date Noted   Pain 04/11/2018   Weakness 04/11/2018   Fatigue 11/30/2016   Body aches 11/30/2016   Vitamin D deficiency 11/24/2015   Dyslipidemia 11/24/2015   Carpal tunnel syndrome, bilateral 11/22/2015   Decreased libido 03/26/2015   Hot flashes 03/26/2015   Kidney stones 04/11/2011    Class: Chronic   Menometrorrhagia 04/11/2011    Class: Chronic   Dyspareunia (not due to a general medical condition) 04/11/2011    Class: Chronic    Past Surgical History:  Procedure Laterality Date   ABDOMINAL HYSTERECTOMY     has  ovaries.    BREAST CYST EXCISION     CESAREAN SECTION  2006   [facility]   LAPAROSCOPIC CHOLECYSTECTOMY  2002   [facility]   LAPAROSCOPIC HYSTERECTOMY  04/12/2011   Procedure: HYSTERECTOMY TOTAL LAPAROSCOPIC;  Surgeon: Lazaro Arms, MD;  Location: [facility];  Service: Gynecology;  Laterality: N/A;   THYROIDECTOMY, PARTIAL  2012   [facility], [facility]     OB History     Gravida  4   Para  4   Term  0   Preterm  0   AB  0   Living  4      SAB  0   IAB  0   Ectopic  0   Multiple  0   Live Births              Family History  Problem Relation Age of Onset   Breast cancer Mother    Diabetes Father    Heart failure Father    Migraines Sister    Scoliosis Daughter    ADD / ADHD Son    Cancer Maternal Grandfather        pancreatic   Diabetes Paternal Grandmother    Congestive Heart Failure Paternal Grandmother    ADD / ADHD Daughter    Scoliosis Daughter    Hyperlipidemia Daughter    Diabetes Daughter  prediabetes   Bipolar disorder Son    ADD / ADHD Son     Social History   Tobacco Use   Smoking status: Every Day    Packs/day: 0.50    Years: 17.00    Pack years: 8.50    Types: Cigarettes   Smokeless tobacco: Never  Vaping Use   Vaping Use: Former  Substance Use Topics   Alcohol use: No   Drug use: No    Home Medications Prior to Admission medications   Medication Sig Start Date End Date Taking? Authorizing Provider  acetaminophen (TYLENOL) 500 MG tablet Take 1,000 mg by mouth daily as needed for mild pain.     [provider]  albuterol (PROVENTIL HFA;VENTOLIN HFA) 108 (90 BASE) MCG/ACT inhaler Inhale 2 puffs into the lungs every 6 (six) hours as needed for wheezing or shortness of breath.    [provider]  albuterol (PROVENTIL) (2.5 MG/3ML) 0.083% nebulizer solution Take 2.5 mg by nebulization every 6 (six) hours as needed for wheezing or shortness of breath.    [provider]  Cholecalciferol (VITAMIN D) 2000 units CAPS Take 1  capsule by mouth daily.    [provider]  Cholecalciferol 5000 units capsule Take 1 capsule (5,000 Units total) by mouth daily. 11/24/15   Adline Potter, NP  DULoxetine (CYMBALTA) 60 MG capsule Take 1 capsule (60 mg total) by mouth daily. 03/26/18   Aliene Beams, MD  gabapentin (NEURONTIN) 300 MG capsule Take 2 capsules (600 mg total) by mouth 3 (three) times daily. 04/11/18   Levert Feinstein, MD  ipratropium (ATROVENT HFA) 17 MCG/ACT inhaler Inhale 1 puff into the lungs 4 (four) times daily.    [provider]  lisinopril (PRINIVIL,ZESTRIL) 10 MG tablet Take 1 tablet (10 mg total) by mouth daily. 03/26/18   Aliene Beams, MD  metFORMIN (GLUCOPHAGE) 500 MG tablet Take 1 tablet (500 mg total) by mouth 2 (two) times daily with a meal. 02/05/18   Aliene Beams, MD  methotrexate (50 MG/ML) 1 g injection Inject into the vein once a week.    [provider]  Multiple Vitamin (MULTI-DAY VITAMINS PO) Take 1 tablet by mouth daily.    [provider]  Multiple Vitamins-Minerals (MULTIPLE VITAMINS/WOMENS PO) Take 1 tablet by mouth daily.    [provider]  oxyCODONE (OXY IR/ROXICODONE) 5 MG immediate release tablet Take 5 mg by mouth every 6 (six) hours as needed. pain    [provider]  pantoprazole (PROTONIX) 20 MG tablet Take 20 mg by mouth daily.    [provider]  predniSONE (DELTASONE) 20 MG tablet Take 2 tablets (40 mg total) by mouth daily with breakfast. 05/15/21   Bing Neighbors, FNP  Cimetidine (HEARTBURN RELIEF PO) Take 1 tablet by mouth daily as needed (heartburn).  05/31/20  [provider]    Allergies    Nsaids, Aspirin, and Macrobid [nitrofurantoin macrocrystal]  Review of Systems   Review of Systems  Constitutional:  Negative for chills and fever.  HENT:  Negative for ear pain and sore throat.   Eyes:  Negative for visual disturbance.  Respiratory:  Negative for cough and shortness of breath.   Cardiovascular:   Negative for chest pain.  Gastrointestinal:  Positive for abdominal pain and nausea. Negative for diarrhea and vomiting.  Genitourinary:  Positive for flank pain. Negative for dysuria, frequency and hematuria.  Musculoskeletal:  Negative for back pain.  Skin:  Negative for color change and rash.  Neurological:  Negative for headaches.  All other systems reviewed and are negative.  Physical Exam Updated Vital Signs BP (!) 128/92 (BP Location: Right Arm)   Pulse 100   Temp 98.1 F (36.7 C)   Resp 17   Ht 4\' 9"  (1.448 m)   Wt 83.9 kg   LMP 01/31/2011   SpO2 100%   BMI 40.03 kg/m   Physical Exam Vitals and nursing note reviewed.  Constitutional:      General: She is not in acute distress.    Appearance: She is well-developed.  HENT:     Head: Normocephalic and atraumatic.  Eyes:     Conjunctiva/sclera: Conjunctivae normal.  Cardiovascular:     Rate and Rhythm: Normal rate and regular rhythm.     Heart sounds: No murmur heard. Pulmonary:     Effort: Pulmonary effort is normal. No respiratory distress.     Breath sounds: Normal breath sounds. No wheezing, rhonchi or rales.  Abdominal:     General: Bowel sounds are normal.     Palpations: Abdomen is soft.     Tenderness: There is abdominal tenderness. There is right CVA tenderness. There is no guarding or rebound.  Musculoskeletal:     Cervical back: Neck supple.  Skin:    General: Skin is warm and dry.  Neurological:     Mental Status: She is alert.    ED Results / Procedures / Treatments   Labs (all labs ordered are listed, but only abnormal results are displayed) Labs Reviewed  COMPREHENSIVE METABOLIC PANEL - Abnormal; Notable for the following components:      Result Value   Glucose, Bld 102 (*)    All other components within normal limits  URINALYSIS, ROUTINE W REFLEX MICROSCOPIC - Abnormal; Notable for the following components:   Hgb urine dipstick SMALL (*)    All other components within normal limits  CBC  WITH DIFFERENTIAL/PLATELET  LIPASE, BLOOD  HCG, SERUM, QUALITATIVE    EKG None  Radiology No results found.  Procedures Procedures   Medications Ordered in ED Medications  ondansetron (ZOFRAN-ODT) disintegrating tablet 4 mg (4 mg Oral Given 07/11/21 1712)    ED Course  I have reviewed the triage vital signs and the nursing notes.  Pertinent labs & imaging results that were available during my care of the patient were reviewed by me and considered in my medical decision making (see chart for details).    MDM Rules/Calculators/A&P                          45 y/o female presents for eval of right cva pain x4 days. Hx nephrolithiasis  Reviewed/interpreted labs CBC unremarkable CMP unremarkable Lipase neg Hcg neg UA with hematuria w/o evidence of UTI  Reviewed/interpreted imaging CT renal - pending at discharge  Care transitioned to 55, PA-C. Plan to f/u on pending imaging   Final Clinical Impression(s) / ED Diagnoses Final diagnoses:  None    Rx / DC Orders ED Discharge Orders     None        Berle Mull 07/11/21 1915    07/13/21, MD 07/12/21 1059

## 2021-07-13 LAB — GC/CHLAMYDIA PROBE AMP (~~LOC~~) NOT AT ARMC
Chlamydia: NEGATIVE
Comment: NEGATIVE
Comment: NORMAL
Neisseria Gonorrhea: NEGATIVE

## 2021-07-28 ENCOUNTER — Encounter: Payer: Self-pay | Admitting: Obstetrics & Gynecology

## 2021-07-28 ENCOUNTER — Other Ambulatory Visit: Payer: Self-pay

## 2021-07-28 ENCOUNTER — Ambulatory Visit: Payer: Medicaid Other | Admitting: Obstetrics & Gynecology

## 2021-07-28 VITALS — BP 117/75 | HR 91 | Ht <= 58 in | Wt 182.8 lb

## 2021-07-28 DIAGNOSIS — N9489 Other specified conditions associated with female genital organs and menstrual cycle: Secondary | ICD-10-CM

## 2021-07-28 DIAGNOSIS — R1010 Upper abdominal pain, unspecified: Secondary | ICD-10-CM

## 2021-07-28 NOTE — Progress Notes (Signed)
   GYN VISIT Patient name: Cassandra Lucas MRN [id number]  Date of birth: 12-30-1975 Chief Complaint:   Ovarian Cyst  History of Present Illness:   Cassandra Lucas is a 45 y.o. G29P0004 PH female being seen today for ER follow up due to CT findings.     She reports that several weeks ago, she noted pain on her right side- radiated to her back/around to her front- couldn't tell where the pain was coming from.  Constant pain, 9/10.  Already takes pain medication and noted intense pain despite her regular medication.  No nausea/vomiting. Not sexually active.   Pt seen in ED, imaging completed and pt given antibiotics x2, caused some GI upset.  Then seen by PCP, started short course of steroids. Steroids seemed to improve her symptoms; however, now that she has stopped the steroids pain has returned, but not as significant.  Pain 5/10, points to mid right side.    Records reviewed- Renal CT 07/11/21- Normal left adnexa. Right adnexa with slight tubular appearance 3.6x2.5x5.7cm.  Patient's last menstrual period was 01/31/2011.   Review of Systems:   Pertinent items are noted in HPI Denies fever/chills, dizziness, headaches, visual disturbances, fatigue, shortness of breath, chest pain, vomiting, bowel movements, urination. Pertinent History Reviewed:  Reviewed past medical,surgical, social, obstetrical and family history.  Reviewed problem list, medications and allergies. Physical Assessment:   Vitals:   07/28/21 0836  BP: 117/75  Pulse: 91  Weight: 182 lb 12.8 oz (82.9 kg)  Height: 4\' 9"  (1.448 m)  Body mass index is 39.56 kg/m.       Physical Examination:   General appearance: alert, well appearing, and in no distress  Psych: mood appropriate, normal affect  Skin: warm & dry   Cardiovascular: normal heart rate noted  Respiratory: normal respiratory effort, no distress  Abdomen: soft, non-tender, no rebound, no guarding, no pelvic pain appreciated.  Back: +right CVA  tenderness/side pain  Pelvic: exam declined  Extremities: no edema, no calf tenderness bilaterally  Chaperone: N/A    Assessment & Plan:  1) Right sided pain/Adnexal abnormality -Reviewed imaging, based on clinical findings, do not suspect pain is gynecologic in nature -Plan for pelvic for further evaluation of adnexal concern -CT also showed diverticulosis- reviewed conservative treatment options and encouraged pt to f/u either with PCP or consider referral to GI -Questions and concerns were addressed   Return for next available GYN [place] (abd/pelvic).   [place], DO Attending Obstetrician & Gynecologist, [facility] for [facility]

## 2021-07-31 ENCOUNTER — Ambulatory Visit
Admission: EM | Admit: 2021-07-31 | Discharge: 2021-07-31 | Disposition: A | Discharge status: Home / Self Care | Payer: Medicaid Other | Attending: Family Medicine | Admitting: Family Medicine

## 2021-07-31 ENCOUNTER — Ambulatory Visit (INDEPENDENT_AMBULATORY_CARE_PROVIDER_SITE_OTHER): Payer: Medicaid Other

## 2021-07-31 ENCOUNTER — Encounter: Payer: Self-pay | Admitting: Emergency Medicine

## 2021-07-31 ENCOUNTER — Other Ambulatory Visit: Payer: Self-pay

## 2021-07-31 DIAGNOSIS — J45909 Unspecified asthma, uncomplicated: Secondary | ICD-10-CM

## 2021-07-31 DIAGNOSIS — J189 Pneumonia, unspecified organism: Secondary | ICD-10-CM | POA: Diagnosis not present

## 2021-07-31 DIAGNOSIS — R0602 Shortness of breath: Secondary | ICD-10-CM | POA: Diagnosis not present

## 2021-07-31 DIAGNOSIS — J3089 Other allergic rhinitis: Secondary | ICD-10-CM

## 2021-07-31 DIAGNOSIS — R059 Cough, unspecified: Secondary | ICD-10-CM | POA: Diagnosis not present

## 2021-07-31 DIAGNOSIS — J4541 Moderate persistent asthma with (acute) exacerbation: Secondary | ICD-10-CM

## 2021-07-31 HISTORY — DX: Unspecified asthma, uncomplicated: J45.909

## 2021-07-31 MED ORDER — DOXYCYCLINE HYCLATE 100 MG PO CAPS: 100.0000 mg | ORAL_CAPSULE | Freq: Two times a day (BID) | ORAL | 0 refills | Status: DC

## 2021-07-31 MED ORDER — PREDNISONE 10 MG PO TABS: ORAL_TABLET | ORAL | 0 refills | Status: DC

## 2021-07-31 MED ORDER — CETIRIZINE HCL 10 MG PO TABS: 10.0000 mg | ORAL_TABLET | Freq: Every day | ORAL | 2 refills | Status: AC

## 2021-07-31 MED ORDER — FLUTICASONE PROPIONATE 50 MCG/ACT NA SUSP: 1.0000 | Freq: Two times a day (BID) | NASAL | 2 refills | Status: DC

## 2021-07-31 MED ORDER — ALBUTEROL SULFATE HFA 108 (90 BASE) MCG/ACT IN AERS
1.0000 | INHALATION_SPRAY | Freq: Four times a day (QID) | RESPIRATORY_TRACT | 0 refills | Status: AC | PRN
Start: 2021-07-31 — End: ?

## 2021-07-31 NOTE — ED Provider Notes (Signed)
[facility]    CSN: [id number] Arrival date & time: 07/31/21  1436      History   Chief Complaint Chief Complaint  Patient presents with   Shortness of Breath   Cough   Nasal Congestion    HPI Cassandra Lucas is a 45 y.o. female.   Presenting today with 4 or 5-day history of progressively worsening shortness of breath, productive cough, congestion, chest tightness, wheezing.  Denies fever, chills, body aches, abdominal pain, nausea vomiting or diarrhea.  States she has a history of asthma and COPD as well as seasonal allergies and frequently gets pneumonia when these flare.  Was put on a short burst of prednisone through PCP at onset of symptoms but states this did nothing for her and that she usually requires a significantly larger taper of prednisone.  Has not been taking anything for her allergies this season.  Has been using her nebulizer machine with albuterol solution frequently with only short bursts of improvement. Taking Mucinex, cough syrups with minimal relief.   Past Medical History:  Diagnosis Date   Anemia    Anxiety    Arthritis    Asthma    Carpal tunnel syndrome, bilateral 11/22/2015   Will try wrist splints   Community acquired pneumonia    COPD (chronic obstructive pulmonary disease) (HCC)    Decreased libido 03/26/2015   Diabetes mellitus without complication (HCC)    prediabetes   Dyslipidemia 11/24/2015   Empty sella (HCC)    Fibromyalgia    Fibromyalgia    Hot flashes 03/26/2015   Hyperlipidemia    Kidney stones    Productive cough    smoker   Renal disorder    Vitamin D deficiency 11/24/2015    Patient Active Problem List   Diagnosis Date Noted   Asthma 07/31/2021   Pain 04/11/2018   Weakness 04/11/2018   Fatigue 11/30/2016   Body aches 11/30/2016   Vitamin D deficiency 11/24/2015   Dyslipidemia 11/24/2015   Carpal tunnel syndrome, bilateral 11/22/2015   Decreased libido 03/26/2015   Hot flashes 03/26/2015   Kidney  stones 04/11/2011    Class: Chronic   Menometrorrhagia 04/11/2011    Class: Chronic   Dyspareunia (not due to a general medical condition) 04/11/2011    Class: Chronic    Past Surgical History:  Procedure Laterality Date   ABDOMINAL HYSTERECTOMY     has ovaries.    BREAST CYST EXCISION     CESAREAN SECTION  2006   [facility]   LAPAROSCOPIC CHOLECYSTECTOMY  2002   [facility]   LAPAROSCOPIC HYSTERECTOMY  04/12/2011   Procedure: HYSTERECTOMY TOTAL LAPAROSCOPIC;  Surgeon: Lazaro Arms, MD;  Location: [facility];  Service: Gynecology;  Laterality: N/A;   THYROIDECTOMY, PARTIAL  2012   [facility], [facility]    OB History     Gravida  4   Para  4   Term  0   Preterm  0   AB  0   Living  4      SAB  0   IAB  0   Ectopic  0   Multiple  0   Live Births               Home Medications    Prior to Admission medications   Medication Sig Start Date End Date Taking? Authorizing Provider  albuterol (PROVENTIL) (2.5 MG/3ML) 0.083% nebulizer solution Take 2.5 mg by nebulization every 6 (six) hours as needed for wheezing or shortness of breath.  Yes [provider]  albuterol (VENTOLIN HFA) 108 (90 Base) MCG/ACT inhaler Inhale 1-2 puffs into the lungs every 6 (six) hours as needed for wheezing or shortness of breath. 07/31/21  Yes Particia Nearing, PA-C  cetirizine (ZYRTEC ALLERGY) 10 MG tablet Take 1 tablet (10 mg total) by mouth daily. 07/31/21  Yes Particia Nearing, PA-C  doxycycline (VIBRAMYCIN) 100 MG capsule Take 1 capsule (100 mg total) by mouth 2 (two) times daily. 07/31/21  Yes Particia Nearing, PA-C  fluticasone [facility]) 50 MCG/ACT nasal spray Place 1 spray into both nostrils 2 (two) times daily. 07/31/21  Yes Particia Nearing, PA-C  predniSONE (DELTASONE) 10 MG tablet Take 6 tabs daily x 2 days, 5 tabs daily x 2 days, 4 tabs daily x 2 days, etc 07/31/21  Yes Particia Nearing, PA-C  acetaminophen (TYLENOL) 500 MG tablet Take 1,000 mg by mouth daily  as needed for mild pain.     [provider]  BUTRANS 15 MCG/HR 1 patch once a week. 07/03/21   [provider]  Cholecalciferol (VITAMIN D) 2000 units CAPS Take 1 capsule by mouth daily.    [provider]  Cholecalciferol 5000 units capsule Take 1 capsule (5,000 Units total) by mouth daily. 11/24/15   Adline Potter, NP  cyclobenzaprine (FLEXERIL) 10 MG tablet Take 10 mg by mouth 3 (three) times daily as needed. 07/22/21   [provider]  folic acid (FOLVITE) 1 MG tablet Take 1 tablet by mouth daily. 07/09/19   [provider]  methotrexate (50 MG/ML) 1 g injection Inject into the vein once a week.    [provider]  oxyCODONE (OXY IR/ROXICODONE) 5 MG immediate release tablet Take 7.5 mg by mouth every 6 (six) hours as needed. pain    [provider]  pantoprazole (PROTONIX) 20 MG tablet Take 20 mg by mouth daily.    [provider]  pregabalin (LYRICA) 150 MG capsule Take 150 mg by mouth 3 (three) times daily. 07/01/21   [provider]  simvastatin (ZOCOR) 10 MG tablet Take 10 mg by mouth at bedtime. 06/11/21   [provider]  TRULICITY 0.75 MG/0.5ML SOPN SMARTSIG:Milligram(s) SUB-Q Once a Week 07/01/21   [provider]  venlafaxine XR (EFFEXOR-XR) 37.5 MG 24 hr capsule Take 37.5 mg by mouth daily. 07/15/21   [provider]  Cimetidine (HEARTBURN RELIEF PO) Take 1 tablet by mouth daily as needed (heartburn).  05/31/20  [provider]    Family History Family History  Problem Relation Age of Onset   Breast cancer Mother    Diabetes Father    Heart failure Father    Migraines Sister    Scoliosis Daughter    ADD / ADHD Son    Cancer Maternal Grandfather        pancreatic   Diabetes Paternal Grandmother    Congestive Heart Failure Paternal Grandmother    ADD / ADHD Daughter    Scoliosis Daughter    Hyperlipidemia Daughter    Diabetes Daughter        prediabetes    Bipolar disorder Son    ADD / ADHD Son     Social History Social History   Tobacco Use   Smoking status: Every Day    Packs/day: 0.50    Years: 17.00    Pack years: 8.50    Types: Cigarettes   Smokeless tobacco: Never  Vaping Use   Vaping Use: Former  Substance Use Topics  Alcohol use: No   Drug use: No     Allergies   Nsaids, Aspirin, and Macrobid [nitrofurantoin macrocrystal]   Review of Systems Review of Systems Per HPI  Physical Exam Triage Vital Signs ED Triage Vitals  Enc Vitals Group     BP 07/31/21 1453 (!) 155/97     Pulse Rate 07/31/21 1453 (!) 123     Resp 07/31/21 1453 (!) 24     Temp 07/31/21 1453 98 F (36.7 C)     Temp Source 07/31/21 1453 Oral     SpO2 07/31/21 1453 94 %     Weight --      Height --      Head Circumference --      Peak Flow --      Pain Score 07/31/21 1455 5     Pain Loc --      Pain Edu? --      Excl. in GC? --    No data found.  Updated Vital Signs BP (!) 155/97 (BP Location: Right Arm)   Pulse (!) 123   Temp 98 F (36.7 C) (Oral)   Resp (!) 24   LMP 01/31/2011   SpO2 94%   Visual Acuity Right Eye Distance:   Left Eye Distance:   Bilateral Distance:    Right Eye Near:   Left Eye Near:    Bilateral Near:     Physical Exam Vitals and nursing note reviewed.  Constitutional:      Appearance: Normal appearance. She is not ill-appearing.  HENT:     Head: Atraumatic.     Right Ear: Tympanic membrane normal.     Left Ear: Tympanic membrane normal.     Nose: Rhinorrhea present.     Mouth/Throat:     Mouth: Mucous membranes are moist.     Pharynx: Posterior oropharyngeal erythema present.  Eyes:     Extraocular Movements: Extraocular movements intact.     Conjunctiva/sclera: Conjunctivae normal.  Cardiovascular:     Rate and Rhythm: Normal rate and regular rhythm.     Heart sounds: Normal heart sounds.  Pulmonary:     Breath sounds: Wheezing and rales present.     Comments: Mildly increased labored  breathing.  Mild tachypnea.  Rales bilaterally, worse in the left lower.  Moderate to significant wheezes diffusely Musculoskeletal:        General: Normal range of motion.     Cervical back: Normal range of motion and neck supple.  Skin:    General: Skin is warm and dry.  Neurological:     Mental Status: She is alert and oriented to person, place, and time.  Psychiatric:        Mood and Affect: Mood normal.        Thought Content: Thought content normal.        Judgment: Judgment normal.     UC Treatments / Results  Labs (all labs ordered are listed, but only abnormal results are displayed) Labs Reviewed - No data to display  EKG   Radiology DG Chest 2 View  Result Date: 07/31/2021 CLINICAL DATA:  Shortness of breath, cough EXAM: CHEST - 2 VIEW COMPARISON:  03/30/2021 FINDINGS: Airspace opacity at the left lung base concerning for pneumonia. Right lung clear. Heart is normal size. No effusions or acute bony abnormality. IMPRESSION: Left lower lobe airspace opacity concerning for pneumonia. Electronically Signed   By: Charlett Nose M.D.   On: 07/31/2021 15:26    Procedures Procedures (including  critical care time)  Medications Ordered in UC Medications - No data to display  Initial Impression / Assessment and Plan / UC Course  I have reviewed the triage vital signs and the nursing notes.  Pertinent labs & imaging results that were available during my care of the patient were reviewed by me and considered in my medical decision making (see chart for details).     Tachycardic, tachypneic in triage and on exam.  X-ray confirming left lower lobe pneumonia in addition to asthma exacerbation.  We will treat with extended prednisone taper, doxycycline, albuterol, Phenergan DM.  Discussed supportive over-the-counter medications and home care.  Return for acutely worsening symptoms.  Final Clinical Impressions(s) / UC Diagnoses   Final diagnoses:  Pneumonia of left lower lobe due  to infectious organism  Moderate persistent asthma with acute exacerbation  Seasonal allergic rhinitis due to other allergic trigger   Discharge Instructions   None    ED Prescriptions     Medication Sig Dispense Auth. Provider   doxycycline (VIBRAMYCIN) 100 MG capsule Take 1 capsule (100 mg total) by mouth 2 (two) times daily. 14 capsule Particia Nearing, [place]   albuterol (VENTOLIN HFA) 108 (90 Base) MCG/ACT inhaler Inhale 1-2 puffs into the lungs every 6 (six) hours as needed for wheezing or shortness of breath. 18 g Particia Nearing, [place]   predniSONE (DELTASONE) 10 MG tablet Take 6 tabs daily x 2 days, 5 tabs daily x 2 days, 4 tabs daily x 2 days, etc 42 tablet Particia Nearing, PA-C   cetirizine (ZYRTEC ALLERGY) 10 MG tablet Take 1 tablet (10 mg total) by mouth daily. 30 tablet Particia Nearing, PA-C   fluticasone [facility]) 50 MCG/ACT nasal spray Place 1 spray into both nostrils 2 (two) times daily. 16 g Particia Nearing, [place]      PDMP not reviewed this encounter.   Particia Nearing, [place] 07/31/21 1635

## 2021-07-31 NOTE — ED Triage Notes (Signed)
Pt presents today with c/o of SOB, cough (prod/yellow) with nasal congestion x 4 days. Denies fever.

## 2021-08-17 ENCOUNTER — Other Ambulatory Visit: Payer: Self-pay | Admitting: Obstetrics & Gynecology

## 2021-08-17 DIAGNOSIS — R1031 Right lower quadrant pain: Secondary | ICD-10-CM

## 2021-08-18 ENCOUNTER — Other Ambulatory Visit: Payer: Self-pay

## 2021-08-18 ENCOUNTER — Ambulatory Visit (INDEPENDENT_AMBULATORY_CARE_PROVIDER_SITE_OTHER): Payer: Medicaid Other

## 2021-08-18 DIAGNOSIS — R1031 Right lower quadrant pain: Secondary | ICD-10-CM | POA: Diagnosis not present

## 2021-08-18 NOTE — Progress Notes (Signed)
PELVIC US TA/TV: S/P hysterectomy,multiple complex small cyst in vaginal cuff,largest cyst 1.5 x 1.1 x 1.7 cm,normal right ovary,simple tubular structure in right adnexa adjacent to right ovary 7.4 x 3 x 2.6 cm,normal left ovary,unable to slide left ovary with probe pressure,left adnexal pain during ultrasound  Chaperone Brandel

## 2021-08-23 ENCOUNTER — Other Ambulatory Visit: Payer: Medicaid Other

## 2021-08-24 ENCOUNTER — Other Ambulatory Visit: Payer: Medicaid Other

## 2021-08-26 ENCOUNTER — Telehealth: Payer: Self-pay | Admitting: Obstetrics & Gynecology

## 2021-08-26 NOTE — Telephone Encounter (Signed)
Called patient and reviewed [place] report.  She notes that during the exam, she did not have any tenderness on the right side where the abnormality was noted.  As mentioned previously, she is concerned that this is GI related as she had improvement following a course of ABX and steroids.  Briefly discussed pelvic infection- however she is s/p Doxy x 14 days, s/p hysterectomy and is not currently sexually active.  Reviewed that PID as part of the differential didn't truly fit.    For now plan to monitor symptoms- should she note worsening pain, nausea/vomiting, fever or other acute changes pt to go to urgent care/ER.  Otherwise, plan to f/u for annual or prn  Myna Hidalgo, DO Attending Obstetrician & Gynecologist, [facility] for [employer], [facility]

## 2021-09-09 ENCOUNTER — Encounter: Payer: Self-pay | Admitting: Emergency Medicine

## 2021-09-09 ENCOUNTER — Other Ambulatory Visit: Payer: Self-pay

## 2021-09-09 ENCOUNTER — Ambulatory Visit
Admission: EM | Admit: 2021-09-09 | Discharge: 2021-09-09 | Disposition: A | Discharge status: Home / Self Care | Payer: Medicaid Other | Attending: Physician Assistant | Admitting: Physician Assistant

## 2021-09-09 DIAGNOSIS — J440 Chronic obstructive pulmonary disease with acute lower respiratory infection: Secondary | ICD-10-CM | POA: Diagnosis not present

## 2021-09-09 MED ORDER — PREDNISONE 10 MG PO TABS: ORAL_TABLET | ORAL | 0 refills | Status: DC

## 2021-09-09 MED ORDER — AZITHROMYCIN 250 MG PO TABS: ORAL_TABLET | ORAL | 0 refills | Status: AC

## 2021-09-09 NOTE — ED Provider Notes (Addendum)
[facility]    CSN: [id number] Arrival date & time: 09/09/21  1600      History   Chief Complaint Chief Complaint  Patient presents with   Chest Congestion    Chest Pain    Upon inhalation     HPI Cassandra Lucas is a 45 y.o. female.   The history is provided by the patient. No language interpreter was used.  Chest Pain Pain location:  Epigastric Pain quality: aching   Pain radiates to:  Does not radiate Pain severity:  Moderate Onset quality:  Gradual Duration:  1 day Progression:  Worsening Chronicity:  New Relieved by:  Nothing Worsened by:  Nothing Ineffective treatments:  None tried Associated symptoms: no weakness    Past Medical History:  Diagnosis Date   Anemia    Anxiety    Arthritis    Asthma    Carpal tunnel syndrome, bilateral 11/22/2015   Will try wrist splints   Community acquired pneumonia    COPD (chronic obstructive pulmonary disease) (HCC)    Decreased libido 03/26/2015   Diabetes mellitus without complication (HCC)    prediabetes   Dyslipidemia 11/24/2015   Empty sella (HCC)    Fibromyalgia    Fibromyalgia    Hot flashes 03/26/2015   Hyperlipidemia    Kidney stones    Productive cough    smoker   Renal disorder    Vitamin D deficiency 11/24/2015    Patient Active Problem List   Diagnosis Date Noted   Asthma 07/31/2021   Pain 04/11/2018   Weakness 04/11/2018   Fatigue 11/30/2016   Body aches 11/30/2016   Vitamin D deficiency 11/24/2015   Dyslipidemia 11/24/2015   Carpal tunnel syndrome, bilateral 11/22/2015   Decreased libido 03/26/2015   Hot flashes 03/26/2015   Kidney stones 04/11/2011    Class: Chronic   Menometrorrhagia 04/11/2011    Class: Chronic   Dyspareunia (not due to a general medical condition) 04/11/2011    Class: Chronic    Past Surgical History:  Procedure Laterality Date   ABDOMINAL HYSTERECTOMY     has ovaries.    BREAST CYST EXCISION      CESAREAN SECTION  2006   [facility]   LAPAROSCOPIC CHOLECYSTECTOMY  2002   [facility]   LAPAROSCOPIC HYSTERECTOMY  04/12/2011   Procedure: HYSTERECTOMY TOTAL LAPAROSCOPIC;  Surgeon: Lazaro Arms, MD;  Location: [facility];  Service: Gynecology;  Laterality: N/A;   THYROIDECTOMY, PARTIAL  2012   [facility], [facility]    OB History     Gravida  4   Para  4   Term  0   Preterm  0   AB  0   Living  4      SAB  0   IAB  0   Ectopic  0   Multiple  0   Live Births               Home Medications    Prior to Admission medications   Medication Sig Start Date End Date Taking? Authorizing Provider  albuterol (PROVENTIL) (2.5 MG/3ML) 0.083% nebulizer solution Take 2.5 mg by nebulization every 6 (six) hours as needed for wheezing or shortness of breath.   Yes [provider]  azithromycin (ZITHROMAX Z-PAK) 250 MG tablet Take 2 tablets (500 mg) on  Day 1,  followed by 1 tablet (250 mg) once daily on Days 2 through 5. 09/09/21 09/14/21 Yes Elson Areas, PA-C  BUTRANS 15 MCG/HR 1 patch once a  week. 07/03/21  Yes [provider]  cetirizine (ZYRTEC ALLERGY) 10 MG tablet Take 1 tablet (10 mg total) by mouth daily. 07/31/21  Yes Particia Nearing, PA-C  Cholecalciferol (VITAMIN D) 2000 units CAPS Take 1 capsule by mouth daily.   Yes [provider]  Cholecalciferol 5000 units capsule Take 1 capsule (5,000 Units total) by mouth daily. 11/24/15  Yes Cyril Mourning A, NP  cyclobenzaprine (FLEXERIL) 10 MG tablet Take 10 mg by mouth 3 (three) times daily as needed. 07/22/21  Yes [provider]  fluticasone (FLONASE) 50 MCG/ACT nasal spray Place 1 spray into both nostrils 2 (two) times daily. 07/31/21  Yes Particia Nearing, PA-C  folic acid (FOLVITE) 1 MG tablet Take 1 tablet by mouth daily. 07/09/19  Yes [provider]  methotrexate (50 MG/ML) 1 g injection Inject into the vein once a week.   Yes [provider]  pantoprazole (PROTONIX) 20  MG tablet Take 20 mg by mouth daily.   Yes [provider]  predniSONE (DELTASONE) 10 MG tablet 6,5,4,3,2,1 taper 09/09/21  Yes Elson Areas, PA-C  pregabalin (LYRICA) 150 MG capsule Take 150 mg by mouth 3 (three) times daily. 07/01/21  Yes [provider]  simvastatin (ZOCOR) 10 MG tablet Take 10 mg by mouth at bedtime. 06/11/21  Yes [provider]  TRULICITY 0.75 MG/0.5ML SOPN SMARTSIG:Milligram(s) SUB-Q Once a Week 07/01/21  Yes [provider]  venlafaxine XR (EFFEXOR-XR) 37.5 MG 24 hr capsule Take 37.5 mg by mouth daily. 07/15/21  Yes [provider]  acetaminophen (TYLENOL) 500 MG tablet Take 1,000 mg by mouth daily as needed for mild pain.     [provider]  albuterol (VENTOLIN HFA) 108 (90 Base) MCG/ACT inhaler Inhale 1-2 puffs into the lungs every 6 (six) hours as needed for wheezing or shortness of breath. 07/31/21   Particia Nearing, PA-C  oxyCODONE (OXY IR/ROXICODONE) 5 MG immediate release tablet Take 7.5 mg by mouth every 6 (six) hours as needed. pain    [provider]  Cimetidine (HEARTBURN RELIEF PO) Take 1 tablet by mouth daily as needed (heartburn).  05/31/20  [provider]    Family History Family History  Problem Relation Age of Onset   Breast cancer Mother    Diabetes Father    Heart failure Father    Migraines Sister    Scoliosis Daughter    ADD / ADHD Son    Cancer Maternal Grandfather        pancreatic   Diabetes Paternal Grandmother    Congestive Heart Failure Paternal Grandmother    ADD / ADHD Daughter    Scoliosis Daughter    Hyperlipidemia Daughter    Diabetes Daughter        prediabetes   Bipolar disorder Son    ADD / ADHD Son     Social History Social History   Tobacco Use   Smoking status: Every Day    Packs/day: 0.50    Years: 17.00    Pack years: 8.50    Types: Cigarettes   Smokeless tobacco: Never  Vaping Use   Vaping Use: Former   Substance Use Topics   Alcohol use: No   Drug use: No     Allergies   Nsaids, Aspirin, and Macrobid [nitrofurantoin macrocrystal]   Review of Systems Review of Systems  Cardiovascular:  Positive for chest pain.  Neurological:  Negative for weakness.  All other systems reviewed and are negative.   Physical Exam  Triage Vital Signs ED Triage Vitals  Enc Vitals Group     BP 09/09/21 1613 114/80     Pulse Rate 09/09/21 1613 (!) 107     Resp 09/09/21 1613 18     Temp 09/09/21 1613 98.2 F (36.8 C)     Temp Source 09/09/21 1613 Oral     SpO2 09/09/21 1613 92 %     Weight --      Height --      Head Circumference --      Peak Flow --      Pain Score 09/09/21 1611 6     Pain Loc --      Pain Edu? --      Excl. in GC? --    No data found.  Updated Vital Signs BP 114/80 (BP Location: Right Arm)    Pulse (!) 107    Temp 98.2 F (36.8 C) (Oral)    Resp 18    LMP 01/31/2011    SpO2 92%   Visual Acuity Right Eye Distance:   Left Eye Distance:   Bilateral Distance:    Right Eye Near:   Left Eye Near:    Bilateral Near:     Physical Exam Vitals reviewed.  Constitutional:      Appearance: She is well-developed.  Cardiovascular:     Rate and Rhythm: Normal rate and regular rhythm.     Heart sounds: Normal heart sounds.  Pulmonary:     Effort: Pulmonary effort is normal.     Breath sounds: Normal breath sounds.  Musculoskeletal:        General: Normal range of motion.     Cervical back: Normal range of motion and neck supple.  Skin:    General: Skin is warm.  Neurological:     General: No focal deficit present.     Mental Status: She is alert.     UC Treatments / Results  Labs (all labs ordered are listed, but only abnormal results are displayed) Labs Reviewed - No data to display  EKG   Radiology No results found.  Procedures Procedures (including critical care time)  Medications Ordered in UC Medications - No data to display  Initial  Impression / Assessment and Plan / UC Course  I have reviewed the triage vital signs and the nursing notes.  Pertinent labs & imaging results that were available during my care of the patient were reviewed by me and considered in my medical decision making (see chart for details).     MDM:  Pt gets frequent pneumonia.  Pt request zithromax and prednisone  Final Clinical Impressions(s) / UC Diagnoses   Final diagnoses:  Bronchitis, chronic obstructive w acute bronchitis (HCC)     Discharge Instructions      Return if any problems.    ED Prescriptions     Medication Sig Dispense Auth. Provider   azithromycin (ZITHROMAX Z-PAK) 250 MG tablet Take 2 tablets (500 mg) on  Day 1,  followed by 1 tablet (250 mg) once daily on Days 2 through 5. 6 each Equilla Que K, PA-C   predniSONE (DELTASONE) 10 MG tablet 6,5,4,3,2,1 taper 30 tablet Elson Areas, [place]      PDMP not reviewed this encounter. An After Visit Summary was printed and given to the patient.    Elson Areas, PA-C 09/09/21 1958    Elson Areas, PA-C 09/16/21 1803    Elson Areas, [place] 09/26/21 1517

## 2021-09-09 NOTE — Discharge Instructions (Addendum)
Return if any problems.

## 2021-09-09 NOTE — ED Triage Notes (Signed)
Patient c/o chest congestion and Chest pain upon inhalation x 1 week ago.   Patient endorses SOB when laying down and upon exertion.   Patient denies fever at home.   Patient has taken Mucinex-DM and Nebulizer with no relief of symptoms.   History of Asthma and Pneumonia ("three weeks ago").

## 2021-10-09 ENCOUNTER — Ambulatory Visit
Admission: EM | Admit: 2021-10-09 | Discharge: 2021-10-09 | Payer: Medicaid Other | Attending: Urgent Care | Admitting: Urgent Care

## 2021-10-09 ENCOUNTER — Other Ambulatory Visit: Payer: Self-pay

## 2021-10-09 ENCOUNTER — Ambulatory Visit (INDEPENDENT_AMBULATORY_CARE_PROVIDER_SITE_OTHER): Payer: Medicaid Other

## 2021-10-09 ENCOUNTER — Emergency Department (HOSPITAL_COMMUNITY)
Admission: EM | Admit: 2021-10-09 | Discharge: 2021-10-09 | Disposition: A | Discharge status: Home / Self Care | Payer: Medicaid Other | Attending: Emergency Medicine | Admitting: Emergency Medicine

## 2021-10-09 ENCOUNTER — Encounter: Payer: Self-pay | Admitting: Emergency Medicine

## 2021-10-09 ENCOUNTER — Emergency Department (HOSPITAL_COMMUNITY): Payer: Medicaid Other

## 2021-10-09 DIAGNOSIS — R062 Wheezing: Secondary | ICD-10-CM

## 2021-10-09 DIAGNOSIS — J441 Chronic obstructive pulmonary disease with (acute) exacerbation: Secondary | ICD-10-CM | POA: Insufficient documentation

## 2021-10-09 DIAGNOSIS — R059 Cough, unspecified: Secondary | ICD-10-CM

## 2021-10-09 DIAGNOSIS — Z8616 Personal history of COVID-19: Secondary | ICD-10-CM | POA: Diagnosis not present

## 2021-10-09 DIAGNOSIS — J45909 Unspecified asthma, uncomplicated: Secondary | ICD-10-CM | POA: Diagnosis not present

## 2021-10-09 DIAGNOSIS — Z20822 Contact with and (suspected) exposure to covid-19: Secondary | ICD-10-CM | POA: Insufficient documentation

## 2021-10-09 DIAGNOSIS — R000 Tachycardia, unspecified: Secondary | ICD-10-CM | POA: Diagnosis not present

## 2021-10-09 LAB — CBC WITH DIFFERENTIAL/PLATELET
Abs Immature Granulocytes: 0.04 10*3/uL (ref 0.00–0.07)
Basophils Absolute: 0 10*3/uL (ref 0.0–0.1)
Basophils Relative: 0 %
Eosinophils Absolute: 0.2 10*3/uL (ref 0.0–0.5)
Eosinophils Relative: 1 %
HCT: 45 % (ref 36.0–46.0)
Hemoglobin: 15.2 g/dL — ABNORMAL HIGH (ref 12.0–15.0)
Immature Granulocytes: 0 %
Lymphocytes Relative: 20 %
Lymphs Abs: 2.1 10*3/uL (ref 0.7–4.0)
MCH: 32.8 pg (ref 26.0–34.0)
MCHC: 33.8 g/dL (ref 30.0–36.0)
MCV: 97.2 fL (ref 80.0–100.0)
Monocytes Absolute: 0.6 10*3/uL (ref 0.1–1.0)
Monocytes Relative: 6 %
Neutro Abs: 7.4 10*3/uL (ref 1.7–7.7)
Neutrophils Relative %: 73 %
Platelets: 208 10*3/uL (ref 150–400)
RBC: 4.63 MIL/uL (ref 3.87–5.11)
RDW: 13.6 % (ref 11.5–15.5)
WBC: 10.4 10*3/uL (ref 4.0–10.5)
nRBC: 0 % (ref 0.0–0.2)

## 2021-10-09 LAB — COMPREHENSIVE METABOLIC PANEL
ALT: 28 U/L (ref 0–44)
AST: 22 U/L (ref 15–41)
Albumin: 4.1 g/dL (ref 3.5–5.0)
Alkaline Phosphatase: 78 U/L (ref 38–126)
Anion gap: 8 (ref 5–15)
BUN: 10 mg/dL (ref 6–20)
CO2: 26 mmol/L (ref 22–32)
Calcium: 8.9 mg/dL (ref 8.9–10.3)
Chloride: 100 mmol/L (ref 98–111)
Creatinine, Ser: 0.66 mg/dL (ref 0.44–1.00)
GFR, Estimated: >60 mL/min (ref >60)
Glucose, Bld: 116 mg/dL — ABNORMAL HIGH (ref 70–99)
Potassium: 3.5 mmol/L (ref 3.5–5.1)
Sodium: 134 mmol/L — ABNORMAL LOW (ref 135–145)
Total Bilirubin: 0.6 mg/dL (ref 0.3–1.2)
Total Protein: 7.2 g/dL (ref 6.5–8.1)

## 2021-10-09 LAB — RESP PANEL BY RT-PCR (FLU A&B, COVID) ARPGX2
Influenza A by PCR: NEGATIVE
Influenza B by PCR: NEGATIVE
SARS Coronavirus 2 by RT PCR: NEGATIVE

## 2021-10-09 LAB — LACTIC ACID, PLASMA: Lactic Acid, Venous: 1.4 mmol/L (ref 0.5–1.9)

## 2021-10-09 MED ORDER — METHYLPREDNISOLONE SODIUM SUCC 125 MG IJ SOLR
125.0000 mg | Freq: Once | INTRAMUSCULAR | Status: AC
  Administered 2021-10-09: 125 mg via INTRAVENOUS
  Filled 2021-10-09: qty 2

## 2021-10-09 MED ORDER — DOXYCYCLINE HYCLATE 100 MG PO CAPS
100.0000 mg | ORAL_CAPSULE | Freq: Two times a day (BID) | ORAL | 0 refills | Status: DC
Start: 2021-10-09 — End: 2023-11-28

## 2021-10-09 MED ORDER — IOHEXOL 350 MG/ML SOLN
75.0000 mL | Freq: Once | INTRAVENOUS | Status: AC | PRN
  Administered 2021-10-09: 75 mL via INTRAVENOUS

## 2021-10-09 MED ORDER — PREDNISONE 10 MG (21) PO TBPK: ORAL_TABLET | Freq: Every day | ORAL | 0 refills | Status: DC

## 2021-10-09 MED ORDER — IPRATROPIUM-ALBUTEROL 0.5-2.5 (3) MG/3ML IN SOLN
3.0000 mL | Freq: Once | RESPIRATORY_TRACT | Status: AC
  Administered 2021-10-09: 3 mL via RESPIRATORY_TRACT
  Filled 2021-10-09: qty 3

## 2021-10-09 MED ORDER — ALBUTEROL SULFATE (2.5 MG/3ML) 0.083% IN NEBU
INHALATION_SOLUTION | RESPIRATORY_TRACT | Status: AC
  Administered 2021-10-09: 2.5 mg
  Filled 2021-10-09: qty 3

## 2021-10-09 MED ORDER — SODIUM CHLORIDE 0.9 % IV BOLUS
1000.0000 mL | Freq: Once | INTRAVENOUS | Status: AC
  Administered 2021-10-09: 1000 mL via INTRAVENOUS

## 2021-10-09 NOTE — ED Notes (Signed)
Ambulated in hallway, oxygen saturation of 94-98%, hr was 138

## 2021-10-09 NOTE — ED Notes (Signed)
Pt care taken, no complaints at this time. 

## 2021-10-09 NOTE — ED Notes (Signed)
States she had covid a month ago and has continued to have breathing issues since. Urgent care sent her here for possible admission

## 2021-10-09 NOTE — ED Triage Notes (Signed)
Referral from urgent care for treatment of cough with shortness of breath

## 2021-10-09 NOTE — ED Notes (Signed)
Lungs have inspiratory wheezes which are very faint greater on left. X-ray hyper expanded but clear. Hx of asthma still smokes. Asthma flare????

## 2021-10-09 NOTE — Discharge Instructions (Addendum)
Take doxycycline as prescribed and complete the full course.  Take prednisone as prescribed and complete the full course.  Follow-up with your primary care provider for recheck this week.  Return to emergency room for worsening or concerning symptoms.

## 2021-10-09 NOTE — ED Triage Notes (Signed)
Cough, wheezing, body aches, x 1 week.  Productive cough with green sputum

## 2021-10-09 NOTE — ED Provider Notes (Signed)
[facility]   MRN: [id number] DOB: 1976/01/21  Subjective:   Cassandra Lucas is a 46 y.o. female presenting for 1 week history of persistent and worsening coughing, wheezing, shortness of breath, body aches.  She has had a productive cough as well.  Has a history of asthma, COPD.  She has been using her breathing treatments with very short term relief.  She is still smoking as well, does 1/2ppd.  No current facility-administered medications for this encounter.  Current Outpatient Medications:    acetaminophen (TYLENOL) 500 MG tablet, Take 1,000 mg by mouth daily as needed for mild pain. , Disp: , Rfl:    albuterol (PROVENTIL) (2.5 MG/3ML) 0.083% nebulizer solution, Take 2.5 mg by nebulization every 6 (six) hours as needed for wheezing or shortness of breath., Disp: , Rfl:    albuterol (VENTOLIN HFA) 108 (90 Base) MCG/ACT inhaler, Inhale 1-2 puffs into the lungs every 6 (six) hours as needed for wheezing or shortness of breath., Disp: 18 g, Rfl: 0   BUTRANS 15 MCG/HR, 1 patch once a week., Disp: , Rfl:    cetirizine (ZYRTEC ALLERGY) 10 MG tablet, Take 1 tablet (10 mg total) by mouth daily., Disp: 30 tablet, Rfl: 2   Cholecalciferol (VITAMIN D) 2000 units CAPS, Take 1 capsule by mouth daily., Disp: , Rfl:    Cholecalciferol 5000 units capsule, Take 1 capsule (5,000 Units total) by mouth daily., Disp: , Rfl:    cyclobenzaprine (FLEXERIL) 10 MG tablet, Take 10 mg by mouth 3 (three) times daily as needed., Disp: , Rfl:    fluticasone (FLONASE) 50 MCG/ACT nasal spray, Place 1 spray into both nostrils 2 (two) times daily., Disp: 16 g, Rfl: 2   folic acid (FOLVITE) 1 MG tablet, Take 1 tablet by mouth daily., Disp: , Rfl:    methotrexate (50 MG/ML) 1 g injection, Inject into the vein once a week., Disp: , Rfl:    oxyCODONE (OXY IR/ROXICODONE) 5 MG immediate release tablet, Take 7.5 mg by mouth every 6 (six) hours as needed. pain, Disp: , Rfl:    pantoprazole (PROTONIX) 20 MG  tablet, Take 20 mg by mouth daily., Disp: , Rfl:    predniSONE (DELTASONE) 10 MG tablet, 6,5,4,3,2,1 taper, Disp: 30 tablet, Rfl: 0   pregabalin (LYRICA) 150 MG capsule, Take 150 mg by mouth 3 (three) times daily., Disp: , Rfl:    simvastatin (ZOCOR) 10 MG tablet, Take 10 mg by mouth at bedtime., Disp: , Rfl:    TRULICITY 0.75 MG/0.5ML SOPN, SMARTSIG:Milligram(s) SUB-Q Once a Week, Disp: , Rfl:    venlafaxine XR (EFFEXOR-XR) 37.5 MG 24 hr capsule, Take 37.5 mg by mouth daily., Disp: , Rfl:    Allergies  Allergen Reactions   Nsaids Shortness Of Breath   Aspirin Other (See Comments)    Unknown childhood reaction   Macrobid [Nitrofurantoin Macrocrystal] Nausea And Vomiting    Past Medical History:  Diagnosis Date   Anemia    Anxiety    Arthritis    Asthma    Carpal tunnel syndrome, bilateral 11/22/2015   Will try wrist splints   Community acquired pneumonia    COPD (chronic obstructive pulmonary disease) (HCC)    Decreased libido 03/26/2015   Diabetes mellitus without complication (HCC)    prediabetes   Dyslipidemia 11/24/2015   Empty sella (HCC)    Fibromyalgia    Fibromyalgia    Hot flashes 03/26/2015   Hyperlipidemia    Kidney stones    Productive cough  smoker   Renal disorder    Vitamin D deficiency 11/24/2015     Past Surgical History:  Procedure Laterality Date   ABDOMINAL HYSTERECTOMY     has ovaries.    BREAST CYST EXCISION     CESAREAN SECTION  2006   [facility]   LAPAROSCOPIC CHOLECYSTECTOMY  2002   [facility]   LAPAROSCOPIC HYSTERECTOMY  04/12/2011   Procedure: HYSTERECTOMY TOTAL LAPAROSCOPIC;  Surgeon: Lazaro Arms, MD;  Location: [facility];  Service: Gynecology;  Laterality: N/A;   THYROIDECTOMY, PARTIAL  2012   [facility], [facility]    Family History  Problem Relation Age of Onset   Breast cancer Mother    Diabetes Father    Heart failure Father    Migraines Sister    Scoliosis Daughter    ADD / ADHD Son    Cancer Maternal Grandfather        pancreatic   Diabetes  Paternal Grandmother    Congestive Heart Failure Paternal Grandmother    ADD / ADHD Daughter    Scoliosis Daughter    Hyperlipidemia Daughter    Diabetes Daughter        prediabetes   Bipolar disorder Son    ADD / ADHD Son     Social History   Tobacco Use   Smoking status: Every Day    Packs/day: 0.50    Years: 17.00    Pack years: 8.50    Types: Cigarettes   Smokeless tobacco: Never  Vaping Use   Vaping Use: Former  Substance Use Topics   Alcohol use: No   Drug use: No    ROS   Objective:   Vitals: BP 128/84 (BP Location: Right Arm)    Pulse (!) 135    Temp 98.3 F (36.8 C) (Temporal)    Resp 18    LMP 01/31/2011    SpO2 91%   Wt Readings from Last 3 Encounters:  07/28/21 182 lb 12.8 oz (82.9 kg)  07/11/21 185 lb (83.9 kg)  05/31/20 205 lb (93 kg)   Temp Readings from Last 3 Encounters:  10/09/21 98.3 F (36.8 C) (Temporal)  09/09/21 98.2 F (36.8 C) (Oral)  07/31/21 98 F (36.7 C) (Oral)   BP Readings from Last 3 Encounters:  10/09/21 128/84  09/09/21 114/80  07/31/21 (!) 155/97   Pulse Readings from Last 3 Encounters:  10/09/21 (!) 135  09/09/21 (!) 107  07/31/21 (!) 123   Physical Exam Constitutional:      General: She is not in acute distress.    Appearance: Normal appearance. She is well-developed. She is not ill-appearing, toxic-appearing or diaphoretic.  HENT:     Head: Normocephalic and atraumatic.     Nose: Nose normal.     Mouth/Throat:     Mouth: Mucous membranes are moist.  Eyes:     General: No scleral icterus.       Right eye: No discharge.        Left eye: No discharge.     Extraocular Movements: Extraocular movements intact.  Cardiovascular:     Rate and Rhythm: Normal rate.  Pulmonary:     Effort: Tachypnea and accessory muscle usage present. No respiratory distress.     Breath sounds: No stridor. Examination of the right-upper field reveals rhonchi. Examination of the left-upper field reveals rhonchi. Examination of the  right-middle field reveals rhonchi. Examination of the left-middle field reveals rhonchi. Examination of the right-lower field reveals rhonchi. Examination of the left-lower field reveals rhonchi. Wheezing and  rhonchi present. No rales.  Chest:     Chest wall: No tenderness.  Skin:    General: Skin is warm and dry.  Neurological:     General: No focal deficit present.     Mental Status: She is alert and oriented to person, place, and time.  Psychiatric:        Mood and Affect: Mood normal.        Behavior: Behavior normal.    DG Chest 2 View  Result Date: 10/09/2021 CLINICAL DATA:  Cough and wheezing. EXAM: CHEST - 2 VIEW COMPARISON:  07/31/2021 and prior radiographs FINDINGS: The cardiomediastinal silhouette is unremarkable. Chronic peribronchial thickening again noted. There is no evidence of focal airspace disease, pulmonary edema, suspicious pulmonary nodule/mass, pleural effusion, or pneumothorax. No acute bony abnormalities are identified. IMPRESSION: No active cardiopulmonary disease. Electronically Signed   By: Harmon PierJeffrey  Hu M.D.   On: 10/09/2021 15:08     Assessment and Plan :   PDMP not reviewed this encounter.  1. COPD exacerbation (HCC)   2. History of COVID-19    I suspect patient is having a COPD exacerbation and is in need of a higher level of care than we can provide in the urgent care setting.  I discussed this with patient at length and ultimately she verbalized understanding, contracts for safety and will present to the hospital now by personal vehicle.  She did not want to go by ambulance due to the cost burden and having her child with her.  Therefore I held off on calling EMS.   Wallis BambergMani, Kaylana Fenstermacher, [place] JerseyPA-C 10/09/21 1523

## 2021-10-09 NOTE — ED Provider Notes (Signed)
[facility] EMERGENCY DEPARTMENT Provider Note   CSN: [id number] Arrival date & time: 10/09/21  1840     History  Chief Complaint  Patient presents with   Cough    Cassandra Lucas is a 46 y.o. female.  46 year old female presents from urgent care with complaint of shortness of breath, body aches, concerned she may have the flu.  Patient states that she had COVID 1 month ago.  Went to urgent care and was given Z-Pak and prednisone.  Felt like the prednisone dose/taper was insufficient compared to prior treatment.  Patient was sent to the ER from urgent care with concern for tachycardia, low O2 sat at 91% and accessory muscle use.  Reports some improvement with her nebulizer treatments but feels like relief is very short-lived.  Cough is productive, sometimes with yellow/brown/clear sputum.  Past medical history of COPD, prediabetes, hyperlipidemia, asthma.  States her heart rate is normally in the 115-130 range.  Her doctor has mentioned her concern for her elevated heart rate however has not been on any medications we will referred her to cardiology.      Home Medications Prior to Admission medications   Medication Sig Start Date End Date Taking? Authorizing Provider  doxycycline (VIBRAMYCIN) 100 MG capsule Take 1 capsule (100 mg total) by mouth 2 (two) times daily. 10/09/21  Yes Jeannie Fend, PA-C  predniSONE (STERAPRED UNI-PAK 21 TAB) 10 MG (21) TBPK tablet Take by mouth daily. Take 6 tabs by mouth daily  for 2 days, then 5 tabs for 2 days, then 4 tabs for 2 days, then 3 tabs for 2 days, 2 tabs for 2 days, then 1 tab by mouth daily for 2 days 10/09/21  Yes Jeannie Fend, PA-C  acetaminophen (TYLENOL) 500 MG tablet Take 1,000 mg by mouth daily as needed for mild pain.     [provider]  albuterol (PROVENTIL) (2.5 MG/3ML) 0.083% nebulizer solution Take 2.5 mg by nebulization every 6 (six) hours as needed for wheezing or shortness of breath.    [provider]  albuterol (VENTOLIN HFA) 108 (90 Base) MCG/ACT inhaler Inhale 1-2 puffs into the lungs every 6 (six) hours as needed for wheezing or shortness of breath. 07/31/21   Particia Nearing, PA-C  BUTRANS 15 MCG/HR 1 patch once a week. 07/03/21   [provider]  cetirizine (ZYRTEC ALLERGY) 10 MG tablet Take 1 tablet (10 mg total) by mouth daily. 07/31/21   Particia Nearing, PA-C  Cholecalciferol (VITAMIN D) 2000 units CAPS Take 1 capsule by mouth daily.    [provider]  Cholecalciferol 5000 units capsule Take 1 capsule (5,000 Units total) by mouth daily. 11/24/15   Adline Potter, NP  cyclobenzaprine (FLEXERIL) 10 MG tablet Take 10 mg by mouth 3 (three) times daily as needed. 07/22/21   [provider]  fluticasone (FLONASE) 50 MCG/ACT nasal spray Place 1 spray into both nostrils 2 (two) times daily. 07/31/21   Particia Nearing, PA-C  folic acid (FOLVITE) 1 MG tablet Take 1 tablet by mouth daily. 07/09/19   [provider]  methotrexate (50 MG/ML) 1 g injection Inject into the vein once a week.    [provider]  oxyCODONE (OXY IR/ROXICODONE) 5 MG immediate release tablet Take 7.5 mg by mouth every 6 (six) hours as needed. pain    [provider]  pantoprazole (PROTONIX) 20 MG tablet Take 20 mg by mouth daily.    [provider]  pregabalin (  LYRICA) 150 MG capsule Take 150 mg by mouth 3 (three) times daily. 07/01/21   [provider]  simvastatin (ZOCOR) 10 MG tablet Take 10 mg by mouth at bedtime. 06/11/21   [provider]  TRULICITY 0.75 MG/0.5ML SOPN SMARTSIG:Milligram(s) SUB-Q Once a Week 07/01/21   [provider]  venlafaxine XR (EFFEXOR-XR) 37.5 MG 24 hr capsule Take 37.5 mg by mouth daily. 07/15/21   [provider]  Cimetidine (HEARTBURN RELIEF PO) Take 1 tablet by mouth daily as needed (heartburn).  05/31/20  [provider]      Allergies    Nsaids, Aspirin,  and Macrobid [nitrofurantoin macrocrystal]    Review of Systems   Review of Systems  Constitutional:  Positive for fever.  HENT:  Positive for congestion.   Respiratory:  Positive for cough, shortness of breath and wheezing.   Cardiovascular:  Negative for chest pain.  Gastrointestinal:  Negative for abdominal pain, nausea and vomiting.  Musculoskeletal:  Positive for arthralgias and myalgias.  Skin:  Negative for rash.  Allergic/Immunologic: Negative for immunocompromised state.  Neurological:  Negative for weakness.  Hematological:  Negative for adenopathy.  Psychiatric/Behavioral:  Negative for confusion.   All other systems reviewed and are negative.  Physical Exam Updated Vital Signs BP 118/86    Pulse (!) 122    Temp 99.1 F (37.3 C) (Oral)    Resp (!) 25    LMP 01/31/2011    SpO2 95%  Physical Exam Vitals and nursing note reviewed.  Constitutional:      Appearance: Normal appearance. She is not ill-appearing or toxic-appearing.  HENT:     Head: Normocephalic and atraumatic.     Nose: Congestion present.     Mouth/Throat:     Mouth: Mucous membranes are moist.  Eyes:     Conjunctiva/sclera: Conjunctivae normal.  Cardiovascular:     Rate and Rhythm: Regular rhythm. Tachycardia present.     Heart sounds: Normal heart sounds.  Pulmonary:     Effort: Pulmonary effort is normal.     Breath sounds: Wheezing present.  Musculoskeletal:     Cervical back: Neck supple.     Right lower leg: No edema.     Left lower leg: No edema.  Lymphadenopathy:     Cervical: No cervical adenopathy.  Skin:    General: Skin is warm and dry.  Neurological:     Mental Status: She is alert and oriented to person, place, and time.  Psychiatric:        Behavior: Behavior normal.    ED Results / Procedures / Treatments   Labs (all labs ordered are listed, but only abnormal results are displayed) Labs Reviewed  COMPREHENSIVE METABOLIC PANEL - Abnormal; Notable for the following  components:      Result Value   Sodium 134 (*)    Glucose, Bld 116 (*)    All other components within normal limits  CBC WITH DIFFERENTIAL/PLATELET - Abnormal; Notable for the following components:   Hemoglobin 15.2 (*)    All other components within normal limits  RESP PANEL BY RT-PCR (FLU A&B, COVID) ARPGX2  CULTURE, BLOOD (ROUTINE X 2)  CULTURE, BLOOD (ROUTINE X 2)  LACTIC ACID, PLASMA    EKG None  Radiology DG Chest 2 View  Result Date: 10/09/2021 CLINICAL DATA:  Cough and wheezing. EXAM: CHEST - 2 VIEW COMPARISON:  07/31/2021 and prior radiographs FINDINGS: The cardiomediastinal silhouette is unremarkable. Chronic peribronchial thickening again noted. There is no evidence of focal airspace disease,  pulmonary edema, suspicious pulmonary nodule/mass, pleural effusion, or pneumothorax. No acute bony abnormalities are identified. IMPRESSION: No active cardiopulmonary disease. Electronically Signed   By: Harmon Pier M.D.   On: 10/09/2021 15:08   CT Angio Chest PE W/Cm &/Or Wo Cm  Result Date: 10/09/2021 CLINICAL DATA:  Cough, wheezing, body aches. Pulmonary embolism (PE) suspected, high prob EXAM: CT ANGIOGRAPHY CHEST WITH CONTRAST TECHNIQUE: Multidetector CT imaging of the chest was performed using the standard protocol during bolus administration of intravenous contrast. Multiplanar CT image reconstructions and MIPs were obtained to evaluate the vascular anatomy. RADIATION DOSE REDUCTION: This exam was performed according to the departmental dose-optimization program which includes automated exposure control, adjustment of the mA and/or kV according to patient size and/or use of iterative reconstruction technique. CONTRAST:  75mL OMNIPAQUE IOHEXOL 350 MG/ML SOLN COMPARISON:  None. FINDINGS: Cardiovascular: No filling defects in the pulmonary arteries to suggest pulmonary emboli. Heart is normal size. Aorta is normal caliber. Mediastinum/Nodes: No mediastinal, hilar, or axillary adenopathy.  Trachea and esophagus are unremarkable. Prior right thyroidectomy. Lungs/Pleura: Patchy ground-glass opacities within both lungs could reflect small airways disease/alveolitis. No effusions. Upper Abdomen: Imaging into the upper abdomen demonstrates no acute findings. Musculoskeletal: Chest wall soft tissues are unremarkable. No acute bony abnormality. Review of the MIP images confirms the above findings. IMPRESSION: No evidence of pulmonary embolus. Minimal patchy ground-glass airspace opacities in both lungs could reflect early small airways disease/alveolitis. Electronically Signed   By: Charlett Nose M.D.   On: 10/09/2021 20:32    Procedures Procedures    Medications Ordered in ED Medications  sodium chloride 0.9 % bolus 1,000 mL (0 mLs Intravenous Stopped 10/09/21 2040)  ipratropium-albuterol (DUONEB) 0.5-2.5 (3) MG/3ML nebulizer solution 3 mL (3 mLs Nebulization Given 10/09/21 1920)  methylPREDNISolone sodium succinate (SOLU-MEDROL) 125 mg/2 mL injection 125 mg (125 mg Intravenous Given 10/09/21 1920)  albuterol (PROVENTIL) (2.5 MG/3ML) 0.083% nebulizer solution (2.5 mg  Given 10/09/21 1925)  iohexol (OMNIPAQUE) 350 MG/ML injection 75 mL (75 mLs Intravenous Contrast Given 10/09/21 2016)    ED Course/ Medical Decision Making/ A&P                           Medical Decision Making Amount and/or Complexity of Data Reviewed Labs: ordered. Radiology: ordered.  Risk Prescription drug management.   This patient presents to the ED for concern of cough, wheezing, shortness of breath, body aches/viral uri symptoms, this involves an extensive number of treatment options, and is a complaint that carries with it a high risk of complications and morbidity.  The differential diagnosis includes but not limited to COPD exacerbation, PE, pneumonia, pleural effusion, flu   Co morbidities that complicate the patient evaluation  COPD, tobacco use,    Additional history obtained:  Additional history  obtained from husband at bedside External records from outside source obtained and reviewed including UR visit today with normal CXR, O2 sat 91%   Lab Tests:  I Ordered, and personally interpreted labs.  The pertinent results include: CBC with normal white blood cell count, normal differential.  CMP without significantly elevated glucose this patient who is prediabetic and has been on steroids recently.  Lactic acid reassuring at 1.4.  COVID/flu negative.   Imaging Studies ordered:  I ordered imaging studies including CT chest PE study I independently visualized and interpreted imaging which showed bilateral groundglass opacities I agree with the radiologist interpretation   Cardiac Monitoring:  The patient was maintained on  a cardiac monitor.  I personally viewed and interpreted the cardiac monitored which showed an underlying rhythm of: sinus tachycardia   Medicines ordered and prescription drug management:  I ordered medication including DuoNeb, IV fluids, Solu-Medrol for wheezing, shortness of breath Reevaluation of the patient after these medicines showed that the patient improved I have reviewed the patients home medicines and have made adjustments as needed   Problem List / ED Course:  46 year old female presents with complaint of cough, congestion, wheezing and shortness of breath as above.  She is concerned that she may have the flu or pneumonia after having had COVID earlier this month.  Patient went to urgent care today, no improvement from prior Z-Pak and prednisone back from urgent care, was sent to the emergency room for her O2 sat of 91% with tachycardia and concern for accessory muscle use.  On the way to the emergency room, patient did her own home albuterol treatment.  On arrival, her O2 sat is normal however she is tachycardic, states that she is tachycardic at baseline with a heart rate of 115, her PCP is aware and has not been worked up.  She is wheezing throughout.   Patient was treated with DuoNeb with improvement.  Patient ambulates and maintains O2 sats.  She is feeling well and agreeable with plan for discharge home on prednisone doxycycline, antibiotic coverage due to COPD history with recent COVID illness and now abnormal chest CT showing alveolitis, negative for PE.  Discussed with Dr. Hyacinth MeekerMiller, ER attending, agrees with plan of care.   Reevaluation:  After the interventions noted above, I reevaluated the patient and found that they have :improved   Dispostion:  After consideration of the diagnostic results and the patients response to treatment, I feel that the patent would benefit from follow-up with primary care provider this week for recheck..          Final Clinical Impression(s) / ED Diagnoses Final diagnoses:  COPD with acute exacerbation (HCC)    Rx / DC Orders ED Discharge Orders          Ordered    doxycycline (VIBRAMYCIN) 100 MG capsule  2 times daily        10/09/21 2134    predniSONE (STERAPRED UNI-PAK 21 TAB) 10 MG (21) TBPK tablet  Daily        10/09/21 2134              Jeannie FendMurphy, Hadassah Rana A, PA-C 10/09/21 2154    Eber HongMiller, Brian, MD 10/09/21 2239

## 2021-10-09 NOTE — Discharge Instructions (Signed)
You are having a COPD exacerbation and are in need of higher level of care than we can provide in the urgent care setting.  Your chest x-ray is negative for pneumonia but you have a low oxygen level and a fast pulse, shortness of breath with difficulty breathing including the use of accessory muscles.  Your breathing treatments at home are not working and therefore you will need to go to the hospital to get more treatment for your COPD exacerbation.  You have declined transport by EMS due to the cost burden and having your child with you.  However, it is very important that she get to the hospital soon as possible.

## 2021-10-09 NOTE — ED Notes (Signed)
Patient is being discharged from the Urgent Care and sent to the Emergency Department via private vehicle . Per PA, patient is in need of higher level of care due to exacerbation of COPD. Patient is aware and verbalizes understanding of plan of care.  Vitals:   10/09/21 1440  BP: 128/84  Pulse: (!) 135  Resp: 18  Temp: 98.3 F (36.8 C)  SpO2: 91%

## 2021-10-15 LAB — CULTURE, BLOOD (ROUTINE X 2)
Culture: NO GROWTH
Culture: NO GROWTH
Special Requests: ADEQUATE
Special Requests: ADEQUATE

## 2021-10-29 ENCOUNTER — Other Ambulatory Visit: Payer: Self-pay | Admitting: Family Medicine

## 2021-10-31 NOTE — Telephone Encounter (Signed)
Requested Prescriptions  Pending Prescriptions Disp Refills   cetirizine (ZYRTEC) 10 MG tablet [Pharmacy Med Name: Cetirizine HCl 10 MG Oral Tablet] 30 tablet 0    Sig: Take 1 tablet by mouth once daily     Ear, Nose, and Throat:  Antihistamines 2 Failed - 10/29/2021  7:55 AM      Failed - Valid encounter within last 12 months    Recent Outpatient Visits   None            Passed - Cr in normal range and within 360 days    Creat  Date Value Ref Range Status  01/28/2018 0.79 0.50 - 1.10 mg/dL Final   Creatinine, Ser  Date Value Ref Range Status  10/09/2021 0.66 0.44 - 1.00 mg/dL Final

## 2022-02-27 ENCOUNTER — Ambulatory Visit: Payer: Medicaid Other | Admitting: Orthopedic Surgery

## 2022-02-27 ENCOUNTER — Encounter: Payer: Self-pay | Admitting: Orthopedic Surgery

## 2022-02-27 VITALS — BP 147/95 | HR 125 | Ht <= 58 in | Wt 195.0 lb

## 2022-02-27 DIAGNOSIS — G5603 Carpal tunnel syndrome, bilateral upper limbs: Secondary | ICD-10-CM | POA: Diagnosis not present

## 2022-02-27 NOTE — Progress Notes (Signed)
Chief Complaint  Patient presents with   Wrist Pain    Bilateral carpal tunnel Right >Left     HPI: 46 year old female referred from [facility] medical follow there for chronic pain presents with bilateral carpal tunnel syndrome which she says is severe.  She had a 2nd nerve conduction study which is unavailable for [place] to review and she says that shows that her nerve condition has worsened since her 1st nerve study.  However she would like to have something else done other than surgery.  She has diabetes, she still smokes.  She has had symptoms greater than a year.  And she has chronic pain  These are all risks for poor outcome and this is related to her    Past Medical History:  Diagnosis Date   Anemia    Anxiety    Arthritis    Asthma    Carpal tunnel syndrome, bilateral 11/22/2015   Will try wrist splints   Community acquired pneumonia    COPD (chronic obstructive pulmonary disease) ([place])    Decreased libido 03/26/2015   Diabetes mellitus without complication ([place])    prediabetes   Dyslipidemia 11/24/2015   Empty sella (HCC)    Fibromyalgia    Fibromyalgia    Hot flashes 03/26/2015   Hyperlipidemia    Kidney stones    Productive cough    smoker   Renal disorder    Vitamin D deficiency 11/24/2015    BP (!) 147/95   Pulse (!) 125   Ht 4\' 10"  (1.473 m)   Wt 195 lb (88.5 kg)   LMP 01/22/2011   BMI 40.76 kg/m    General appearance: Well-developed well-nourished no gross deformities  Cardiovascular normal pulse and perfusion normal color without edema   Psychological: Awake alert and oriented x3 mood and affect normal  Skin no lacerations or ulcerations no nodularity no palpable masses, no erythema or nodularity  Musculoskeletal: Her hands are swollen the skin feels very rough.  She has sensory loss in the median nerve distribution.  She has normal range of motion and no atrophy.  Imaging no imaging  A/P  She probably has bilateral carpal tunnel syndrome.  I  would like to get the study to confirm the carpal tunnel syndrome and then send her for carpal tunnel injections which I do not do  She does report that prednisone does help with the symptoms but bracing does not

## 2022-03-02 ENCOUNTER — Telehealth: Payer: Self-pay | Admitting: Radiology

## 2022-03-02 DIAGNOSIS — G5603 Carpal tunnel syndrome, bilateral upper limbs: Secondary | ICD-10-CM

## 2022-03-02 NOTE — Telephone Encounter (Signed)
Called her to advise she will get a call from the hand surgeon in [place]. Put in referral voice mail box not set up yet.

## 2022-03-02 NOTE — Telephone Encounter (Signed)
[facility] faxed the nerve study over she indicated she has had one after the 05/25/21 study but that's the most recent one the one she had before that was 2019   Asa Lente, MD  Physician Neurology Progress Notes    Signed Encounter Date:  05/25/2021   Signed                                          Full Name: Cassandra Lucas              Gender:           Female MRN #:       [id number]                    Date of Birth:  09-27-1975    Visit Date:                     05/25/2021 14:29 Age:                               46 Years Examining Physician:  Despina Arias, MD  Referring Physician:    Merryl Hacker, PA    History: Ms. Cassandra Lucas is a 46 year old woman with right greater than left arm/hand numbness and pain.  She also notes myalgias.  On examination, strength was 4+/5 in the right APB muscle.  An NCV/EMG study 05/17/2018 showed right greater than left carpal tunnel syndrome.   Nerve conduction studies: Right median motor response was moderately delayed and latency with a borderline low amplitude but normal forearm conduction.  The left median motor response had a borderline distal latency with normal amplitude and forearm conduction.  The right ulnar motor response had normal distal latency, amplitude and forearm conduction.   The right median sensory response was nonreactive.  The left median sensory response was mildly delayed in latency with borderline normal amplitude.  The right ulnar sensory response had normal peak latency and amplitude.   Electromyography: Needle EMG of selected muscles of the right arm and hand was performed.  Motor unit morphology and recruitment was normal.  There was no abnormal spontaneous activity.  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  This has progressed compared to the 2019 study. Mild left median neuropathy across the wrist.   This is actually mildly improved compared to the 2019 study. No evidence of radiculopathy or myopathy involving the right arm.   Verbal informed consent was obtained from the patient, patient was informed of potential risk of procedure, including bruising, bleeding, hematoma formation, infection, muscle weakness, muscle pain, numbness, among others.    Clinical note: Due to worsening of the median neuropathy on the right, she might benefit from carpal tunnel release. --RAS       MNC    Nerve / Sites Muscle Latency Ref. Amplitude Ref. Rel Amp Segments Distance Velocity Ref. Area      ms ms mV mV %   cm m/s m/s mVms  R Median - APB     Wrist APB 8.0 ?4.4 4.0 ?4.0 100 Wrist - APB 7     17.3     Upper arm APB 11.1   3.3   82.3 Upper arm - Wrist 16 51 ?49 16.3  L Median - APB     Wrist APB 4.4 ?4.4 6.1 ?4.0 100 Wrist - APB 7     26.0     Upper arm APB 7.6   6.1   99.9 Upper arm - Wrist 17 52 ?49 24.6  R Ulnar - ADM     Wrist ADM 2.9 ?3.3 8.3 ?6.0 100 Wrist - ADM 7     22.3     B.Elbow ADM 4.9   7.3   87.7 B.Elbow - Wrist 15 72 ?49 19.1     A.Elbow ADM 6.4   6.4   87.7 A.Elbow - B.Elbow 10 70 ?49 17.3           SNC    Nerve / Sites Rec. Site Peak Lat Ref.  Amp Ref. Segments Distance      ms ms V V   cm  R Median - Orthodromic (Dig II, Mid palm)     Dig II Wrist NR ?3.4 NR ?10 Dig II - Wrist 13  L Median - Orthodromic (Dig II, Mid palm)     Dig II Wrist 4.4 ?3.4 10 ?10 Dig II - Wrist 13  R Ulnar - Orthodromic, (Dig V, Mid palm)     Dig V Wrist 2.4 ?3.1 6 ?5 Dig V - Wrist 49           F  Wave    Nerve F Lat Ref.    ms ms  R Ulnar - ADM 23.5 ?32.0                  EMG Summary Table      Spontaneous MUAP Recruitment  Muscle IA Fib PSW Fasc Other Amp Dur. Poly Pattern  R. Deltoid Normal None None None _______ Normal Normal Normal Normal  R. Triceps brachii Normal None None None _______ Normal Normal Normal Normal  R. Biceps brachii Normal None None None _______ Normal Normal  Normal Normal  R. Extensor digitorum communis Normal None None None _______ Normal Normal Normal Normal  R. Abductor pollicis brevis Normal None None None _______ Normal Normal Normal Normal  R. First dorsal interosseous Normal None None None _______ Normal Normal Normal Normal              Electronically signed by Asa Lente, MD at 05/25/2021  5:17 PM Procedure visit on 05/25/2021  Procedure visit on 05/25/2021   Detailed Report  Note shared with patient Additional Documentation  Vitals:  LMP 01/22/2011  Flowsheets:  Method of Visit

## 2022-03-02 NOTE — Telephone Encounter (Signed)
OK FAX WITH REFERRAL TO HAND

## 2022-04-11 ENCOUNTER — Ambulatory Visit: Payer: Medicaid Other | Admitting: Orthopedic Surgery

## 2022-06-12 ENCOUNTER — Ambulatory Visit
Admission: EM | Admit: 2022-06-12 | Discharge: 2022-06-12 | Disposition: A | Discharge status: Home / Self Care | Payer: Medicaid Other

## 2022-06-12 DIAGNOSIS — R0602 Shortness of breath: Secondary | ICD-10-CM

## 2022-06-12 DIAGNOSIS — R000 Tachycardia, unspecified: Secondary | ICD-10-CM

## 2022-06-12 DIAGNOSIS — J441 Chronic obstructive pulmonary disease with (acute) exacerbation: Secondary | ICD-10-CM

## 2022-06-12 NOTE — ED Triage Notes (Signed)
Pt presents with cough for past 3 days and is concerned with pneumonia

## 2022-06-12 NOTE — ED Provider Notes (Signed)
Patient presents to urgent care for 3 days of cough, congestion, pain in her back, and decreased energy.  She is concerned for pneumonia.  Reports "I keep pneumonia."  She is using her daily and rescue inhaler without much relief.  In triage, she is tachycardic and tachypneic.  Oxygen level on room air is borderline low at 92%.  She is not in any acute distress, no respiratory distress or accessory muscle use.  I recommended further evaluation and management in the emergency room.  Unfortunately, given her unstable vital signs and history, I am concerned about bilateral pneumonia, pulmonary embolism, among other etiologies.  I believe she is to be evaluated in the emergency room for further management.  After discussion, patient is agreement to this plan and reports she will transport by private vehicle to the emergency room immediately after leaving urgent care.  She is safe to do so at this time.   Eulogio Bear, NP 06/12/22 1700

## 2022-06-12 NOTE — Discharge Instructions (Addendum)
Please go directly to the Emergency Room for further evaluation of your breathing and abnormal vital signs.

## 2022-11-16 ENCOUNTER — Encounter: Payer: Self-pay | Admitting: Radiology

## 2023-06-08 ENCOUNTER — Other Ambulatory Visit (HOSPITAL_COMMUNITY): Payer: Self-pay | Admitting: Nurse Practitioner

## 2023-06-08 DIAGNOSIS — Z1231 Encounter for screening mammogram for malignant neoplasm of breast: Secondary | ICD-10-CM

## 2023-08-13 ENCOUNTER — Ambulatory Visit (INDEPENDENT_AMBULATORY_CARE_PROVIDER_SITE_OTHER): Payer: Medicaid Other

## 2023-08-13 ENCOUNTER — Encounter: Payer: Self-pay | Admitting: Podiatry

## 2023-08-13 ENCOUNTER — Ambulatory Visit: Payer: Medicaid Other | Admitting: Podiatry

## 2023-08-13 DIAGNOSIS — E1142 Type 2 diabetes mellitus with diabetic polyneuropathy: Secondary | ICD-10-CM | POA: Diagnosis not present

## 2023-08-13 DIAGNOSIS — M779 Enthesopathy, unspecified: Secondary | ICD-10-CM

## 2023-08-13 DIAGNOSIS — M7752 Other enthesopathy of left foot: Secondary | ICD-10-CM | POA: Diagnosis not present

## 2023-08-13 DIAGNOSIS — M7751 Other enthesopathy of right foot: Secondary | ICD-10-CM | POA: Diagnosis not present

## 2023-08-13 DIAGNOSIS — M722 Plantar fascial fibromatosis: Secondary | ICD-10-CM

## 2023-08-13 NOTE — Progress Notes (Signed)
  Subjective:  Patient ID: Cassandra Lucas, female    DOB: 07/14/1976,   MRN: [id number]  No chief complaint on file.   47 y.o. female presents for concern of bilateral foot pain.  Relates the pain has been ongoing for a while. Relates on the left the heel has been sharp shooting pain and painful after first steps from rest on the right she gets more feeling that her bones are breaking  Relates burning and tingling in their feet.  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 Also has history of fibromyalgia. Patient is diabetic and last A1c was  Lab Results  Component Value Date   HGBA1C 6.4 (H) 01/28/2018   .  PCP:  Merryl Hacker, NP    . Denies any other pedal complaints. Denies n/v/f/c.   Past Medical History:  Diagnosis Date   Anemia    Anxiety    Arthritis    Asthma    Carpal tunnel syndrome, bilateral 11/22/2015   Will try wrist splints   Community acquired pneumonia    COPD (chronic obstructive pulmonary disease) (HCC)    Decreased libido 03/26/2015   Diabetes mellitus without complication (HCC)    prediabetes   Dyslipidemia 11/24/2015   Empty sella (HCC)    Fibromyalgia    Fibromyalgia    Hot flashes 03/26/2015   Hyperlipidemia    Kidney stones    Productive cough    smoker   Renal disorder    Vitamin D deficiency 11/24/2015    Objective:  Physical Exam: Vascular: DP/PT pulses 2/4 bilateral. CFT <3 seconds. Absent hair growth on digits. Edema noted to bilateral lower extremities. Xerosis noted bilaterally.  Skin. No lacerations or abrasions bilateral feet. Nails 1-5 bilateral  are normal in appearance.  Musculoskeletal: MMT 5/5 bilateral lower extremities in DF, PF, Inversion and Eversion. Deceased ROM in DF of ankle joint. Tender to the medial calcaneal tubercle bilaterally . No pain with achilles, PT or arch. No pain with calcaneal squeeze.  Neurological: Sensation intact to light touch. Protective sensation diminished  bilateral.    Assessment:   1. Bilateral plantar fasciitis   2. Type 2 diabetes mellitus with peripheral neuropathy (HCC)      Plan:  Patient was evaluated and treated and all questions answered. Discussed plantar fasciitis with patient.  X-rays reviewed and discussed with patient. No acute fractures or dislocations noted. Mild spurring noted at inferior calcaneus.  Discussed treatment options including, ice, NSAIDS, supportive shoes, bracing, and stretching. Stretching exercises provided to be done on a daily basis.   Already taking prednisone.  PF brace dispensed.  Discussed neuropathy and etiology as well as treatment with patient.  Radiographs reviewed and discussed with patient.  -Discussed and educated patient on diabetic foot care, especially with  regards to the vascular, neurological and musculoskeletal systems.  -Stressed the importance of good glycemic control and the detriment of not  controlling glucose levels in relation to the foot. -Discussed supportive shoes at all times and checking feet regularly.  -Has been on Lyrica with no help. Will look into [place].  Follow-up 6 weeks or sooner if any problems arise. In the meantime, encouraged to call the office with any questions, concerns, change in symptoms.     Louann Sjogren, DPM

## 2023-08-13 NOTE — Patient Instructions (Signed)

## 2023-08-15 ENCOUNTER — Other Ambulatory Visit: Payer: Self-pay | Admitting: Podiatry

## 2023-08-15 DIAGNOSIS — E1142 Type 2 diabetes mellitus with diabetic polyneuropathy: Secondary | ICD-10-CM

## 2023-08-15 MED ORDER — CAPSAICIN-CLEANSING GEL 8 % EX KIT: 4.0000 | PACK | Freq: Once | CUTANEOUS | Status: DC

## 2023-09-26 ENCOUNTER — Other Ambulatory Visit: Payer: Self-pay | Admitting: Podiatry

## 2023-09-26 DIAGNOSIS — E1142 Type 2 diabetes mellitus with diabetic polyneuropathy: Secondary | ICD-10-CM

## 2023-09-26 MED ORDER — CAPSAICIN-CLEANSING GEL 8 % EX KIT: 4.0000 | PACK | Freq: Once | CUTANEOUS | Status: DC

## 2023-10-03 ENCOUNTER — Other Ambulatory Visit: Payer: Self-pay

## 2023-10-03 ENCOUNTER — Institutional Professional Consult (permissible substitution): Payer: Medicaid Other | Admitting: Primary Care

## 2023-10-03 MED ORDER — CAPSAICIN-CLEANSING GEL 8 % EX KIT: 4.0000 | PACK | Freq: Once | CUTANEOUS | 0 refills | Status: AC

## 2023-10-04 ENCOUNTER — Telehealth: Payer: Self-pay

## 2023-10-04 NOTE — Telephone Encounter (Signed)
PA request received from [facility] for Qutenza (4 patch) 8% kit. PA submitted through covermymeds and waiting on response

## 2023-10-08 ENCOUNTER — Ambulatory Visit: Payer: Medicaid Other | Admitting: Podiatry

## 2023-10-16 NOTE — Telephone Encounter (Signed)
[facility] called - Cassandra Lucas was denied [phone]

## 2023-10-19 ENCOUNTER — Institutional Professional Consult (permissible substitution): Payer: Medicaid Other | Admitting: Internal Medicine

## 2023-11-14 ENCOUNTER — Ambulatory Visit: Payer: Medicaid Other | Admitting: Podiatry

## 2023-11-14 DIAGNOSIS — E1142 Type 2 diabetes mellitus with diabetic polyneuropathy: Secondary | ICD-10-CM

## 2023-11-14 DIAGNOSIS — M722 Plantar fascial fibromatosis: Secondary | ICD-10-CM

## 2023-11-14 MED ORDER — TRIAMCINOLONE ACETONIDE 10 MG/ML IJ SUSP
2.5000 mg | Freq: Once | INTRAMUSCULAR | Status: AC
Start: 2023-11-14 — End: 2023-11-14
  Administered 2023-11-14: 2.5 mg via INTRA_ARTICULAR

## 2023-11-14 MED ORDER — DEXAMETHASONE SODIUM PHOSPHATE 120 MG/30ML IJ SOLN
4.0000 mg | Freq: Once | INTRAMUSCULAR | Status: AC
  Administered 2023-11-14: 4 mg via INTRA_ARTICULAR

## 2023-11-14 NOTE — Progress Notes (Signed)
  Subjective:  Patient ID: Cassandra Lucas, female    DOB: 1976-06-15,   MRN: [id number]  No chief complaint on file.   48 y.o. female presents for follow-up of bilateral plantar fasciitis and neuropathy. She has tried some stretching and brace did not helpCurrently denied for use of Qtrenza.  Gets pain all over her body and relates worsened because has gone down on steroid dose. 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 Also has history of fibromyalgia. Patient is diabetic and last A1c was  Lab Results  Component Value Date   HGBA1C 6.4 (H) 01/28/2018   .  PCP:  Merryl Hacker, NP    . Denies any other pedal complaints. Denies n/v/f/c.   Past Medical History:  Diagnosis Date   Anemia    Anxiety    Arthritis    Asthma    Carpal tunnel syndrome, bilateral 11/22/2015   Will try wrist splints   Community acquired pneumonia    COPD (chronic obstructive pulmonary disease) (HCC)    Decreased libido 03/26/2015   Diabetes mellitus without complication (HCC)    prediabetes   Dyslipidemia 11/24/2015   Empty sella (HCC)    Fibromyalgia    Fibromyalgia    Hot flashes 03/26/2015   Hyperlipidemia    Kidney stones    Productive cough    smoker   Renal disorder    Vitamin D deficiency 11/24/2015    Objective:  Physical Exam: Vascular: DP/PT pulses 2/4 bilateral. CFT <3 seconds. Absent hair growth on digits. Edema noted to bilateral lower extremities. Xerosis noted bilaterally.  Skin. No lacerations or abrasions bilateral feet. Nails 1-5 bilateral  are normal in appearance.  Musculoskeletal: MMT 5/5 bilateral lower extremities in DF, PF, Inversion and Eversion. Deceased ROM in DF of ankle joint. Tender to the medial calcaneal tubercle bilaterally . No pain with achilles, PT or arch. No pain with calcaneal squeeze.  Neurological: Sensation intact to light touch. Protective sensation diminished bilateral.    Assessment:   No diagnosis  found.    Plan:  Patient was evaluated and treated and all questions answered. Discussed plantar fasciitis with patient.  X-rays reviewed and discussed with patient. No acute fractures or dislocations noted. Mild spurring noted at inferior calcaneus.  Discussed treatment options including, ice, NSAIDS, supportive shoes, bracing, and stretching. Continue stretching and brace.  Injection offered today.  Procedure below.  Discussed neuropathy and etiology as well as treatment with patient.  Radiographs reviewed and discussed with patient.  -Discussed and educated patient on diabetic foot care, especially with  regards to the vascular, neurological and musculoskeletal systems.  -Stressed the importance of good glycemic control and the detriment of not  controlling glucose levels in relation to the foot. -Discussed supportive shoes at all times and checking feet regularly.  -Was unable to get approval for Qutenza currently.  Follow-up 6 weeks or sooner if any problems arise. In the meantime, encouraged to call the office with any questions, concerns, change in symptoms.   Procedure: Injection Tendon/Ligament Discussed alternatives, risks, complications and verbal consent was obtained.  Location: Left plantar fascia . Skin Prep: Alcohol. Injectate: 1cc 0.5% marcaine plain, 1 cc dexamethasone 0.5 cc kenalog  Disposition: Patient tolerated procedure well. Injection site dressed with a band-aid.  Post-injection care was discussed and return precautions discussed.     Louann Sjogren, DPM

## 2023-11-28 ENCOUNTER — Ambulatory Visit: Admission: EM | Admit: 2023-11-28 | Discharge: 2023-11-28 | Disposition: A | Discharge status: Home / Self Care

## 2023-11-28 ENCOUNTER — Encounter: Payer: Self-pay | Admitting: Emergency Medicine

## 2023-11-28 DIAGNOSIS — M549 Dorsalgia, unspecified: Secondary | ICD-10-CM

## 2023-11-28 DIAGNOSIS — J441 Chronic obstructive pulmonary disease with (acute) exacerbation: Secondary | ICD-10-CM | POA: Diagnosis not present

## 2023-11-28 MED ORDER — PROMETHAZINE-DM 6.25-15 MG/5ML PO SYRP: 5.0000 mL | ORAL_SOLUTION | Freq: Four times a day (QID) | ORAL | 0 refills | Status: DC | PRN

## 2023-11-28 MED ORDER — AZITHROMYCIN 250 MG PO TABS: ORAL_TABLET | ORAL | 0 refills | Status: DC

## 2023-11-28 MED ORDER — PREDNISONE 20 MG PO TABS: 40.0000 mg | ORAL_TABLET | Freq: Every day | ORAL | 0 refills | Status: DC

## 2023-11-28 MED ORDER — ALBUTEROL SULFATE HFA 108 (90 BASE) MCG/ACT IN AERS: 2.0000 | INHALATION_SPRAY | RESPIRATORY_TRACT | 0 refills | Status: DC | PRN

## 2023-11-28 NOTE — ED Triage Notes (Signed)
 Mid back pain x 4 days.  Hurts to breath and bend over

## 2023-12-02 NOTE — ED Provider Notes (Signed)
 [facility]    CSN: [id number] Arrival date & time: 11/28/23  1934      History   Chief Complaint No chief complaint on file.   HPI Cassandra Lucas is a 48 y.o. female.   Presenting today with 4 day history of mid back pain, cough, pleuritic CP. Denies fever, chills, abdominal pain, N/V/D, sore throat, congestion. So far trying OTC remedies with minimal relief. Hx of asthma and COPD on albuterol as needed which provides small periods of relief.     Past Medical History:  Diagnosis Date   Anemia    Anxiety    Arthritis    Asthma    Carpal tunnel syndrome, bilateral 11/22/2015   Will try wrist splints   Community acquired pneumonia    COPD (chronic obstructive pulmonary disease) (HCC)    Decreased libido 03/26/2015   Diabetes mellitus without complication (HCC)    prediabetes   Dyslipidemia 11/24/2015   Empty sella (HCC)    Fibromyalgia    Fibromyalgia    Hot flashes 03/26/2015   Hyperlipidemia    Kidney stones    Productive cough    smoker   Renal disorder    Vitamin D deficiency 11/24/2015    Patient Active Problem List   Diagnosis Date Noted   Asthma 07/31/2021   Fibromyalgia 06/23/2020   High risk medication use 06/23/2020   Rheumatoid arthritis involving multiple sites with positive rheumatoid factor (HCC) 06/23/2020   Pain 04/11/2018   Weakness 04/11/2018   Fatigue 11/30/2016   Body aches 11/30/2016   Vitamin D deficiency 11/24/2015   Dyslipidemia 11/24/2015   Carpal tunnel syndrome, bilateral 11/22/2015   Decreased libido 03/26/2015   Hot flashes 03/26/2015   Kidney stones 04/11/2011    Class: Chronic   Menometrorrhagia 04/11/2011    Class: Chronic   Dyspareunia (not due to a general medical condition) 04/11/2011    Class: Chronic    Past Surgical History:  Procedure Laterality Date   ABDOMINAL HYSTERECTOMY     has ovaries.    BREAST CYST EXCISION     CESAREAN SECTION  2006   [facility]   LAPAROSCOPIC CHOLECYSTECTOMY  2002    [facility]   LAPAROSCOPIC HYSTERECTOMY  04/12/2011   Procedure: HYSTERECTOMY TOTAL LAPAROSCOPIC;  Surgeon: Lazaro Arms, MD;  Location: [facility];  Service: Gynecology;  Laterality: N/A;   THYROIDECTOMY, PARTIAL  2012   [facility], [facility]    OB History     Gravida  4   Para  4   Term  0   Preterm  0   AB  0   Living  4      SAB  0   IAB  0   Ectopic  0   Multiple  0   Live Births               Home Medications    Prior to Admission medications   Medication Sig Start Date End Date Taking? Authorizing Provider  albuterol (VENTOLIN HFA) 108 (90 Base) MCG/ACT inhaler Inhale 2 puffs into the lungs every 4 (four) hours as needed. 11/28/23  Yes Particia Nearing, PA-C  azithromycin (ZITHROMAX) 250 MG tablet Take first 2 tablets together, then 1 every day until finished. 11/28/23  Yes Particia Nearing, PA-C  predniSONE (DELTASONE) 10 MG tablet Take 10 mg by mouth daily with breakfast.   Yes [provider]  predniSONE (DELTASONE) 20 MG tablet Take 2 tablets (40 mg total) by mouth daily with breakfast.  11/28/23  Yes Particia Nearing, PA-C  promethazine-dextromethorphan (PROMETHAZINE-DM) 6.25-15 MG/5ML syrup Take 5 mLs by mouth 4 (four) times daily as needed. 11/28/23  Yes Particia Nearing, PA-C  acetaminophen (TYLENOL) 500 MG tablet Take 1,000 mg by mouth daily as needed for mild pain.     [provider]  albuterol (PROVENTIL) (2.5 MG/3ML) 0.083% nebulizer solution Take 2.5 mg by nebulization every 6 (six) hours as needed for wheezing or shortness of breath.    [provider]  albuterol (VENTOLIN HFA) 108 (90 Base) MCG/ACT inhaler Inhale 1-2 puffs into the lungs every 6 (six) hours as needed for wheezing or shortness of breath. 07/31/21   Particia Nearing, PA-C  BUTRANS 15 MCG/HR 1 patch once a week. 07/03/21   [provider]  BUTRANS 20 MCG/HR PTWK 1 patch once a week. 02/17/22   [provider]  cetirizine (ZYRTEC  ALLERGY) 10 MG tablet Take 1 tablet (10 mg total) by mouth daily. 07/31/21   Particia Nearing, PA-C  Cholecalciferol (VITAMIN D) 2000 units CAPS Take 1 capsule by mouth daily.    [provider]  Cholecalciferol 5000 units capsule Take 1 capsule (5,000 Units total) by mouth daily. 11/24/15   Adline Potter, NP  cyclobenzaprine (FLEXERIL) 10 MG tablet Take 10 mg by mouth 3 (three) times daily as needed. 07/22/21   [provider]  fluticasone (FLONASE) 50 MCG/ACT nasal spray Place 1 spray into both nostrils 2 (two) times daily. 07/31/21   Particia Nearing, PA-C  folic acid (FOLVITE) 1 MG tablet Take 1 tablet by mouth daily. 07/09/19   [provider]  methotrexate (50 MG/ML) 1 g injection Inject into the vein once a week.    [provider]  oxyCODONE (OXY IR/ROXICODONE) 5 MG immediate release tablet Take 7.5 mg by mouth every 6 (six) hours as needed. pain    [provider]  oxyCODONE-acetaminophen (PERCOCET) 7.5-325 MG tablet Take 1 tablet by mouth 3 (three) times daily as needed. 02/04/22   [provider]  pantoprazole (PROTONIX) 20 MG tablet Take 20 mg by mouth daily.    [provider]  pantoprazole (PROTONIX) 40 MG tablet Take 40 mg by mouth daily. 02/20/22   [provider]  pregabalin (LYRICA) 150 MG capsule Take 150 mg by mouth 3 (three) times daily. 07/01/21   [provider]  simvastatin (ZOCOR) 10 MG tablet Take 10 mg by mouth at bedtime. 06/11/21   [provider]  TRULICITY 0.75 MG/0.5ML SOPN SMARTSIG:Milligram(s) SUB-Q Once a Week 07/01/21   [provider]  venlafaxine XR (EFFEXOR-XR) 37.5 MG 24 hr capsule Take 37.5 mg by mouth daily. 07/15/21   [provider]  Cimetidine (HEARTBURN RELIEF PO) Take 1 tablet by mouth daily as needed (heartburn).  05/31/20  [provider]    Family History Family History  Problem Relation Age of Onset   Breast cancer  Mother    Diabetes Father    Heart failure Father    Migraines Sister    Scoliosis Daughter    ADD / ADHD Son    Cancer Maternal Grandfather        pancreatic   Diabetes Paternal Grandmother    Congestive Heart Failure Paternal Grandmother    ADD / ADHD Daughter    Scoliosis Daughter    Hyperlipidemia Daughter    Diabetes Daughter        prediabetes   Bipolar disorder Son    ADD / ADHD Son  Social History Social History   Tobacco Use   Smoking status: Every Day    Current packs/day: 0.50    Average packs/day: 0.5 packs/day for 17.0 years (8.5 ttl pk-yrs)    Types: Cigarettes   Smokeless tobacco: Never  Vaping Use   Vaping status: Former  Substance Use Topics   Alcohol use: No   Drug use: No     Allergies   Nsaids, Aspirin, Flagyl [metronidazole], and Macrobid [nitrofurantoin macrocrystal]   Review of Systems Review of Systems PER HPI  Physical Exam Triage Vital Signs ED Triage Vitals [11/28/23 1939]  Encounter Vitals Group     BP 105/71     Systolic BP Percentile      Diastolic BP Percentile      Pulse Rate (!) 103     Resp 20     Temp 98.1 F (36.7 C)     Temp Source Oral     SpO2 96 %     Weight      Height      Head Circumference      Peak Flow      Pain Score 8     Pain Loc      Pain Education      Exclude from Growth Chart    No data found.  Updated Vital Signs BP 105/71 (BP Location: Right Arm)   Pulse (!) 103   Temp 98.1 F (36.7 C) (Oral)   Resp 20   LMP 01/22/2011   SpO2 96%   Visual Acuity Right Eye Distance:   Left Eye Distance:   Bilateral Distance:    Right Eye Near:   Left Eye Near:    Bilateral Near:     Physical Exam Vitals and nursing note reviewed.  Constitutional:      Appearance: Normal appearance.  HENT:     Head: Atraumatic.     Right Ear: Tympanic membrane and external ear normal.     Left Ear: Tympanic membrane and external ear normal.     Nose: Nose normal.     Mouth/Throat:     Mouth: Mucous  membranes are moist.  Eyes:     Extraocular Movements: Extraocular movements intact.     Conjunctiva/sclera: Conjunctivae normal.  Cardiovascular:     Rate and Rhythm: Normal rate and regular rhythm.     Heart sounds: Normal heart sounds.  Pulmonary:     Effort: Pulmonary effort is normal.     Breath sounds: Wheezing present.  Musculoskeletal:        General: Normal range of motion.     Cervical back: Normal range of motion and neck supple.  Skin:    General: Skin is warm and dry.  Neurological:     Mental Status: She is alert and oriented to person, place, and time.  Psychiatric:        Mood and Affect: Mood normal.        Thought Content: Thought content normal.      UC Treatments / Results  Labs (all labs ordered are listed, but only abnormal results are displayed) Labs Reviewed - No data to display  EKG   Radiology No results found.  Procedures Procedures (including critical care time)  Medications Ordered in UC Medications - No data to display  Initial Impression / Assessment and Plan / UC Course  I have reviewed the triage vital signs and the nursing notes.  Pertinent labs & imaging results that were available during my care of the  patient were reviewed by me and considered in my medical decision making (see chart for details).     Treat with prednisone, zithromax, albuterol inhaler prn, prednisone, phenergan dm. Suspect muscular back pain, COPD exacerbation. Return for worsening sxs. Final Clinical Impressions(s) / UC Diagnoses   Final diagnoses:  COPD exacerbation (HCC)  Mid back pain   Discharge Instructions   None    ED Prescriptions     Medication Sig Dispense Auth. Provider   predniSONE (DELTASONE) 20 MG tablet Take 2 tablets (40 mg total) by mouth daily with breakfast. 10 tablet Particia Nearing, PA-C   azithromycin (ZITHROMAX) 250 MG tablet Take first 2 tablets together, then 1 every day until finished. 6 tablet Particia Nearing, [place]   albuterol (VENTOLIN HFA) 108 (90 Base) MCG/ACT inhaler Inhale 2 puffs into the lungs every 4 (four) hours as needed. 18 g Roosvelt Maser [place], [place]   promethazine-dextromethorphan (PROMETHAZINE-DM) 6.25-15 MG/5ML syrup Take 5 mLs by mouth 4 (four) times daily as needed. 100 mL Particia Nearing, [place]      PDMP not reviewed this encounter.   Particia Nearing, [place] 12/02/23 2241

## 2023-12-09 NOTE — Progress Notes (Signed)
 12/11/23- 48 yoF Smoker (1/2 ppd, down from 1 ppd) for Sleep evaluation and Pulmonary evaluation courtesy of Naaman Plummer, NP with concern of fatigue Medical problem list includes Asthma/ COPD, DM2, Rheumatoid Arthritis,HTN,  -meds include flexeril, Bu-trans, prometh-DM, Effexor XR, prednisone 10, Percocet/oxycodone, Albuterol neb, Albuterol hfa, flonase, Combivent Epworth score-13 Body weight today- 190 lbs -----Fatigue during the day. Not sleeping well.  Snoring.  Also cough, wheeze and SOB with recurrent pneumonias x 10 years. Describes snoring, witnessed apnea, frequent nocturia, never feels rested.  Flexeril helps sleep. 2-3 caffeine sodas/ day. Occasional naps. Not working. Tonsillectomy. Thyroid surgery.     Also concerned about dyspnea on exertion- hills, stairs. Daily cough with white phlegm Hx several pneumonias. Rapid heart rate routinely. Recent CXR at Urgent Care outside our system. She was not told of any problem.  We are going to try samples of Breztri, apply for [employer], and emhasize smking cessation. Discussed the use of AI scribe software for clinical note transcription with the patient, who gave verbal consent to proceed. History of Present Illness   The patient, with a history of recurrent pneumonia and rheumatoid arthritis, presents with persistent tiredness despite getting sleep at night. She reports a history of rapid heartbeat and an abnormal EKG, but denies any known heart disease. The patient is a current smoker, trying to quit, and has a history of heavy smoking.  The patient recently recovered from pneumonia, treated with Zpak and prednisone. She reports that her lungs still feel "junky" but a recent x-ray did not show any signs of pneumonia. The patient uses a nebulizer machine and inhalers (albuterol and Combivent) for her breathing but reports getting dizzy with the nebulizer.  The patient also reports having sleep issues. She has trouble falling asleep and  wakes up frequently at night to urinate. Despite getting sleep, she reports feeling tired all the time. The patient snores and has been told she stops breathing in her sleep. She takes Flexeril at night to help with muscle relaxation.     Prior to Admission medications   Medication Sig Start Date End Date Taking? Authorizing Provider  acetaminophen (TYLENOL) 500 MG tablet Take 1,000 mg by mouth daily as needed for mild pain.    Yes [provider]  albuterol (PROVENTIL) (2.5 MG/3ML) 0.083% nebulizer solution Take 2.5 mg by nebulization every 6 (six) hours as needed for wheezing or shortness of breath.   Yes [provider]  albuterol (VENTOLIN HFA) 108 (90 Base) MCG/ACT inhaler Inhale 1-2 puffs into the lungs every 6 (six) hours as needed for wheezing or shortness of breath. 07/31/21  Yes Particia Nearing, PA-C  alendronate (FOSAMAX) 70 MG tablet Take 70 mg by mouth once a week. 09/15/22  Yes [provider]  atenolol (TENORMIN) 50 MG tablet Take 50 mg by mouth daily. 11/05/23  Yes [provider]  BELBUCA 75 MCG FILM Take 75 mcg by mouth 2 (two) times daily. 11/20/23  Yes [provider]  budeson-glycopyrrolate-formoterol (BREZTRI AEROSPHERE) 160-9-4.8 MCG/ACT AERO Inhale 2 puffs into the lungs in the morning and at bedtime. 12/11/23  Yes Khayri Kargbo, Joni Fears D, MD  cetirizine (ZYRTEC ALLERGY) 10 MG tablet Take 1 tablet (10 mg total) by mouth daily. 07/31/21  Yes Particia Nearing, PA-C  Cholecalciferol (VITAMIN D) 2000 units CAPS Take 1 capsule by mouth daily.   Yes [provider]  Cholecalciferol 5000 units capsule Take 1 capsule (5,000 Units total) by mouth daily. 11/24/15  Yes Adline Potter, NP  cyclobenzaprine (FLEXERIL) 10 MG tablet Take 10 mg by mouth 3 (three) times daily as needed. 07/22/21  Yes [provider]  famotidine (PEPCID) 40 MG tablet Take 40 mg by mouth daily. 11/28/23  Yes [provider]  fluticasone  (FLONASE) 50 MCG/ACT nasal spray Place 1 spray into both nostrils 2 (two) times daily. 07/31/21  Yes Particia Nearing, PA-C  folic acid (FOLVITE) 1 MG tablet Take 1 tablet by mouth daily. 07/09/19  Yes [provider]  glipiZIDE (GLUCOTROL) 10 MG tablet Take 10 mg by mouth daily. 11/05/23  Yes [provider]  hydroxychloroquine (PLAQUENIL) 200 MG tablet Take 400 mg by mouth daily.   Yes [provider]  Ipratropium-Albuterol (COMBIVENT RESPIMAT) 20-100 MCG/ACT AERS respimat Inhale 1 puff into the lungs every 6 (six) hours as needed for wheezing or shortness of breath. 10/23/18  Yes [provider]  methotrexate (50 MG/ML) 1 g injection Inject into the vein once a week.   Yes [provider]  oxyCODONE (OXY IR/ROXICODONE) 5 MG immediate release tablet Take 7.5 mg by mouth every 6 (six) hours as needed. pain   Yes [provider]  OZEMPIC, 2 MG/DOSE, 8 MG/3ML SOPN Inject 2 mg into the skin once a week. 11/13/23  Yes [provider]  predniSONE (DELTASONE) 10 MG tablet Take 10 mg by mouth daily with breakfast.   Yes [provider]  pregabalin (LYRICA) 150 MG capsule Take 150 mg by mouth 3 (three) times daily. 07/01/21  Yes [provider]  simvastatin (ZOCOR) 10 MG tablet Take 10 mg by mouth at bedtime. 06/11/21  Yes [provider]  amoxicillin-clavulanate (AUGMENTIN) 875-125 MG tablet Take 1 tablet by mouth 2 (two) times daily for 7 days. 12/14/23 12/21/23  Valentino Nose, NP  BELBUCA 150 MCG FILM Take 150 mcg by mouth 2 (two) times daily. Patient not taking: Reported on 12/11/2023 12/07/23   [provider]  doxycycline (VIBRAMYCIN) 100 MG capsule Take 1 capsule (100 mg total) by mouth 2 (two) times daily for 7 days. 12/14/23 12/21/23  Valentino Nose, NP  predniSONE (STERAPRED UNI-PAK 21 TAB) 10 MG (21) TBPK tablet Take per package instructions 12/14/23   Valentino Nose, NP   promethazine-dextromethorphan (PROMETHAZINE-DM) 6.25-15 MG/5ML syrup Take 5 mLs by mouth 4 (four) times daily as needed. Patient not taking: Reported on 12/11/2023 11/28/23   Particia Nearing, PA-C  Cimetidine (HEARTBURN RELIEF PO) Take 1 tablet by mouth daily as needed (heartburn).  05/31/20  [provider]   Past Medical History:  Diagnosis Date   Anemia    Anxiety    Arthritis    Asthma    Carpal tunnel syndrome, bilateral 11/22/2015   Will try wrist splints   Community acquired pneumonia    COPD (chronic obstructive pulmonary disease) (HCC)    Decreased libido 03/26/2015   Diabetes mellitus without complication (HCC)    prediabetes   Dyslipidemia 11/24/2015   Empty sella (HCC)    Fibromyalgia    Fibromyalgia    Hot flashes 03/26/2015   Hyperlipidemia    Kidney stones    Productive cough    smoker   Renal disorder    Vitamin D deficiency 11/24/2015   Past Surgical History:  Procedure Laterality Date   ABDOMINAL HYSTERECTOMY     has ovaries.    BREAST CYST EXCISION     CESAREAN SECTION  2006   [facility]   LAPAROSCOPIC CHOLECYSTECTOMY  2002   [facility]   LAPAROSCOPIC  HYSTERECTOMY  04/12/2011   Procedure: HYSTERECTOMY TOTAL LAPAROSCOPIC;  Surgeon: Lazaro Arms, MD;  Location: [facility];  Service: Gynecology;  Laterality: N/A;   THYROIDECTOMY, PARTIAL  2012   [facility], [facility]   Family History  Problem Relation Age of Onset   Breast cancer Mother    Diabetes Father    Heart failure Father    Migraines Sister    Scoliosis Daughter    ADD / ADHD Son    Cancer Maternal Grandfather        pancreatic   Diabetes Paternal Grandmother    Congestive Heart Failure Paternal Grandmother    ADD / ADHD Daughter    Scoliosis Daughter    Hyperlipidemia Daughter    Diabetes Daughter        prediabetes   Bipolar disorder Son    ADD / ADHD Son    Social History   Socioeconomic History   Marital status: Legally Separated    Spouse name: Not on file   Number of children: 4    Years of education: 11th grade   Highest education level: Not on file  Occupational History   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 Eats all food groups. Wear seatbelt.   Right-handed.   3 cans of soda per day.   Social Drivers of Corporate investment banker Strain: Not on file  Food Insecurity: No Food Insecurity (01/25/2018)   Hunger Vital Sign    Worried About Running Out of Food in the Last Year: Never true    Ran Out of Food in the Last Year: Never true  Transportation Needs: Unmet Transportation Needs (01/25/2018)   PRAPARE - Administrator, Civil Service (Medical): Yes    Lack of Transportation (Non-Medical): Yes  Physical Activity: Unknown (01/25/2018)   Exercise Vital Sign    Days of Exercise per Week: 3 days    Minutes of Exercise per Session: Not on file  Stress: Stress Concern Present (01/25/2018)   [employer] of Occupational Health - Occupational Stress Questionnaire    Feeling of Stress : Rather much  Social Connections: Unknown (03/07/2023)   Received from [facility]   Social Network    Social Network: Not on file  Intimate Partner Violence: Unknown (03/07/2023)   Received from [facility]   HITS    Physically Hurt: Not on file    Insult or Talk Down To: Not on file    Threaten Physical Harm: Not on file    Scream or Curse: Not on file   ROS-see HPI   + = positive Constitutional:    weight loss, night sweats, fevers, chills, fatigue, lassitude. HEENT:     +headaches, difficulty swallowing, tooth/dental problems, sore throat,       sneezing, itching, ear ache, nasal congestion, post nasal drip, snoring CV:    chest pain, orthopnea, PND, swelling in lower extremities, anasarca,  dizziness, palpitations Resp:   shortness of breath with exertion or at rest.                productive cough,   non-productive cough, coughing up of blood.              change in color of mucus.  wheezing.   Skin:    rash or lesions. GI:  No-   heartburn, indigestion, abdominal pain, nausea, vomiting, diarrhea,                 change in bowel habits, loss of appetite GU: dysuria, change in color of urine, no urgency or frequency.   flank pain. MS:   joint pain, stiffness, decreased range of motion, back pain. Neuro-     nothing unusual Psych:  change in mood or affect.  depression or anxiety.   memory loss.  OBJ- Physical Exam General- Alert, Oriented, Affect-appropriate, Distress- none acute, +obese/short Skin- rash-none, lesions- none, excoriation- none, +sunburn arms  Lymphadenopathy- none Head- atraumatic            Eyes- Gross vision intact, PERRLA, conjunctivae and secretions clear            Ears- Hearing, canals-normal            Nose- Clear, no-Septal dev, mucus, polyps, erosion, perforation             Throat- Mallampati IV , mucosa clear , drainage- none, tonsils- absent, +teeth Neck- flexible , trachea midline, no stridor , thyroid nl, carotid no bruit Chest - symmetrical excursion , unlabored           Heart/CV- RRR , no murmur , no gallop  , no rub, nl s1 s2                           - JVD- none , edema- none, stasis changes- none, varices- none           Lung- clear to P&A, wheeze- none, cough- none , dullness-none, rub- none           Chest wall-  Abd-  Br/ Gen/ Rectal- Not done, not indicated Extrem- cyanosis- none, clubbing, none, atrophy- none, strength- nl Neuro- grossly intact to observation  Assessment  and Plan:    Chronic Obstructive Pulmonary Disease (COPD) exacerbation Significant wheezing and chronic respiratory symptoms. Smoking history is a risk factor. Albuterol and Combivent used; nebulizer rarely due to dizziness. - Provide Breztri samples for trial to improve respiratory control. - Discuss Ohtuvayre nebulizer medication twice daily, pending manufacturer's assistance due to cost. - Consider home oxygen therapy in the future if needed. - Encourage smoking cessation to improve respiratory health.  Sleep Apnea Symptoms suggestive of sleep apnea with potential cardiovascular and metabolic risks. - Order a home sleep test to evaluate for sleep apnea. - Instruct her to follow up in two weeks if she has not received results from the sleep study.  Rheumatoid Arthritis On low dose prednisone and Flexeril for muscle relaxation. - Continue current prednisone regimen. - Continue Flexeril at night for muscle relaxation.    Rheumatology manages

## 2023-12-11 ENCOUNTER — Encounter: Payer: Self-pay | Admitting: Internal Medicine

## 2023-12-11 ENCOUNTER — Ambulatory Visit: Payer: Medicaid Other | Admitting: Internal Medicine

## 2023-12-11 VITALS — BP 126/78 | HR 110 | Temp 98.8°F | Ht <= 58 in | Wt 190.0 lb

## 2023-12-11 DIAGNOSIS — J441 Chronic obstructive pulmonary disease with (acute) exacerbation: Secondary | ICD-10-CM

## 2023-12-11 DIAGNOSIS — M069 Rheumatoid arthritis, unspecified: Secondary | ICD-10-CM

## 2023-12-11 DIAGNOSIS — R0683 Snoring: Secondary | ICD-10-CM | POA: Diagnosis not present

## 2023-12-11 DIAGNOSIS — F17210 Nicotine dependence, cigarettes, uncomplicated: Secondary | ICD-10-CM | POA: Diagnosis not present

## 2023-12-11 MED ORDER — BREZTRI AEROSPHERE 160-9-4.8 MCG/ACT IN AERO: 2.0000 | INHALATION_SPRAY | Freq: Two times a day (BID) | RESPIRATORY_TRACT | Status: DC

## 2023-12-11 NOTE — Patient Instructions (Signed)
 Order- sample x 2 Breztri inhaler    inhale 2 puffs then rinse mouth, twice daily Try this instead of combivent.  Ok to use your regula breathing meds as before, if needed  Order- application for Ohtuvayre neb solution     1 neb twice daily every day  Order- Schedule home sleep test   dx snoring  Please call me for results and recommendations about 2 weeks after your sleep test

## 2023-12-13 ENCOUNTER — Telehealth: Payer: Self-pay | Admitting: Pharmacist

## 2023-12-13 NOTE — Telephone Encounter (Signed)
 Received new start paperwork for [facility]. Submission is pending OV note to be signed. Scanned to Onbase with insurance card copy  Chesley Mires, PharmD, MPH, BCPS, CPP Clinical Pharmacist (Rheumatology and Pulmonology)

## 2023-12-14 ENCOUNTER — Other Ambulatory Visit: Payer: Self-pay

## 2023-12-14 ENCOUNTER — Encounter: Payer: Self-pay | Admitting: Emergency Medicine

## 2023-12-14 ENCOUNTER — Ambulatory Visit

## 2023-12-14 ENCOUNTER — Ambulatory Visit
Admission: EM | Admit: 2023-12-14 | Discharge: 2023-12-14 | Disposition: A | Discharge status: Home / Self Care | Attending: Nurse Practitioner | Admitting: Nurse Practitioner

## 2023-12-14 DIAGNOSIS — J441 Chronic obstructive pulmonary disease with (acute) exacerbation: Secondary | ICD-10-CM

## 2023-12-14 MED ORDER — PREDNISONE 10 MG (21) PO TBPK: ORAL_TABLET | ORAL | 0 refills | Status: DC

## 2023-12-14 MED ORDER — IPRATROPIUM BROMIDE 0.02 % IN SOLN
0.5000 mg | Freq: Once | RESPIRATORY_TRACT | Status: AC
  Administered 2023-12-14: 0.5 mg via RESPIRATORY_TRACT

## 2023-12-14 MED ORDER — AMOXICILLIN-POT CLAVULANATE 875-125 MG PO TABS: 1.0000 | ORAL_TABLET | Freq: Two times a day (BID) | ORAL | 0 refills | Status: AC

## 2023-12-14 MED ORDER — DOXYCYCLINE HYCLATE 100 MG PO CAPS: 100.0000 mg | ORAL_CAPSULE | Freq: Two times a day (BID) | ORAL | 0 refills | Status: AC

## 2023-12-14 NOTE — ED Provider Notes (Addendum)
 [facility]    CSN: [id number] Arrival date & time: 12/14/23  1537      History   Chief Complaint Chief Complaint  Patient presents with   Shortness of Breath    HPI Cassandra Lucas is a 48 y.o. female.   Patient presents today with 5 to 6-day history of congested cough, shortness of breath, wheezing, and chest tightness when she takes a deep breath.  She denies fevers, body aches or chills, abdominal pain, nausea/vomiting, or diarrhea.  She reports she is coughing up thick, discolored mucus and has history of COPD.  She is currently taking Breztri and using albuterol inhaler every 4 hours around-the-clock for her breathing.  Reports her pulmonologist is working on getting her a nebulizer medicine for the COPD because of Markus Daft is not working well.  Reports she was treated for "pneumonia" on 11/28/23 with a zpack and never fully improved.      Past Medical History:  Diagnosis Date   Anemia    Anxiety    Arthritis    Asthma    Carpal tunnel syndrome, bilateral 11/22/2015   Will try wrist splints   Community acquired pneumonia    COPD (chronic obstructive pulmonary disease) (HCC)    Decreased libido 03/26/2015   Diabetes mellitus without complication (HCC)    prediabetes   Dyslipidemia 11/24/2015   Empty sella (HCC)    Fibromyalgia    Fibromyalgia    Hot flashes 03/26/2015   Hyperlipidemia    Kidney stones    Productive cough    smoker   Renal disorder    Vitamin D deficiency 11/24/2015    Patient Active Problem List   Diagnosis Date Noted   Asthma 07/31/2021   Fibromyalgia 06/23/2020   High risk medication use 06/23/2020   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    BREAST CYST EXCISION     CESAREAN SECTION  2006   [facility]   LAPAROSCOPIC CHOLECYSTECTOMY  2002   [facility]   LAPAROSCOPIC HYSTERECTOMY  04/12/2011   Procedure: HYSTERECTOMY TOTAL LAPAROSCOPIC;  Surgeon: Lazaro Arms, MD;  Location: [facility];  Service: Gynecology;  Laterality: N/A;   THYROIDECTOMY, PARTIAL  2012   [facility], [facility]    OB History     Gravida  4   Para  4   Term  0   Preterm  0   AB  0   Living  4      SAB  0   IAB  0   Ectopic  0   Multiple  0   Live Births               Home Medications    Prior to Admission medications   Medication Sig Start Date End Date Taking? Authorizing Provider  amoxicillin-clavulanate (AUGMENTIN) 875-125 MG tablet Take 1 tablet by mouth 2 (two) times daily for 7 days. 12/14/23  12/21/23 Yes Valentino Nose, NP  doxycycline (VIBRAMYCIN) 100 MG capsule Take 1 capsule (100 mg total) by mouth 2 (two) times daily for 7 days. 12/14/23 12/21/23 Yes Valentino Nose, NP  predniSONE (STERAPRED UNI-PAK 21 TAB) 10 MG (21) TBPK tablet Take per package instructions 12/14/23  Yes Cathlean Marseilles A, NP  acetaminophen (TYLENOL) 500 MG tablet Take 1,000 mg by mouth daily as needed for mild pain.     [provider]  albuterol (PROVENTIL) (2.5 MG/3ML) 0.083% nebulizer solution Take 2.5 mg by nebulization every 6 (six) hours as needed for wheezing or shortness of breath.    [provider]  albuterol (VENTOLIN HFA) 108 (90 Base) MCG/ACT inhaler Inhale 1-2 puffs into the lungs every 6 (six) hours as needed for wheezing or shortness of breath. 07/31/21   Particia Nearing, PA-C  alendronate (FOSAMAX) 70 MG tablet Take 70 mg by mouth  once a week. 09/15/22   [provider]  atenolol (TENORMIN) 50 MG tablet Take 50 mg by mouth daily. 11/05/23   [provider]  BELBUCA 150 MCG FILM Take 150 mcg by mouth 2 (two) times daily. Patient not taking: Reported on 12/11/2023 12/07/23   [provider]  BELBUCA 75 MCG FILM Take 75 mcg by mouth 2 (two) times daily. 11/20/23   [provider]  budeson-glycopyrrolate-formoterol (BREZTRI AEROSPHERE) 160-9-4.8 MCG/ACT AERO Inhale 2 puffs into the lungs in the morning and at bedtime. 12/11/23   Waymon Budge, MD  cetirizine (ZYRTEC ALLERGY) 10 MG tablet Take 1 tablet (10 mg total) by mouth daily. 07/31/21   Particia Nearing, PA-C  Cholecalciferol (VITAMIN D) 2000 units CAPS Take 1 capsule by mouth daily.    [provider]  Cholecalciferol 5000 units capsule Take 1 capsule (5,000 Units total) by mouth daily. 11/24/15   Adline Potter, NP  cyclobenzaprine (FLEXERIL) 10 MG tablet Take 10 mg by mouth 3 (three) times daily as needed. 07/22/21   [provider]  famotidine (PEPCID) 40 MG tablet Take 40 mg by mouth daily. 11/28/23   [provider]  fluticasone (FLONASE) 50 MCG/ACT nasal spray Place 1 spray into both nostrils 2 (two) times daily. 07/31/21   Particia Nearing, PA-C  folic acid (FOLVITE) 1 MG tablet Take 1 tablet by mouth daily. 07/09/19   [provider]  glipiZIDE (GLUCOTROL) 10 MG tablet Take 10 mg by mouth daily. 11/05/23   [provider]  hydroxychloroquine (PLAQUENIL) 200 MG tablet Take 400 mg by mouth daily.    [provider]  Ipratropium-Albuterol (COMBIVENT RESPIMAT) 20-100 MCG/ACT AERS respimat Inhale 1 puff into the lungs every 6 (six) hours as needed for wheezing or shortness of breath. 10/23/18   [provider]  methotrexate (50 MG/ML) 1 g injection Inject into the vein once a week.    [provider]  oxyCODONE (OXY IR/ROXICODONE) 5 MG immediate release  tablet Take 7.5 mg by mouth every 6 (six) hours as needed. pain    [provider]  OZEMPIC, 2 MG/DOSE, 8 MG/3ML SOPN Inject 2 mg into the skin once a week. 11/13/23   [provider]  predniSONE (DELTASONE) 10 MG tablet Take 10 mg by mouth daily with breakfast.    [provider]  pregabalin (LYRICA) 150 MG capsule Take 150 mg by mouth 3 (three) times daily. 07/01/21   [provider]  promethazine-dextromethorphan (PROMETHAZINE-DM) 6.25-15 MG/5ML syrup Take 5 mLs by mouth 4 (four) times daily as needed. Patient  not taking: Reported on 12/11/2023 11/28/23   Particia Nearing, PA-C  simvastatin (ZOCOR) 10 MG tablet Take 10 mg by mouth at bedtime. 06/11/21   [provider]  Cimetidine (HEARTBURN RELIEF PO) Take 1 tablet by mouth daily as needed (heartburn).  05/31/20  [provider]    Family History Family History  Problem Relation Age of Onset   Breast cancer Mother    Diabetes Father    Heart failure Father    Migraines Sister    Scoliosis Daughter    ADD / ADHD Son    Cancer Maternal Grandfather        pancreatic   Diabetes Paternal Grandmother    Congestive Heart Failure Paternal Grandmother    ADD / ADHD Daughter    Scoliosis Daughter    Hyperlipidemia Daughter    Diabetes Daughter        prediabetes   Bipolar disorder Son    ADD / ADHD Son     Social History Social History   Tobacco Use   Smoking status: Every Day    Current packs/day: 0.25    Average packs/day: 0.3 packs/day for 17.0 years (4.3 ttl pk-yrs)    Types: Cigarettes   Smokeless tobacco: Never  Vaping Use   Vaping status: Former  Substance Use Topics   Alcohol use: No   Drug use: No     Allergies   Nsaids, Aspirin, Flagyl [metronidazole], and Macrobid [nitrofurantoin macrocrystal]   Review of Systems Review of Systems Per HPI  Physical Exam Triage Vital Signs ED Triage Vitals  Encounter Vitals Group     BP 12/14/23 1621 128/89      Systolic BP Percentile --      Diastolic BP Percentile --      Pulse Rate 12/14/23 1621 (!) 134     Resp 12/14/23 1621 (!) 22     Temp 12/14/23 1621 98.4 F (36.9 C)     Temp Source 12/14/23 1621 Oral     SpO2 12/14/23 1621 95 %     Weight --      Height --      Head Circumference --      Peak Flow --      Pain Score 12/14/23 1618 4     Pain Loc --      Pain Education --      Exclude from Growth Chart --    No data found.  Updated Vital Signs BP 128/89 (BP Location: Right Arm)   Pulse (S) (!) 127 Comment: after neb tx  Temp 98.4 F (36.9 C) (Oral)   Resp (!) 22   LMP 01/22/2011   SpO2 (S) 96% Comment: after neb tx  Visual Acuity Right Eye Distance:   Left Eye Distance:   Bilateral Distance:    Right Eye Near:   Left Eye Near:    Bilateral Near:     Physical Exam Vitals and nursing note reviewed.  Constitutional:      General: She is not in acute distress.    Appearance: Normal appearance. She is not ill-appearing or toxic-appearing.  HENT:     Head: Normocephalic and atraumatic.     Right Ear: Tympanic membrane, ear canal and external ear normal.     Left Ear: Tympanic membrane, ear canal and external ear normal.     Nose: Congestion present. No rhinorrhea.     Mouth/Throat:     Mouth: Mucous membranes are moist.     Pharynx: Oropharynx is clear.  Posterior oropharyngeal erythema present. No oropharyngeal exudate.  Eyes:     General: No scleral icterus.    Extraocular Movements: Extraocular movements intact.  Cardiovascular:     Rate and Rhythm: Regular rhythm. Tachycardia present.  Pulmonary:     Effort: Pulmonary effort is normal. No respiratory distress.     Breath sounds: Decreased breath sounds and wheezing present. No rhonchi or rales.  Musculoskeletal:     Cervical back: Normal range of motion and neck supple.  Lymphadenopathy:     Cervical: No cervical adenopathy.  Skin:    General: Skin is warm and dry.     Capillary Refill: Capillary refill  takes less than 2 seconds.     Coloration: Skin is not jaundiced or pale.     Findings: No erythema or rash.  Neurological:     Mental Status: She is alert and oriented to person, place, and time.  Psychiatric:        Behavior: Behavior is cooperative.      UC Treatments / Results  Labs (all labs ordered are listed, but only abnormal results are displayed) Labs Reviewed - No data to display  EKG   Radiology DG Chest 2 View Result Date: 12/14/2023 CLINICAL DATA:  Cough, history of COPD. EXAM: CHEST - 2 VIEW COMPARISON:  Radiograph and CT 10/09/2021 FINDINGS: The cardiomediastinal contours are normal. Bronchial thickening. Pulmonary vasculature is normal. No consolidation, pleural effusion, or pneumothorax. Mild midthoracic superior endplate compression deformity is age indeterminate, but new from January 2023. IMPRESSION: 1. Bronchial thickening. No consolidation. 2. Mild midthoracic superior endplate compression deformity is age indeterminate, but new from January 2023. Recommend correlation for history of back pain. Electronically Signed   By: Narda Rutherford M.D.   On: 12/14/2023 17:26    Procedures Procedures (including critical care time)  Medications Ordered in UC Medications  ipratropium (ATROVENT) nebulizer solution 0.5 mg (0.5 mg Nebulization Given 12/14/23 1707)    Initial Impression / Assessment and Plan / UC Course  I have reviewed the triage vital signs and the nursing notes.  Pertinent labs & imaging results that were available during my care of the patient were reviewed by me and considered in my medical decision making (see chart for details).   In triage, patient is tachycardic and tachypneic, otherwise vital signs are stable.  1. COPD exacerbation (HCC) EKG today shows sinus tachycardia.  Ventricular rate 131 bpm without significant ST segment T wave changes when compared with previous EKG Ipratropium nebulizer given with improvement in air movement, SpO2  increased to 96% on room air Chest x-ray obtained to evaluate for pneumonia-if there is pneumonia, would likely need to have repeat imaging within 6 weeks to ensure full resolution Upon my independent review, I do not appreciate any obvious lung consolidation when compared with previous chest x-ray Radiologist over read shows no pneumonia of chest x-ray today; no change to treatment plan at this time In meantime, given history and comorbidities, will treat with doxycycline twice daily for 7 days along with Augmentin twice daily for 7 days Start oral prednisone taper for lung inflammation Pulmonary hygiene discussed including guaifenesin, antitussives as needed Return and ER precautions discussed with patient   The patient was given the opportunity to ask questions.  All questions answered to their satisfaction.  The patient is in agreement to this plan.    Final Clinical Impressions(s) / UC Diagnoses   Final diagnoses:  COPD exacerbation [facility])     Discharge Instructions  I am concerned you may be having an exacerbation of the COPD.  We gave you a breathing treatment today to help with congestion in your lungs.  We have obtained a chest x-ray to evaluate for pneumonia and will contact you with abnormal results.  In the meantime, continue using the albuterol inhaler every 4-6 hours scheduled for the next 2 days, then use as needed.  Start taking the oral prednisone to help with lung inflammation.  In addition, recommend taking doxycycline and Augmentin to treat for lung infection.  Start guaifenesin 600 mg twice daily to help loosen the congestion and encourage deep breathing exercises 10 times an hour every hour while awake.  Symptoms should improve over the next few days.  If symptoms worsen despite treatment, please return for reevaluation or follow-up in the ER.     ED Prescriptions     Medication Sig Dispense Auth. Provider   predniSONE (STERAPRED UNI-PAK 21 TAB) 10 MG (21) TBPK  tablet Take per package instructions 21 each Cathlean Marseilles A, NP   doxycycline (VIBRAMYCIN) 100 MG capsule Take 1 capsule (100 mg total) by mouth 2 (two) times daily for 7 days. 14 capsule Cathlean Marseilles A, NP   amoxicillin-clavulanate (AUGMENTIN) 875-125 MG tablet Take 1 tablet by mouth 2 (two) times daily for 7 days. 14 tablet Valentino Nose, NP      PDMP not reviewed this encounter.   Valentino Nose, NP 12/14/23 1729    Valentino Nose, NP 12/14/23 [phone]

## 2023-12-14 NOTE — Discharge Instructions (Signed)
 I am concerned you may be having an exacerbation of the COPD.  We gave you a breathing treatment today to help with congestion in your lungs.  We have obtained a chest x-ray to evaluate for pneumonia and will contact you with abnormal results.  In the meantime, continue using the albuterol inhaler every 4-6 hours scheduled for the next 2 days, then use as needed.  Start taking the oral prednisone to help with lung inflammation.  In addition, recommend taking doxycycline and Augmentin to treat for lung infection.  Start guaifenesin 600 mg twice daily to help loosen the congestion and encourage deep breathing exercises 10 times an hour every hour while awake.  Symptoms should improve over the next few days.  If symptoms worsen despite treatment, please return for reevaluation or follow-up in the ER.

## 2023-12-14 NOTE — ED Triage Notes (Addendum)
 Pt reports seen for similar on 3/12 and reports symptoms got better but reports cough, dyspnea returned approx 5-6 days ago. Pt reports "I keep pneumonia year round." Has been using rescue inhaler every 4 hours and reports last use 1pm.

## 2023-12-15 ENCOUNTER — Encounter: Payer: Self-pay | Admitting: Internal Medicine

## 2023-12-17 NOTE — Telephone Encounter (Signed)
 [place] form faxed with clinicals and insurance card copy to [employer]   Phone#: [phone] Fax#: [phone]

## 2023-12-25 NOTE — Telephone Encounter (Signed)
 Submitted a Prior Authorization request to [facility] for [facility] via CoverMyMeds. Will update once we receive a response.  Key: [id number]

## 2023-12-25 NOTE — Telephone Encounter (Signed)
 Received notification from [facility] regarding a prior authorization for [facility]. Authorization has been APPROVED from 12/25/2023 to 12/24/2024. Approval letter sent to scan center.  Authorization # [id number]  Chesley Mires, PharmD, MPH, BCPS, CPP Clinical Pharmacist (Rheumatology and Pulmonology)

## 2023-12-26 ENCOUNTER — Ambulatory Visit: Payer: Medicaid Other | Admitting: Podiatry

## 2024-01-14 ENCOUNTER — Ambulatory Visit: Admitting: Podiatry

## 2024-01-14 ENCOUNTER — Encounter: Payer: Self-pay | Admitting: Podiatry

## 2024-01-14 DIAGNOSIS — M722 Plantar fascial fibromatosis: Secondary | ICD-10-CM

## 2024-01-14 MED ORDER — METHYLPREDNISOLONE 4 MG PO TBPK: ORAL_TABLET | ORAL | 0 refills | Status: DC

## 2024-01-14 NOTE — Progress Notes (Signed)
  Subjective:  Patient ID: Cassandra Lucas, female    DOB: August 25, 1976,   MRN: [id number]  No chief complaint on file.   48 y.o. female presents for follow-up of bilateral plantar fasciiia. Relates the shot helped for a while but then beliefs she had an allergic reaction to it and had a rash all over her body. Still in a lot of pain. Denied for use of [place].  Gets pain all over her body and relates worsened because has gone down on steroid dose. 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 Also has history of fibromyalgia. Patient is diabetic and last A1c was  Lab Results  Component Value Date   HGBA1C 6.4 (H) 01/28/2018   .  PCP:  [facility]    . Denies any other pedal complaints. Denies n/v/f/c.   Past Medical History:  Diagnosis Date   Anemia    Anxiety    Arthritis    Asthma    Carpal tunnel syndrome, bilateral 11/22/2015   Will try wrist splints   Community acquired pneumonia    COPD (chronic obstructive pulmonary disease) (HCC)    Decreased libido 03/26/2015   Diabetes mellitus without complication (HCC)    prediabetes   Dyslipidemia 11/24/2015   Empty sella (HCC)    Fibromyalgia    Fibromyalgia    Hot flashes 03/26/2015   Hyperlipidemia    Kidney stones    Productive cough    smoker   Renal disorder    Vitamin D  deficiency 11/24/2015    Objective:  Physical Exam: Vascular: DP/PT pulses 2/4 bilateral. CFT <3 seconds. Absent hair growth on digits. Edema noted to bilateral lower extremities. Xerosis noted bilaterally.  Skin. No lacerations or abrasions bilateral feet. Nails 1-5 bilateral  are normal in appearance.  Musculoskeletal: MMT 5/5 bilateral lower extremities in DF, PF, Inversion and Eversion. Deceased ROM in DF of ankle joint. Tender to the medial calcaneal tubercle bilaterally . No pain with achilles, PT or arch. No pain with calcaneal squeeze.  Neurological: Sensation intact to light touch. Protective sensation  diminished bilateral.    Assessment:   1. Plantar fasciitis of left foot   2. Bilateral plantar fasciitis       Plan:  Patient was evaluated and treated and all questions answered. Discussed plantar fasciitis with patient.  X-rays reviewed and discussed with patient. No acute fractures or dislocations noted. Mild spurring noted at inferior calcaneus.  Discussed treatment options including, ice, NSAIDS, supportive shoes, bracing, and stretching. Continue stretching and brace.  Will avoid steroid shots concern for allergic reaction to marcaine  Will order MRI to further evaluate.  Medrol  dose pack for releif for now.   Discussed neuropathy and etiology as well as treatment with patient.  Radiographs reviewed and discussed with patient.  -Discussed and educated patient on diabetic foot care, especially with  regards to the vascular, neurological and musculoskeletal systems.  -Stressed the importance of good glycemic control and the detriment of not  controlling glucose levels in relation to the foot. -Discussed supportive shoes at all times and checking feet regularly.  -Was unable to get approval for Q[place]za  currently.  Follow-up after MRI    Jennefer Moats, DPM

## 2024-01-29 ENCOUNTER — Telehealth: Payer: Self-pay

## 2024-01-29 NOTE — Telephone Encounter (Signed)
 [facility] called stating that PA for MRI was denied due to no documentation of patient  having 6 months of conservative treatment as well as any type of modifications to shoes or immobilization.   They would like to know if you want to do a P2P. A P2P can be done by calling [phone] and using ref# [id number]

## 2024-01-30 ENCOUNTER — Encounter: Payer: Self-pay | Admitting: Podiatry

## 2024-01-30 ENCOUNTER — Ambulatory Visit

## 2024-01-30 DIAGNOSIS — R0683 Snoring: Secondary | ICD-10-CM

## 2024-02-01 ENCOUNTER — Other Ambulatory Visit

## 2024-02-08 DIAGNOSIS — G4733 Obstructive sleep apnea (adult) (pediatric): Secondary | ICD-10-CM | POA: Diagnosis not present

## 2024-04-10 NOTE — Progress Notes (Signed)
 Rheumatology at [facility] Date of Service: 04/10/2024 Patient Name: Cassandra Lucas Patient DOB: Nov 19, 1975  Vital Signs BP 114/81 (BP Location: Left arm, Patient Position: Sitting)   Pulse (!) 125   Ht 1.448 m (4' 9)   Wt 82.3 kg (181 lb 6.4 oz)   SpO2 93%   BMI 39.25 kg/m  CC/HPI Cassandra Lucas is here for a follow up for RA Monitoring of chronic therapy.  No hot, swollen joints. Pt has left kidney stone, symptomatic for a few weeks.   History: She was diagnosed with RA in 2020,  No hot, swollen joints.   Serologies: 06/08/2022 Hep.C-neg Hep.B SA-neg  Rheumatology medications: Leflunomide  10 mg a day,  prednisone  10 mg a day, plaquenil  400 mg a day, alendronate 70 mg weekly She tolerates tx. She is unable to decrease prednisone  dose. She is on Oxicodone and Butrans , Lyrica  and Zanaflex [facility]  Methotrexate stopped due to recurrent pneumonia.  Patient is also being treated for: DM, COPD, hypercholesterolemia, HTN, b/l CTS, depression Recent pneumonia, treated with 3 runs of antibiotics. Still wheezing.  Vaccines: Hx of cancers: no Infections: PNA 2-25  Physical Exam Pt is alert and oriented.  Answers questions appropriately  Cushingoid face Heart: normal S1, S2. No S3, no thrills. No JVD. Lungs: no dyspnea observed, wheezing and rales on auscultation.  Extremities: no cyanosis, no edema, no calves tenderness. Skin: warm, dry. Musculoskeletal exam: there is diffuse tenderness in joints, muscles and skin hypersensitivity. No hot, swollen or deformed joints    Assessment 1. Rheumatoid arthritis with rheumatoid factor of multiple sites without organ or systems involvement    (CMD)  hydroxychloroquine  (PLAQUENIL ) 200 mg tablet   leflunomide  (ARAVA ) 10 mg tablet   predniSONE  (DELTASONE ) 5 mg tablet   alendronate (FOSAMAX) 70 mg tablet    2. Arthralgia, unspecified joint  hydroxychloroquine  (PLAQUENIL ) 200 mg tablet   leflunomide  (ARAVA ) 10 mg tablet    predniSONE  (DELTASONE ) 5 mg tablet   alendronate (FOSAMAX) 70 mg tablet    3. Other long term (current) drug therapy  hydroxychloroquine  (PLAQUENIL ) 200 mg tablet   leflunomide  (ARAVA ) 10 mg tablet   predniSONE  (DELTASONE ) 5 mg tablet   alendronate (FOSAMAX) 70 mg tablet     Orders Hold leflunomide  until after the removal of kidney stone Continue prednisone  10 mg a day, plaquenil  400 mg a day, alendronate 70 mg weekly Return in about 4 months (around 08/11/2024).  Electronic signature. I Devere Marcine Shams, RMA am acting as scribe for Dr. Aldona Ziolkowska,04/10/2024, 3:34 PM

## 2024-04-14 ENCOUNTER — Other Ambulatory Visit: Payer: Self-pay

## 2024-04-14 ENCOUNTER — Inpatient Hospital Stay (HOSPITAL_COMMUNITY)
Admission: EM | Admit: 2024-04-14 | Discharge: 2024-04-17 | DRG: 690 | Disposition: A | Discharge status: Home / Self Care | Attending: Internal Medicine | Admitting: Internal Medicine

## 2024-04-14 ENCOUNTER — Encounter (HOSPITAL_COMMUNITY): Payer: Self-pay

## 2024-04-14 ENCOUNTER — Emergency Department (HOSPITAL_COMMUNITY)

## 2024-04-14 DIAGNOSIS — I10 Essential (primary) hypertension: Secondary | ICD-10-CM | POA: Diagnosis present

## 2024-04-14 DIAGNOSIS — E119 Type 2 diabetes mellitus without complications: Secondary | ICD-10-CM | POA: Diagnosis present

## 2024-04-14 DIAGNOSIS — Z555 Less than a high school diploma: Secondary | ICD-10-CM

## 2024-04-14 DIAGNOSIS — Z9049 Acquired absence of other specified parts of digestive tract: Secondary | ICD-10-CM

## 2024-04-14 DIAGNOSIS — Z8249 Family history of ischemic heart disease and other diseases of the circulatory system: Secondary | ICD-10-CM

## 2024-04-14 DIAGNOSIS — F17210 Nicotine dependence, cigarettes, uncomplicated: Secondary | ICD-10-CM | POA: Diagnosis present

## 2024-04-14 DIAGNOSIS — M797 Fibromyalgia: Secondary | ICD-10-CM | POA: Diagnosis present

## 2024-04-14 DIAGNOSIS — N201 Calculus of ureter: Principal | ICD-10-CM | POA: Diagnosis present

## 2024-04-14 DIAGNOSIS — E66813 Obesity, class 3: Secondary | ICD-10-CM | POA: Diagnosis present

## 2024-04-14 DIAGNOSIS — Z7985 Long-term (current) use of injectable non-insulin antidiabetic drugs: Secondary | ICD-10-CM

## 2024-04-14 DIAGNOSIS — N3000 Acute cystitis without hematuria: Secondary | ICD-10-CM

## 2024-04-14 DIAGNOSIS — N136 Pyonephrosis: Principal | ICD-10-CM | POA: Diagnosis present

## 2024-04-14 DIAGNOSIS — Z7951 Long term (current) use of inhaled steroids: Secondary | ICD-10-CM

## 2024-04-14 DIAGNOSIS — M1990 Unspecified osteoarthritis, unspecified site: Secondary | ICD-10-CM | POA: Diagnosis present

## 2024-04-14 DIAGNOSIS — N1330 Unspecified hydronephrosis: Secondary | ICD-10-CM | POA: Diagnosis present

## 2024-04-14 DIAGNOSIS — N390 Urinary tract infection, site not specified: Secondary | ICD-10-CM

## 2024-04-14 DIAGNOSIS — Z833 Family history of diabetes mellitus: Secondary | ICD-10-CM

## 2024-04-14 DIAGNOSIS — Z5982 Transportation insecurity: Secondary | ICD-10-CM

## 2024-04-14 DIAGNOSIS — Z8701 Personal history of pneumonia (recurrent): Secondary | ICD-10-CM

## 2024-04-14 DIAGNOSIS — Z7984 Long term (current) use of oral hypoglycemic drugs: Secondary | ICD-10-CM

## 2024-04-14 DIAGNOSIS — Z6837 Body mass index (BMI) 37.0-37.9, adult: Secondary | ICD-10-CM

## 2024-04-14 DIAGNOSIS — M0579 Rheumatoid arthritis with rheumatoid factor of multiple sites without organ or systems involvement: Secondary | ICD-10-CM | POA: Diagnosis present

## 2024-04-14 DIAGNOSIS — J441 Chronic obstructive pulmonary disease with (acute) exacerbation: Secondary | ICD-10-CM | POA: Diagnosis present

## 2024-04-14 DIAGNOSIS — E785 Hyperlipidemia, unspecified: Secondary | ICD-10-CM | POA: Diagnosis present

## 2024-04-14 DIAGNOSIS — Z635 Disruption of family by separation and divorce: Secondary | ICD-10-CM

## 2024-04-14 DIAGNOSIS — Z7983 Long term (current) use of bisphosphonates: Secondary | ICD-10-CM

## 2024-04-14 DIAGNOSIS — N179 Acute kidney failure, unspecified: Secondary | ICD-10-CM | POA: Diagnosis present

## 2024-04-14 DIAGNOSIS — Z886 Allergy status to analgesic agent status: Secondary | ICD-10-CM

## 2024-04-14 DIAGNOSIS — Z7952 Long term (current) use of systemic steroids: Secondary | ICD-10-CM

## 2024-04-14 DIAGNOSIS — Z87442 Personal history of urinary calculi: Secondary | ICD-10-CM

## 2024-04-14 DIAGNOSIS — R000 Tachycardia, unspecified: Secondary | ICD-10-CM | POA: Insufficient documentation

## 2024-04-14 DIAGNOSIS — Z881 Allergy status to other antibiotic agents status: Secondary | ICD-10-CM

## 2024-04-14 DIAGNOSIS — Z803 Family history of malignant neoplasm of breast: Secondary | ICD-10-CM

## 2024-04-14 DIAGNOSIS — Z90710 Acquired absence of both cervix and uterus: Secondary | ICD-10-CM

## 2024-04-14 DIAGNOSIS — Z79899 Other long term (current) drug therapy: Secondary | ICD-10-CM

## 2024-04-14 LAB — URINALYSIS, ROUTINE W REFLEX MICROSCOPIC
Bilirubin Urine: NEGATIVE
Glucose, UA: NEGATIVE mg/dL
Ketones, ur: NEGATIVE mg/dL
Nitrite: NEGATIVE
Protein, ur: 30 mg/dL — AB
RBC / HPF: >50 RBC/hpf (ref 0–5)
Specific Gravity, Urine: 1.012 (ref 1.005–1.030)
WBC, UA: >50 WBC/hpf (ref 0–5)
pH: 6 (ref 5.0–8.0)

## 2024-04-14 LAB — COMPREHENSIVE METABOLIC PANEL WITH GFR
ALT: 16 U/L (ref 0–44)
AST: 15 U/L (ref 15–41)
Albumin: 3.6 g/dL (ref 3.5–5.0)
Alkaline Phosphatase: 70 U/L (ref 38–126)
Anion gap: 9 (ref 5–15)
BUN: 13 mg/dL (ref 6–20)
CO2: 30 mmol/L (ref 22–32)
Calcium: 9.7 mg/dL (ref 8.9–10.3)
Chloride: 100 mmol/L (ref 98–111)
Creatinine, Ser: 1.24 mg/dL — ABNORMAL HIGH (ref 0.44–1.00)
GFR, Estimated: 54 mL/min — ABNORMAL LOW (ref >60)
Glucose, Bld: 111 mg/dL — ABNORMAL HIGH (ref 70–99)
Potassium: 3.9 mmol/L (ref 3.5–5.1)
Sodium: 139 mmol/L (ref 135–145)
Total Bilirubin: 0.6 mg/dL (ref 0.0–1.2)
Total Protein: 6.5 g/dL (ref 6.5–8.1)

## 2024-04-14 LAB — LIPASE, BLOOD: Lipase: 35 U/L (ref 11–51)

## 2024-04-14 LAB — CBC
HCT: 41.4 % (ref 36.0–46.0)
Hemoglobin: 13.7 g/dL (ref 12.0–15.0)
MCH: 31.1 pg (ref 26.0–34.0)
MCHC: 33.1 g/dL (ref 30.0–36.0)
MCV: 94.1 fL (ref 80.0–100.0)
Platelets: 206 K/uL (ref 150–400)
RBC: 4.4 MIL/uL (ref 3.87–5.11)
RDW: 12.7 % (ref 11.5–15.5)
WBC: 8 K/uL (ref 4.0–10.5)
nRBC: 0 % (ref 0.0–0.2)

## 2024-04-14 MED ORDER — HYDROMORPHONE HCL 1 MG/ML IJ SOLN
0.5000 mg | Freq: Once | INTRAMUSCULAR | Status: AC
  Administered 2024-04-14: 0.5 mg via INTRAVENOUS
  Filled 2024-04-14: qty 0.5

## 2024-04-14 MED ORDER — ONDANSETRON HCL 4 MG/2ML IJ SOLN
4.0000 mg | Freq: Once | INTRAMUSCULAR | Status: AC
  Administered 2024-04-14: 4 mg via INTRAVENOUS
  Filled 2024-04-14: qty 2

## 2024-04-14 MED ORDER — SODIUM CHLORIDE 0.9 % IV SOLN
1.0000 g | Freq: Once | INTRAVENOUS | Status: AC
  Administered 2024-04-14: 1 g via INTRAVENOUS
  Filled 2024-04-14: qty 10

## 2024-04-14 MED ORDER — FENTANYL CITRATE (PF) 100 MCG/2ML IJ SOLN
50.0000 ug | Freq: Once | INTRAMUSCULAR | Status: AC
  Administered 2024-04-15: 50 ug via INTRAVENOUS
  Filled 2024-04-14: qty 2

## 2024-04-14 NOTE — ED Provider Notes (Signed)
 Care assumed at shift change. Here with renal stone, continued pain and dysuria. Awaiting UA and urology consult.  Physical Exam  BP (!) 143/96   Pulse (!) 115   Temp 98.7 F (37.1 C)   Resp 16   LMP 01/22/2011   SpO2 93%   Physical Exam  Procedures  Procedures  ED Course / MDM   Clinical Course as of 04/15/24 0002  Mon Apr 14, 2024  2334 UA with blood and signs of infection. Will send for culture, begin Abx and discuss with Urology.  [CS]  2345 Spoke with Dr. Roseann, who will review imaging and call back with recommendations.  [CS]  Tue Apr 15, 2024  0000 Spoke again with Dr. Roseann, he requests the patient be sent to [facility] for possible stenting. Patient's family will drive her there. Abx are complete. Dr. Raford aware.  [CS]    Clinical Course User Index [CS] Roselyn Carlin NOVAK, MD   Medical Decision Making Problems Addressed: Lower urinary tract infectious disease: acute illness or injury Ureterolithiasis: acute illness or injury  Amount and/or Complexity of Data Reviewed Labs:  Decision-making details documented in ED Course.  Risk Prescription drug management. Parenteral controlled substances. Decision regarding hospitalization.          Roselyn Carlin NOVAK, MD 04/15/24 0001

## 2024-04-14 NOTE — ED Triage Notes (Addendum)
 Pt c/o kidney stones diagnosed x few weeks ago and states that the left one is too big to pass. Pt had blood work done and states she was told her kidney function was worse. Endorses left flank pain and nausea and feeling unwell.

## 2024-04-14 NOTE — ED Provider Notes (Signed)
 [place] EMERGENCY DEPARTMENT AT [facility] Provider Note   CSN: [id number] Arrival date & time: 04/14/24  2045     Patient presents with: No chief complaint on file.   Cassandra Lucas is a 48 y.o. female.  {Add pertinent medical, surgical, social history, OB history to YEP:[id number]} Patient has a history of diabetes, COPD, rheumatoid arthritis, hypertension and history of kidney stones.  She has been hurting in the left flank for about 3 to 4 weeks and was seen and had a CT scan done at [facility].  She just recently found out that she does have a stone on the left side but did not know how big it was and no arrangements for follow-up have been made.  She saw her rheumatologist who checked her creatinine and found that it was elevated so she was told to come to the emergency department  The history is provided by the patient and medical records. No language interpreter was used.  Flank Pain This is a recurrent problem. The current episode started more than 2 days ago. The problem occurs constantly. The problem has been gradually worsening. Pertinent negatives include no chest pain, no abdominal pain and no headaches. Nothing aggravates the symptoms. Nothing relieves the symptoms. She has tried nothing for the symptoms.       Prior to Admission medications   Medication Sig Start Date End Date Taking? Authorizing Provider  acetaminophen  (TYLENOL ) 500 MG tablet Take 1,000 mg by mouth daily as needed for mild pain.     [provider]  albuterol  (PROVENTIL ) (2.5 MG/3ML) 0.083% nebulizer solution Take 2.5 mg by nebulization every 6 (six) hours as needed for wheezing or shortness of breath.    [provider]  albuterol  (VENTOLIN  HFA) 108 (90 Base) MCG/ACT inhaler Inhale 1-2 puffs into the lungs every 6 (six) hours as needed for wheezing or shortness of breath. 07/31/21   Stuart Vernell Norris, PA-C  alendronate (FOSAMAX) 70 MG tablet Take 70 mg by mouth once a  week. 09/15/22   [provider]  atenolol (TENORMIN) 50 MG tablet Take 50 mg by mouth daily. 11/05/23   [provider]  BELBUCA  150 MCG FILM Take 150 mcg by mouth 2 (two) times daily. Patient not taking: Reported on 12/11/2023 12/07/23   [provider]  BELBUCA  75 MCG FILM Take 75 mcg by mouth 2 (two) times daily. 11/20/23   [provider]  budeson-glycopyrrolate -formoterol (BREZTRI  AEROSPHERE) 160-9-4.8 MCG/ACT AERO Inhale 2 puffs into the lungs in the morning and at bedtime. 12/11/23   Neysa Reggy BIRCH, MD  cetirizine  (ZYRTEC  ALLERGY) 10 MG tablet Take 1 tablet (10 mg total) by mouth daily. 07/31/21   Stuart Vernell Norris, PA-C  Cholecalciferol  (VITAMIN D ) 2000 units CAPS Take 1 capsule by mouth daily.    [provider]  Cholecalciferol  5000 units capsule Take 1 capsule (5,000 Units total) by mouth daily. 11/24/15   Signa Delon LABOR, NP  cyclobenzaprine  (FLEXERIL ) 10 MG tablet Take 10 mg by mouth 3 (three) times daily as needed. 07/22/21   [provider]  famotidine  (PEPCID ) 40 MG tablet Take 40 mg by mouth daily. 11/28/23   [provider]  fluticasone  (FLONASE ) 50 MCG/ACT nasal spray Place 1 spray into both nostrils 2 (two) times daily. 07/31/21   Stuart Vernell Norris, PA-C  folic acid  (FOLVITE ) 1 MG tablet Take 1 tablet by mouth daily. 07/09/19   [provider]  glipiZIDE (GLUCOTROL) 10 MG tablet Take 10 mg  by mouth daily. 11/05/23   [provider]  hydroxychloroquine  (PLAQUENIL ) 200 MG tablet Take 400 mg by mouth daily.    [provider]  Ipratropium-Albuterol  (COMBIVENT RESPIMAT) 20-100 MCG/ACT AERS respimat Inhale 1 puff into the lungs every 6 (six) hours as needed for wheezing or shortness of breath. 10/23/18   [provider]  methotrexate (50 MG/ML) 1 g injection Inject into the vein once a week.    [provider]  methylPREDNISolone  (MEDROL  DOSEPAK) 4 MG TBPK tablet Take as  directed 01/14/24   Sikora, Rebecca, DPM  oxyCODONE  (OXY IR/ROXICODONE ) 5 MG immediate release tablet Take 7.5 mg by mouth every 6 (six) hours as needed. pain    [provider]  OZEMPIC, 2 MG/DOSE, 8 MG/3ML SOPN Inject 2 mg into the skin once a week. 11/13/23   [provider]  predniSONE  (DELTASONE ) 10 MG tablet Take 10 mg by mouth daily with breakfast.    [provider]  predniSONE  (STERAPRED UNI-PAK 21 TAB) 10 MG (21) TBPK tablet Take per package instructions 12/14/23   Chandra Raisin A, NP  pregabalin  (LYRICA ) 150 MG capsule Take 150 mg by mouth 3 (three) times daily. 07/01/21   [provider]  promethazine -dextromethorphan (PROMETHAZINE -DM) 6.25-15 MG/5ML syrup Take 5 mLs by mouth 4 (four) times daily as needed. Patient not taking: Reported on 12/11/2023 11/28/23   Stuart Vernell Norris, PA-C  simvastatin (ZOCOR) 10 MG tablet Take 10 mg by mouth at bedtime. 06/11/21   [provider]  Cimetidine (HEARTBURN RELIEF PO) Take 1 tablet by mouth daily as needed (heartburn).  05/31/20  [provider]    Allergies: Nsaids, Aspirin, Flagyl  [metronidazole ], and Macrobid [nitrofurantoin macrocrystal]    Review of Systems  Constitutional:  Negative for appetite change and fatigue.  HENT:  Negative for congestion, ear discharge and sinus pressure.   Eyes:  Negative for discharge.  Respiratory:  Negative for cough.   Cardiovascular:  Negative for chest pain.  Gastrointestinal:  Negative for abdominal pain and diarrhea.  Genitourinary:  Positive for flank pain. Negative for frequency and hematuria.  Musculoskeletal:  Negative for back pain.  Skin:  Negative for rash.  Neurological:  Negative for seizures and headaches.  Psychiatric/Behavioral:  Negative for hallucinations.     Updated Vital Signs BP (!) 143/96   Pulse (!) 115   Temp 98.7 F (37.1 C)   Resp 16   LMP 01/22/2011   SpO2 93%   Physical Exam Vitals and nursing note  reviewed.  Constitutional:      General: She is in acute distress.     Appearance: She is well-developed.  HENT:     Head: Normocephalic.     Nose: Nose normal.  Eyes:     General: No scleral icterus.    Conjunctiva/sclera: Conjunctivae normal.  Neck:     Thyroid : No thyromegaly.  Cardiovascular:     Rate and Rhythm: Normal rate and regular rhythm.     Heart sounds: No murmur heard.    No friction rub. No gallop.  Pulmonary:     Breath sounds: No stridor. No wheezing or rales.  Chest:     Chest wall: No tenderness.  Abdominal:     General: There is no distension.     Tenderness: There is no abdominal tenderness. There is no rebound.  Musculoskeletal:        General: Normal range of motion.     Cervical back: Neck supple.     Comments: Tender left flank  Lymphadenopathy:     Cervical: No cervical adenopathy.  Skin:    Findings: No erythema or rash.  Neurological:     Mental Status: She is alert and oriented to person, place, and time.     Motor: No abnormal muscle tone.     Coordination: Coordination normal.  Psychiatric:        Behavior: Behavior normal.     (all labs ordered are listed, but only abnormal results are displayed) Labs Reviewed  COMPREHENSIVE METABOLIC PANEL WITH GFR - Abnormal; Notable for the following components:      Result Value   Glucose, Bld 111 (*)    Creatinine, Ser 1.24 (*)    GFR, Estimated 54 (*)    All other components within normal limits  LIPASE, BLOOD  CBC  URINALYSIS, ROUTINE W REFLEX MICROSCOPIC    EKG: None  Radiology: CT Renal Stone Study Result Date: 04/14/2024 CLINICAL DATA:  Abdominal/flank pain, stone suspected EXAM: CT ABDOMEN AND PELVIS WITHOUT CONTRAST TECHNIQUE: Multidetector CT imaging of the abdomen and pelvis was performed following the standard protocol without IV contrast. RADIATION DOSE REDUCTION: This exam was performed according to the departmental dose-optimization program which includes automated exposure  control, adjustment of the mA and/or kV according to patient size and/or use of iterative reconstruction technique. COMPARISON:  09/10/2021 FINDINGS: Of note, the lack of intravenous contrast limits evaluation of the solid organ parenchyma and vascularity. Lower chest: No focal airspace consolidation or pleural effusion. Hepatobiliary: No mass.Cholecystectomy. No intrahepatic or extrahepatic biliary ductal dilation. Pancreas: No mass or main ductal dilation. No peripancreatic inflammation or fluid collection. Spleen: Normal size. No mass. Adrenals/Urinary Tract: No adrenal masses. 2.5 cm right upper pole cyst. Moderate left-sided hydronephrosis due to an obstructive calculus in the proximal left ureter, measuring 9 x 11 x 14 mm. Punctate nonobstructive bilateral calyceal calculi also present. The urinary bladder is completely decompressed. Stomach/Bowel: The stomach is decompressed without focal abnormality. No small bowel wall thickening or inflammation. No small bowel obstruction.Normal appendix. Descending and sigmoid colonic diverticulosis. No changes of acute diverticulitis. Vascular/Lymphatic: No aortic aneurysm. Diffuse aortoiliac atherosclerosis. No intraabdominal or pelvic lymphadenopathy. Reproductive: fluid density structure in the right adnexa measuring 4.3 cm (axial 63). No free pelvic fluid. Other: No pneumoperitoneum, ascites, or mesenteric inflammation. Musculoskeletal: No acute fracture or destructive lesion. Multilevel thoracolumbar osteophytosis. IMPRESSION: 1. Obstructive proximal left ureteral calculus, measuring 9 x 11 x 14 mm, causing moderate hydronephrosis. 2. Fluid density structure in the right adnexa, measuring 4.3 cm (axial 63), which smaller than on the prior study, possibly an ovarian cyst, paraovarian cyst, or chronic hydrosalpinx. 3. Total colonic diverticulosis. No changes of acute diverticulitis. Aortic Atherosclerosis (ICD10-I70.0). Electronically Signed   By: Rogelia Myers M.D.    On: 04/14/2024 22:10    {Document cardiac monitor, telemetry assessment procedure when appropriate:32947} Procedures   Medications Ordered in the ED  HYDROmorphone  (DILAUDID ) injection 0.5 mg (has no administration in time range)  ondansetron  (ZOFRAN ) injection 4 mg (has no administration in time range)  HYDROmorphone  (DILAUDID ) injection 0.5 mg (0.5 mg Intravenous Given 04/14/24 2135)  ondansetron  (ZOFRAN ) injection 4 mg (4 mg Intravenous Given 04/14/24 2139)   CT scan shows obstructed 9 x 11 x 14 mm left ureteral stone.  This is causing moderate hydronephrosis.  Patient's creatinine seems to have doubled from 0.6-1.2.   {Click here for ABCD2, HEART and other calculators REFRESH Note before signing:1}  Medical Decision Making Amount and/or Complexity of Data Reviewed Labs: ordered. Radiology: ordered.  Risk Prescription drug management.   Large left ureteral stone.  {Document critical care time when appropriate  Document review of labs and clinical decision tools ie CHADS2VASC2, etc  Document your independent review of radiology images and any outside records  Document your discussion with family members, caretakers and with consultants  Document social determinants of health affecting pt's care  Document your decision making why or why not admission, treatments were needed:32947:::1}   Final diagnoses:  None    ED Discharge Orders     None

## 2024-04-15 ENCOUNTER — Observation Stay (HOSPITAL_COMMUNITY)

## 2024-04-15 ENCOUNTER — Encounter (HOSPITAL_COMMUNITY): Payer: Self-pay | Admitting: Internal Medicine

## 2024-04-15 ENCOUNTER — Observation Stay (HOSPITAL_COMMUNITY): Admitting: Anesthesiology

## 2024-04-15 ENCOUNTER — Encounter (HOSPITAL_COMMUNITY)
Admission: EM | Disposition: A | Discharge status: Home / Self Care | Payer: Self-pay | Source: Home / Self Care | Attending: Internal Medicine

## 2024-04-15 DIAGNOSIS — J441 Chronic obstructive pulmonary disease with (acute) exacerbation: Secondary | ICD-10-CM | POA: Insufficient documentation

## 2024-04-15 DIAGNOSIS — E66813 Obesity, class 3: Secondary | ICD-10-CM | POA: Diagnosis present

## 2024-04-15 DIAGNOSIS — M0579 Rheumatoid arthritis with rheumatoid factor of multiple sites without organ or systems involvement: Secondary | ICD-10-CM | POA: Diagnosis present

## 2024-04-15 DIAGNOSIS — Z7952 Long term (current) use of systemic steroids: Secondary | ICD-10-CM | POA: Diagnosis not present

## 2024-04-15 DIAGNOSIS — Z8701 Personal history of pneumonia (recurrent): Secondary | ICD-10-CM | POA: Diagnosis not present

## 2024-04-15 DIAGNOSIS — N3000 Acute cystitis without hematuria: Secondary | ICD-10-CM | POA: Diagnosis not present

## 2024-04-15 DIAGNOSIS — E785 Hyperlipidemia, unspecified: Secondary | ICD-10-CM | POA: Diagnosis present

## 2024-04-15 DIAGNOSIS — N1330 Unspecified hydronephrosis: Secondary | ICD-10-CM | POA: Diagnosis present

## 2024-04-15 DIAGNOSIS — N201 Calculus of ureter: Principal | ICD-10-CM | POA: Diagnosis present

## 2024-04-15 DIAGNOSIS — Z5982 Transportation insecurity: Secondary | ICD-10-CM | POA: Diagnosis not present

## 2024-04-15 DIAGNOSIS — Q6211 Congenital occlusion of ureteropelvic junction: Secondary | ICD-10-CM | POA: Diagnosis not present

## 2024-04-15 DIAGNOSIS — R000 Tachycardia, unspecified: Secondary | ICD-10-CM | POA: Insufficient documentation

## 2024-04-15 DIAGNOSIS — Z635 Disruption of family by separation and divorce: Secondary | ICD-10-CM | POA: Diagnosis not present

## 2024-04-15 DIAGNOSIS — Z886 Allergy status to analgesic agent status: Secondary | ICD-10-CM | POA: Diagnosis not present

## 2024-04-15 DIAGNOSIS — M797 Fibromyalgia: Secondary | ICD-10-CM | POA: Diagnosis present

## 2024-04-15 DIAGNOSIS — Z7985 Long-term (current) use of injectable non-insulin antidiabetic drugs: Secondary | ICD-10-CM | POA: Diagnosis not present

## 2024-04-15 DIAGNOSIS — F17210 Nicotine dependence, cigarettes, uncomplicated: Secondary | ICD-10-CM | POA: Diagnosis present

## 2024-04-15 DIAGNOSIS — Z7984 Long term (current) use of oral hypoglycemic drugs: Secondary | ICD-10-CM | POA: Diagnosis not present

## 2024-04-15 DIAGNOSIS — Z9049 Acquired absence of other specified parts of digestive tract: Secondary | ICD-10-CM | POA: Diagnosis not present

## 2024-04-15 DIAGNOSIS — Z936 Other artificial openings of urinary tract status: Secondary | ICD-10-CM | POA: Diagnosis not present

## 2024-04-15 DIAGNOSIS — Z8249 Family history of ischemic heart disease and other diseases of the circulatory system: Secondary | ICD-10-CM | POA: Diagnosis not present

## 2024-04-15 DIAGNOSIS — N136 Pyonephrosis: Secondary | ICD-10-CM | POA: Diagnosis present

## 2024-04-15 DIAGNOSIS — N179 Acute kidney failure, unspecified: Secondary | ICD-10-CM | POA: Diagnosis present

## 2024-04-15 DIAGNOSIS — Z87442 Personal history of urinary calculi: Secondary | ICD-10-CM | POA: Diagnosis not present

## 2024-04-15 DIAGNOSIS — Z555 Less than a high school diploma: Secondary | ICD-10-CM | POA: Diagnosis not present

## 2024-04-15 DIAGNOSIS — N390 Urinary tract infection, site not specified: Secondary | ICD-10-CM

## 2024-04-15 DIAGNOSIS — Z7983 Long term (current) use of bisphosphonates: Secondary | ICD-10-CM | POA: Diagnosis not present

## 2024-04-15 DIAGNOSIS — Z833 Family history of diabetes mellitus: Secondary | ICD-10-CM | POA: Diagnosis not present

## 2024-04-15 DIAGNOSIS — I10 Essential (primary) hypertension: Secondary | ICD-10-CM | POA: Diagnosis present

## 2024-04-15 DIAGNOSIS — Z803 Family history of malignant neoplasm of breast: Secondary | ICD-10-CM | POA: Diagnosis not present

## 2024-04-15 DIAGNOSIS — E119 Type 2 diabetes mellitus without complications: Secondary | ICD-10-CM | POA: Diagnosis present

## 2024-04-15 HISTORY — PX: IR NEPHROSTOMY PLACEMENT LEFT: IMG6063

## 2024-04-15 HISTORY — PX: CYSTOSCOPY WITH RETROGRADE PYELOGRAM, URETEROSCOPY AND STENT PLACEMENT: SHX5789

## 2024-04-15 LAB — TSH: TSH: 0.617 u[IU]/mL (ref 0.350–4.500)

## 2024-04-15 LAB — HEMOGLOBIN A1C
Hgb A1c MFr Bld: 5.6 % (ref 4.8–5.6)
Mean Plasma Glucose: 114.02 mg/dL

## 2024-04-15 LAB — PROTIME-INR
INR: 0.9 (ref 0.8–1.2)
Prothrombin Time: 13.1 s (ref 11.4–15.2)

## 2024-04-15 LAB — GLUCOSE, CAPILLARY
Glucose-Capillary: 135 mg/dL — ABNORMAL HIGH (ref 70–99)
Glucose-Capillary: 135 mg/dL — ABNORMAL HIGH (ref 70–99)
Glucose-Capillary: 206 mg/dL — ABNORMAL HIGH (ref 70–99)
Glucose-Capillary: 208 mg/dL — ABNORMAL HIGH (ref 70–99)

## 2024-04-15 LAB — HIV ANTIBODY (ROUTINE TESTING W REFLEX): HIV Screen 4th Generation wRfx: NONREACTIVE

## 2024-04-15 SURGERY — CYSTOURETEROSCOPY, WITH RETROGRADE PYELOGRAM AND STENT INSERTION
Anesthesia: General | Site: Ureter | Laterality: Left

## 2024-04-15 MED ORDER — MIDAZOLAM HCL 2 MG/2ML IJ SOLN
INTRAMUSCULAR | Status: DC | PRN
  Administered 2024-04-15: 2 mg via INTRAVENOUS

## 2024-04-15 MED ORDER — DEXAMETHASONE SODIUM PHOSPHATE 10 MG/ML IJ SOLN
INTRAMUSCULAR | Status: DC | PRN
  Administered 2024-04-15: 5 mg via INTRAVENOUS

## 2024-04-15 MED ORDER — 0.9 % SODIUM CHLORIDE (POUR BTL) OPTIME
TOPICAL | Status: DC | PRN
  Administered 2024-04-15: 1000 mL

## 2024-04-15 MED ORDER — LEVALBUTEROL HCL 0.63 MG/3ML IN NEBU: 0.6300 mg | INHALATION_SOLUTION | Freq: Four times a day (QID) | RESPIRATORY_TRACT | Status: DC | PRN

## 2024-04-15 MED ORDER — IOHEXOL 300 MG/ML  SOLN
50.0000 mL | Freq: Once | INTRAMUSCULAR | Status: AC | PRN
  Administered 2024-04-15: 10 mL

## 2024-04-15 MED ORDER — ONDANSETRON HCL 4 MG/2ML IJ SOLN
INTRAMUSCULAR | Status: DC | PRN
  Administered 2024-04-15: 4 mg via INTRAVENOUS

## 2024-04-15 MED ORDER — DIPHENHYDRAMINE HCL 50 MG/ML IJ SOLN
INTRAMUSCULAR | Status: AC | PRN
Start: 2024-04-15 — End: 2024-04-15
  Administered 2024-04-15: 25 mg via INTRAVENOUS

## 2024-04-15 MED ORDER — IOHEXOL 300 MG/ML  SOLN
INTRAMUSCULAR | Status: DC | PRN
  Administered 2024-04-15: 10 mL via URETHRAL

## 2024-04-15 MED ORDER — DIPHENHYDRAMINE HCL 50 MG/ML IJ SOLN
INTRAMUSCULAR | Status: AC
  Filled 2024-04-15: qty 1

## 2024-04-15 MED ORDER — HYDROMORPHONE HCL 1 MG/ML IJ SOLN
0.5000 mg | INTRAMUSCULAR | Status: DC | PRN
  Filled 2024-04-15: qty 1

## 2024-04-15 MED ORDER — SODIUM CHLORIDE 0.9% FLUSH
5.0000 mL | Freq: Three times a day (TID) | INTRAVENOUS | Status: DC
  Administered 2024-04-15 – 2024-04-17 (×5): 5 mL

## 2024-04-15 MED ORDER — ORAL CARE MOUTH RINSE
15.0000 mL | OROMUCOSAL | Status: DC | PRN
Start: 2024-04-15 — End: 2024-04-17

## 2024-04-15 MED ORDER — PREGABALIN 50 MG PO CAPS
200.0000 mg | ORAL_CAPSULE | Freq: Three times a day (TID) | ORAL | Status: DC
  Administered 2024-04-15 – 2024-04-17 (×5): 200 mg via ORAL
  Filled 2024-04-15 (×6): qty 1

## 2024-04-15 MED ORDER — LACTATED RINGERS IV SOLN: INTRAVENOUS | Status: DC | PRN

## 2024-04-15 MED ORDER — CIPROFLOXACIN IN D5W 400 MG/200ML IV SOLN
INTRAVENOUS | Status: AC
  Filled 2024-04-15: qty 200

## 2024-04-15 MED ORDER — MIDAZOLAM HCL 2 MG/2ML IJ SOLN
INTRAMUSCULAR | Status: AC
Start: 2024-04-15 — End: 2024-04-15
  Filled 2024-04-15: qty 2

## 2024-04-15 MED ORDER — INSULIN ASPART 100 UNIT/ML IJ SOLN
0.0000 [IU] | Freq: Three times a day (TID) | INTRAMUSCULAR | Status: DC
  Administered 2024-04-15: 5 [IU] via SUBCUTANEOUS
  Administered 2024-04-16: 8 [IU] via SUBCUTANEOUS
  Administered 2024-04-16: 3 [IU] via SUBCUTANEOUS
  Administered 2024-04-17 (×2): 5 [IU] via SUBCUTANEOUS

## 2024-04-15 MED ORDER — LIDOCAINE-EPINEPHRINE 1 %-1:100000 IJ SOLN
INTRAMUSCULAR | Status: AC
Start: 2024-04-15 — End: 2024-04-15
  Filled 2024-04-15: qty 1

## 2024-04-15 MED ORDER — MIDAZOLAM HCL 2 MG/2ML IJ SOLN
INTRAMUSCULAR | Status: AC | PRN
  Administered 2024-04-15: 1 mg via INTRAVENOUS

## 2024-04-15 MED ORDER — HYDROCORTISONE SOD SUC (PF) 100 MG IJ SOLR
50.0000 mg | Freq: Once | INTRAMUSCULAR | Status: AC
  Administered 2024-04-15: 50 mg via INTRAVENOUS
  Filled 2024-04-15: qty 2

## 2024-04-15 MED ORDER — SODIUM CHLORIDE 0.9 % IV SOLN
1.0000 g | INTRAVENOUS | Status: DC
  Administered 2024-04-15: 1 g via INTRAVENOUS
  Filled 2024-04-15: qty 10

## 2024-04-15 MED ORDER — FENTANYL CITRATE PF 50 MCG/ML IJ SOSY
25.0000 ug | PREFILLED_SYRINGE | INTRAMUSCULAR | Status: DC | PRN
  Administered 2024-04-15 (×2): 50 ug via INTRAVENOUS

## 2024-04-15 MED ORDER — INSULIN ASPART 100 UNIT/ML IJ SOLN
0.0000 [IU] | Freq: Every day | INTRAMUSCULAR | Status: DC
  Administered 2024-04-15 – 2024-04-16 (×2): 2 [IU] via SUBCUTANEOUS

## 2024-04-15 MED ORDER — FENTANYL CITRATE (PF) 100 MCG/2ML IJ SOLN
INTRAMUSCULAR | Status: AC
Start: 2024-04-15 — End: 2024-04-15
  Filled 2024-04-15: qty 2

## 2024-04-15 MED ORDER — LIDOCAINE-EPINEPHRINE 1 %-1:100000 IJ SOLN
20.0000 mL | Freq: Once | INTRAMUSCULAR | Status: AC
  Administered 2024-04-15: 13 mL via INTRADERMAL

## 2024-04-15 MED ORDER — PROPOFOL 10 MG/ML IV BOLUS
INTRAVENOUS | Status: DC | PRN
  Administered 2024-04-15: 200 mg via INTRAVENOUS

## 2024-04-15 MED ORDER — MORPHINE SULFATE (PF) 4 MG/ML IV SOLN
4.0000 mg | Freq: Once | INTRAVENOUS | Status: AC
  Administered 2024-04-15: 4 mg via INTRAVENOUS
  Filled 2024-04-15: qty 1

## 2024-04-15 MED ORDER — SODIUM CHLORIDE 0.9 % IV BOLUS
1000.0000 mL | Freq: Once | INTRAVENOUS | Status: AC
  Administered 2024-04-15: 1000 mL via INTRAVENOUS

## 2024-04-15 MED ORDER — FENTANYL CITRATE (PF) 100 MCG/2ML IJ SOLN
INTRAMUSCULAR | Status: AC | PRN
  Administered 2024-04-15: 50 ug via INTRAVENOUS

## 2024-04-15 MED ORDER — FENTANYL CITRATE PF 50 MCG/ML IJ SOSY
PREFILLED_SYRINGE | INTRAMUSCULAR | Status: AC
  Filled 2024-04-15: qty 1

## 2024-04-15 MED ORDER — MIDAZOLAM HCL 2 MG/2ML IJ SOLN
INTRAMUSCULAR | Status: AC
  Filled 2024-04-15: qty 2

## 2024-04-15 MED ORDER — OXYCODONE HCL 5 MG/5ML PO SOLN: 5.0000 mg | Freq: Once | ORAL | Status: DC | PRN

## 2024-04-15 MED ORDER — OXYCODONE-ACETAMINOPHEN 10-325 MG PO TABS: 1.0000 | ORAL_TABLET | Freq: Three times a day (TID) | ORAL | Status: DC | PRN

## 2024-04-15 MED ORDER — FENTANYL CITRATE (PF) 250 MCG/5ML IJ SOLN
INTRAMUSCULAR | Status: DC | PRN
  Administered 2024-04-15 (×2): 25 ug via INTRAVENOUS

## 2024-04-15 MED ORDER — CIPROFLOXACIN IN D5W 400 MG/200ML IV SOLN
400.0000 mg | Freq: Once | INTRAVENOUS | Status: AC
  Administered 2024-04-15: 400 mg via INTRAVENOUS

## 2024-04-15 MED ORDER — METHYLPREDNISOLONE SODIUM SUCC 40 MG IJ SOLR: 40.0000 mg | Freq: Once | INTRAMUSCULAR | Status: DC

## 2024-04-15 MED ORDER — LIDOCAINE HCL (PF) 2 % IJ SOLN
INTRAMUSCULAR | Status: DC | PRN
  Administered 2024-04-15: 60 mg via INTRADERMAL

## 2024-04-15 MED ORDER — LEVALBUTEROL HCL 0.63 MG/3ML IN NEBU
0.6300 mg | INHALATION_SOLUTION | Freq: Four times a day (QID) | RESPIRATORY_TRACT | Status: DC
  Administered 2024-04-15 – 2024-04-17 (×8): 0.63 mg via RESPIRATORY_TRACT
  Filled 2024-04-15 (×8): qty 3

## 2024-04-15 MED ORDER — SODIUM CHLORIDE 0.9 % IR SOLN
Status: DC | PRN
  Administered 2024-04-15: 3000 mL

## 2024-04-15 MED ORDER — ONDANSETRON HCL 4 MG/2ML IJ SOLN: 4.0000 mg | Freq: Four times a day (QID) | INTRAMUSCULAR | Status: DC | PRN

## 2024-04-15 MED ORDER — OXYCODONE-ACETAMINOPHEN 5-325 MG PO TABS
1.0000 | ORAL_TABLET | Freq: Three times a day (TID) | ORAL | Status: DC | PRN
  Administered 2024-04-15 – 2024-04-17 (×6): 1 via ORAL
  Filled 2024-04-15 (×7): qty 1

## 2024-04-15 MED ORDER — HYDROXYCHLOROQUINE SULFATE 200 MG PO TABS
400.0000 mg | ORAL_TABLET | Freq: Every day | ORAL | Status: DC
  Administered 2024-04-15 – 2024-04-17 (×3): 400 mg via ORAL
  Filled 2024-04-15 (×3): qty 2

## 2024-04-15 MED ORDER — FENTANYL CITRATE (PF) 100 MCG/2ML IJ SOLN
INTRAMUSCULAR | Status: AC
  Filled 2024-04-15: qty 2

## 2024-04-15 MED ORDER — METHYLPREDNISOLONE SODIUM SUCC 40 MG IJ SOLR
40.0000 mg | Freq: Two times a day (BID) | INTRAMUSCULAR | Status: DC
  Administered 2024-04-15 – 2024-04-17 (×4): 40 mg via INTRAVENOUS
  Filled 2024-04-15 (×4): qty 1

## 2024-04-15 MED ORDER — OXYCODONE HCL 5 MG PO TABS: 5.0000 mg | ORAL_TABLET | Freq: Once | ORAL | Status: DC | PRN

## 2024-04-15 MED ORDER — OXYCODONE HCL 5 MG PO TABS
5.0000 mg | ORAL_TABLET | Freq: Three times a day (TID) | ORAL | Status: DC | PRN
  Administered 2024-04-15 – 2024-04-17 (×7): 5 mg via ORAL
  Filled 2024-04-15 (×7): qty 1

## 2024-04-15 MED ORDER — FENTANYL CITRATE PF 50 MCG/ML IJ SOSY
25.0000 ug | PREFILLED_SYRINGE | INTRAMUSCULAR | Status: DC | PRN
  Administered 2024-04-15 (×4): 50 ug via INTRAVENOUS
  Administered 2024-04-16: 25 ug via INTRAVENOUS
  Filled 2024-04-15 (×5): qty 1

## 2024-04-15 SURGICAL SUPPLY — 19 items
BAG URO CATCHER STRL LF (MISCELLANEOUS) ×1 IMPLANT
CATH URETL OPEN 5X70 (CATHETERS) IMPLANT
CLOTH BEACON ORANGE TIMEOUT ST (SAFETY) ×1 IMPLANT
GLOVE BIO SURGEON STRL SZ8 (GLOVE) IMPLANT
GLOVE BIOGEL PI IND STRL 7.5 (GLOVE) IMPLANT
GLOVE BIOGEL PI IND STRL 8 (GLOVE) IMPLANT
GLOVE SURG LX STRL 8.0 MICRO (GLOVE) ×1 IMPLANT
GOWN STRL REUS W/ TWL XL LVL3 (GOWN DISPOSABLE) ×1 IMPLANT
GUIDEWIRE STR DUAL SENSOR (WIRE) ×1 IMPLANT
GUIDEWIRE ZIPWRE .038 STRAIGHT (WIRE) IMPLANT
KIT TURNOVER KIT A (KITS) ×1 IMPLANT
MANIFOLD NEPTUNE II (INSTRUMENTS) ×1 IMPLANT
PACK CYSTO (CUSTOM PROCEDURE TRAY) ×1 IMPLANT
SHEATH NAVIGATOR HD 11/13X36 (SHEATH) IMPLANT
SHEATH NAVIGATOR HD 12/14X36 (SHEATH) IMPLANT
SHEATH URETERAL FLEX 12X35 (SHEATH) IMPLANT
TRACTIP FLEXIVA PULS ID 200XHI (Laser) IMPLANT
TUBING CONNECTING 10 (TUBING) ×1 IMPLANT
TUBING UROLOGY SET (TUBING) ×1 IMPLANT

## 2024-04-15 NOTE — Anesthesia Procedure Notes (Signed)
 Procedure Name: LMA Insertion Date/Time: 04/15/2024 5:19 AM  Performed by: Claudene Hoy CROME, CRNAPre-anesthesia Checklist: Patient identified, Emergency Drugs available, Suction available and Patient being monitored Patient Re-evaluated:Patient Re-evaluated prior to induction Oxygen  Delivery Method: Circle System Utilized Preoxygenation: Pre-oxygenation with 100% oxygen  Induction Type: IV induction Ventilation: Mask ventilation without difficulty LMA: LMA inserted LMA Size: 4.0 Number of attempts: 1 Placement Confirmation: positive ETCO2 Tube secured with: Tape Dental Injury: Teeth and Oropharynx as per pre-operative assessment

## 2024-04-15 NOTE — ED Notes (Signed)
 Report called to [facility] ED charge [facility]

## 2024-04-15 NOTE — Consult Note (Signed)
 Urology Consult   Physician requesting consult: Carlin Gula, MD  Reason for consult: Left ureteral calculus, UTI  History of Present Illness: Cassandra Lucas is a 48 y.o. female who is seen in consultation from Dr. Gula for evaluation of a left ureteral calculus and UTI. She reports a long history of nephrolithiasis passing the majority of her stones spontaneously.  She presented to the emergency room yesterday with a 3-week history of left-sided flank pain.  Her pain has been worsening.  She has had some nausea without vomiting.  No fevers or chills. Creatinine 1.24 WBC 8.0 Urinalysis: >50 RBCs, >50 WBCs, rare bacteria, nitrite negative CT renal stone study shows a 9 x 11 x 14 mm stone in the left proximal ureter with associated left hydronephrosis.   Past Medical History:  Diagnosis Date   Anemia    Anxiety    Arthritis    Asthma    Carpal tunnel syndrome, bilateral 11/22/2015   Will try wrist splints   Community acquired pneumonia    COPD (chronic obstructive pulmonary disease) (HCC)    Decreased libido 03/26/2015   Diabetes mellitus without complication (HCC)    prediabetes   Dyslipidemia 11/24/2015   Empty sella (HCC)    Fibromyalgia    Fibromyalgia    Hot flashes 03/26/2015   Hyperlipidemia    Kidney stones    Productive cough    smoker   Renal disorder    Vitamin D  deficiency 11/24/2015    Past Surgical History:  Procedure Laterality Date   ABDOMINAL HYSTERECTOMY     has ovaries.    BREAST CYST EXCISION     CESAREAN SECTION  2006   [facility]   LAPAROSCOPIC CHOLECYSTECTOMY  2002   [facility]   LAPAROSCOPIC HYSTERECTOMY  04/12/2011   Procedure: HYSTERECTOMY TOTAL LAPAROSCOPIC;  Surgeon: Vonn VEAR Inch, MD;  Location: [facility];  Service: Gynecology;  Laterality: N/A;   THYROIDECTOMY, PARTIAL  2012   [facility], [facility]    Medications:  Scheduled Meds: Continuous Infusions:  sodium chloride      PRN Meds:.  Allergies:  Allergies  Allergen Reactions   Nsaids  Shortness Of Breath   Aspirin Other (See Comments)    Unknown childhood reaction   Flagyl  [Metronidazole ]    Macrobid [Nitrofurantoin Macrocrystal] Nausea And Vomiting    Family History  Problem Relation Age of Onset   Breast cancer Mother    Diabetes Father    Heart failure Father    Migraines Sister    Scoliosis Daughter    ADD / ADHD Son    Cancer Maternal Grandfather        pancreatic   Diabetes Paternal Grandmother    Congestive Heart Failure Paternal Grandmother    ADD / ADHD Daughter    Scoliosis Daughter    Hyperlipidemia Daughter    Diabetes Daughter        prediabetes   Bipolar disorder Son    ADD / ADHD Son     Social History:  reports that she has been smoking cigarettes. She has a 4.3 pack-year smoking history. She has never used smokeless tobacco. She reports that she does not drink alcohol and does not use drugs.  ROS: A complete review of systems was performed.  All systems are negative except for pertinent findings as noted.  Physical Exam:  Vital signs in last 24 hours: Temp:  [98.6 F (37 C)-98.9 F (37.2 C)] 98.6 F (37 C) (07/29 0117) Pulse Rate:  [110-127] 121 (07/29 0117) Resp:  [16-18]  18 (07/29 0117) BP: (143-159)/(96-109) 153/109 (07/29 0117) SpO2:  [93 %-100 %] 100 % (07/29 0117) GENERAL APPEARANCE:  Well appearing, well developed, well nourished, NAD HEENT:  Atraumatic, normocephalic, oropharynx clear NECK:  Supple without lymphadenopathy or thyromegaly ABDOMEN:  Soft, non-tender, no masses EXTREMITIES:  Moves all extremities well, without clubbing, cyanosis, or edema NEUROLOGIC:  Alert and oriented x 3, CN II-XII grossly intact MENTAL STATUS:  appropriate BACK:  Non-tender to palpation, No CVAT SKIN:  Warm, dry, and intact  Laboratory Data:  Recent Labs    04/14/24 2122  WBC 8.0  HGB 13.7  HCT 41.4  PLT 206    Recent Labs    04/14/24 2122  NA 139  K 3.9  CL 100  GLUCOSE 111*  BUN 13  CALCIUM 9.7  CREATININE 1.24*      Results for orders placed or performed during the hospital encounter of 04/14/24 (from the past 24 hours)  Lipase, blood     Status: None   Collection Time: 04/14/24  9:22 PM  Result Value Ref Range   Lipase 35 11 - 51 U/L  Comprehensive metabolic panel     Status: Abnormal   Collection Time: 04/14/24  9:22 PM  Result Value Ref Range   Sodium 139 135 - 145 mmol/L   Potassium 3.9 3.5 - 5.1 mmol/L   Chloride 100 98 - 111 mmol/L   CO2 30 22 - 32 mmol/L   Glucose, Bld 111 (H) 70 - 99 mg/dL   BUN 13 6 - 20 mg/dL   Creatinine, Ser 8.75 (H) 0.44 - 1.00 mg/dL   Calcium 9.7 8.9 - 89.6 mg/dL   Total Protein 6.5 6.5 - 8.1 g/dL   Albumin 3.6 3.5 - 5.0 g/dL   AST 15 15 - 41 U/L   ALT 16 0 - 44 U/L   Alkaline Phosphatase 70 38 - 126 U/L   Total Bilirubin 0.6 0.0 - 1.2 mg/dL   GFR, Estimated 54 (L) >60 mL/min   Anion gap 9 5 - 15  CBC     Status: None   Collection Time: 04/14/24  9:22 PM  Result Value Ref Range   WBC 8.0 4.0 - 10.5 K/uL   RBC 4.40 3.87 - 5.11 MIL/uL   Hemoglobin 13.7 12.0 - 15.0 g/dL   HCT 58.5 63.9 - 53.9 %   MCV 94.1 80.0 - 100.0 fL   MCH 31.1 26.0 - 34.0 pg   MCHC 33.1 30.0 - 36.0 g/dL   RDW 87.2 88.4 - 84.4 %   Platelets 206 150 - 400 K/uL   nRBC 0.0 0.0 - 0.2 %  Urinalysis, Routine w reflex microscopic -Urine, Clean Catch     Status: Abnormal   Collection Time: 04/14/24 10:29 PM  Result Value Ref Range   Color, Urine YELLOW YELLOW   APPearance HAZY (A) CLEAR   Specific Gravity, Urine 1.012 1.005 - 1.030   pH 6.0 5.0 - 8.0   Glucose, UA NEGATIVE NEGATIVE mg/dL   Hgb urine dipstick LARGE (A) NEGATIVE   Bilirubin Urine NEGATIVE NEGATIVE   Ketones, ur NEGATIVE NEGATIVE mg/dL   Protein, ur 30 (A) NEGATIVE mg/dL   Nitrite NEGATIVE NEGATIVE   Leukocytes,Ua LARGE (A) NEGATIVE   RBC / HPF >50 0 - 5 RBC/hpf   WBC, UA >50 0 - 5 WBC/hpf   Bacteria, UA RARE (A) NONE SEEN   Squamous Epithelial / HPF 0-5 0 - 5 /HPF   Mucus PRESENT    Ca Oxalate Crys,  UA PRESENT     No results found for this or any previous visit (from the past 240 hours).  Renal Function: Recent Labs    04/14/24 2122  CREATININE 1.24*   CrCl cannot be calculated (Unknown ideal weight.).  Radiologic Imaging: CT Renal Stone Study Result Date: 04/14/2024 CLINICAL DATA:  Abdominal/flank pain, stone suspected EXAM: CT ABDOMEN AND PELVIS WITHOUT CONTRAST TECHNIQUE: Multidetector CT imaging of the abdomen and pelvis was performed following the standard protocol without IV contrast. RADIATION DOSE REDUCTION: This exam was performed according to the departmental dose-optimization program which includes automated exposure control, adjustment of the mA and/or kV according to patient size and/or use of iterative reconstruction technique. COMPARISON:  09/10/2021 FINDINGS: Of note, the lack of intravenous contrast limits evaluation of the solid organ parenchyma and vascularity. Lower chest: No focal airspace consolidation or pleural effusion. Hepatobiliary: No mass.Cholecystectomy. No intrahepatic or extrahepatic biliary ductal dilation. Pancreas: No mass or main ductal dilation. No peripancreatic inflammation or fluid collection. Spleen: Normal size. No mass. Adrenals/Urinary Tract: No adrenal masses. 2.5 cm right upper pole cyst. Moderate left-sided hydronephrosis due to an obstructive calculus in the proximal left ureter, measuring 9 x 11 x 14 mm. Punctate nonobstructive bilateral calyceal calculi also present. The urinary bladder is completely decompressed. Stomach/Bowel: The stomach is decompressed without focal abnormality. No small bowel wall thickening or inflammation. No small bowel obstruction.Normal appendix. Descending and sigmoid colonic diverticulosis. No changes of acute diverticulitis. Vascular/Lymphatic: No aortic aneurysm. Diffuse aortoiliac atherosclerosis. No intraabdominal or pelvic lymphadenopathy. Reproductive: fluid density structure in the right adnexa measuring 4.3 cm (axial 63).  No free pelvic fluid. Other: No pneumoperitoneum, ascites, or mesenteric inflammation. Musculoskeletal: No acute fracture or destructive lesion. Multilevel thoracolumbar osteophytosis. IMPRESSION: 1. Obstructive proximal left ureteral calculus, measuring 9 x 11 x 14 mm, causing moderate hydronephrosis. 2. Fluid density structure in the right adnexa, measuring 4.3 cm (axial 63), which smaller than on the prior study, possibly an ovarian cyst, paraovarian cyst, or chronic hydrosalpinx. 3. Total colonic diverticulosis. No changes of acute diverticulitis. Aortic Atherosclerosis (ICD10-I70.0). Electronically Signed   By: Rogelia Myers M.D.   On: 04/14/2024 22:10    I independently reviewed the above imaging studies.  Impression/Recommendation Left ureteral calculus UTI  Diagnosis and management of a left ureteral calculus in the setting of a possible UTI discussed with the patient.  Recommendations for surgical management with cystoscopy and left ureteral stent placement discussed with the patient.  I also discussed the alternative option of a percutaneous nephrostomy tube.  Risk and benefits discussed.  She would like to proceed with cystoscopy, left retrograde pyelogram, and insertion of left ureteral stent as soon as possible.  Procedure: The patient will be scheduled for cystoscopy, left retrograde pyelogram, insertion of left ureteral stent at [facility].  Surgical request is placed with the surgery schedulers and will be scheduled at the patient's/family request. Informed consent is given as documented below. Anesthesia: General  The patient does not have sleep apnea, history of MRSA, history of VRE, history of cardiac device requiring special anesthetic needs. Patient is stable and considered clear for surgical management in an outpatient ambulatory surgery setting as well as inpatient hospital setting.  Consent for Operation or Procedure: Provider Certification I hereby certify that the  nature, purpose, benefits, usual and most frequent risks of, and alternatives to, the operation or procedure have been explained to the patient (or person authorized to sign for the patient) either by me as responsible physician or by the  provider who is to perform the operation or procedure. Time spent such that the patient/family has had an opportunity to ask questions, and that those questions have been answered. The patient or the patient's representative has been advised that selected tasks may be performed by assistants to the primary health care provider(s). I believe that the patient (or person authorized to sign for the patient) understands what has been explained, and has consented to the operation or procedure. No guarantees were implied or made.   Adine Manly 04/15/2024, 1:37 AM

## 2024-04-15 NOTE — Op Note (Signed)
 OPERATIVE NOTE   Patient Name: Cassandra Lucas  MRN: [id number]   Date of Procedure: 04/15/24   Preoperative diagnosis:  Left ureteral calculus Left flank pain UTI  Postoperative diagnosis:  Left ureteral calculus Left flank pain UTI  Procedure:  Cystoscopy Left retrograde pyelogram with intra-operative interpretation  Attending: Adine DOROTHA Manly, MD  Anesthesia: General  Estimated blood loss: 0 ml  Fluids: Per anesthesia record  Drains: None  Specimens: None  Antibiotics: Rocephin  IV given pre-operatively  Findings: normal urethra and bladder; large calculus in proximal left ureter with dilation of ureter, renal pelvis and calyces; unable to pass guidewire beyond the calculus  Indications:  48 year old female presented to the emergency room yesterday with a 3-week history of left-sided flank pain.  No fevers or chills.  Urinalysis consistent with possible UTI.  CT renal stone study showed a 9 x 11 x 14 mm stone in the left proximal ureter with associated left hydronephrosis.  She has continued to have left flank pain despite IV pain medicine.  She presents now for surgical management with cystoscopy, left retrograde pyelogram, and insertion of left ureteral stent.  The procedure including potential risk including but not limited to infection, bleeding, injury to structures, need for additional procedures, and anesthetic complications discussed with the patient in detail.  She understands and wishes to proceed as described.  Description of Procedure:  The patient received IV Rocephin  preoperatively.  She was taken to the operating room suite and properly identified.  After successful induction of a general anesthetic, she was placed in the dorsal lithotomy position.  The patient's genital area was prepped and draped in sterile fashion.  A preoperative timeout was performed.  Under direct visualization, a 21 French rigid cystoscope was passed through the urethra into  the bladder.  No urethral or bladder abnormalities were seen.  A normal-appearing trigone was noted with a single orifice bilaterally. A left retrograde pyelogram was performed.  Scout film showed a 1 cm calcification in the area of the left proximal ureter.  A nonspecific bowel gas pattern was seen with no obvious bony abnormalities.  Using a 5 [place] open-ended catheter, contrast was injected into the left ureter.  The distal and mid ureter was normal in its appearance.  The previously noted calcification was confirmed to be within the proximal left ureter.  Contrast was seen filling a dilated ureter proximal to the calcification.  The renal pelvis and calyces were also dilated. The ureteral catheter was passed up to the level of the calcification.  I attempted to pass a sensor guidewire beyond the stone but was unable to easily pass the guidewire.  I then elected to try a zip wire but again the wire coiled adjacent to the stone.  After multiple attempts at passage of the wire beyond the stone, I elected to discontinue the procedure to avoid any risk of ureteral injury.  Injection of contrast did not show any extravasation of contrast.  The bladder was drained and the cystoscope was removed.  The patient was then awakened, extubated and taken to the postanesthesia care unit in stable condition.  Complications: None  Condition: Stable, extubated, transferred to PACU  Plan:  Will make arrangements for placement of a left nephrostomy tube by interventional radiology later today.

## 2024-04-15 NOTE — Procedures (Signed)
 Interventional Radiology Procedure Note  Procedure: Left percutaneous nephrostomy tube placement  Findings: Please refer to procedural dictation for full description. 10 Fr left inferior pole pigtail drain, to bag drainage.  Complications: None immediate  Estimated Blood Loss: < 5 Ml  Recommendations: Keep to bag drainage. IR will follow.   Ester Sides, MD

## 2024-04-15 NOTE — Consult Note (Addendum)
 Chief Complaint: Obstructing left renal calculus  Referring Provider(s): Roseann, Adine  Supervising Physician: Jennefer Rover  Patient Status: Cassandra Lucas - In-pt  History of Present Illness: Cassandra Lucas is a 48 y.o. female who presented to the ED late last night with worsening left flank pain.    CT scan showed a left-sided hydronephrosis with left ureteral stone and UA was concerning for UTI.    Dr. Roseann was unsuccessful with retrograde treatment last night.    We are asked to place a percutaneous nephrostomy.  Labs show WBC of 8 creatinine 1.2.   She is quite anxious and asking to go outside and smoke.  She is NPO. No nausea/vomiting. No Fever/chills. ROS negative.   Patient is Full Code  Past Medical History:  Diagnosis Date   Anemia    Anxiety    Arthritis    Asthma    Carpal tunnel syndrome, bilateral 11/22/2015   Will try wrist splints   Community acquired pneumonia    COPD (chronic obstructive pulmonary disease) (HCC)    Decreased libido 03/26/2015   Diabetes mellitus without complication (HCC)    prediabetes   Dyslipidemia 11/24/2015   Empty sella (HCC)    Fibromyalgia    Fibromyalgia    Hot flashes 03/26/2015   Hyperlipidemia    Kidney stones    Productive cough    smoker   Renal disorder    Vitamin D  deficiency 11/24/2015    Past Surgical History:  Procedure Laterality Date   ABDOMINAL HYSTERECTOMY     has ovaries.    BREAST CYST EXCISION     CESAREAN SECTION  2006   [facility]   LAPAROSCOPIC CHOLECYSTECTOMY  2002   [facility]   LAPAROSCOPIC HYSTERECTOMY  04/12/2011   Procedure: HYSTERECTOMY TOTAL LAPAROSCOPIC;  Surgeon: Vonn VEAR Inch, MD;  Location: [facility];  Service: Gynecology;  Laterality: N/A;   THYROIDECTOMY, PARTIAL  2012   [facility], [facility]    Allergies: Nsaids, Aspirin, Flagyl  [metronidazole ], and Macrobid [nitrofurantoin macrocrystal]  Medications: Prior to Admission medications   Medication Sig Start Date End Date Taking?  Authorizing Provider  acetaminophen  (TYLENOL ) 500 MG tablet Take 1,000 mg by mouth daily as needed for mild pain.    Yes [provider]  albuterol  (PROVENTIL ) (2.5 MG/3ML) 0.083% nebulizer solution Take 2.5 mg by nebulization every 6 (six) hours as needed for wheezing or shortness of breath.   Yes [provider]  albuterol  (VENTOLIN  HFA) 108 (90 Base) MCG/ACT inhaler Inhale 1-2 puffs into the lungs every 6 (six) hours as needed for wheezing or shortness of breath. 07/31/21  Yes Stuart Vernell Norris, PA-C  alendronate (FOSAMAX) 70 MG tablet Take 70 mg by mouth once a week. 09/15/22  Yes [provider]  BELBUCA  150 MCG FILM Take 150 mcg by mouth 2 (two) times daily. 12/07/23  Yes [provider]  budeson-glycopyrrolate -formoterol (BREZTRI  AEROSPHERE) 160-9-4.8 MCG/ACT AERO Inhale 2 puffs into the lungs in the morning and at bedtime. 12/11/23  Yes Young, Reggy D, MD  cetirizine  (ZYRTEC  ALLERGY) 10 MG tablet Take 1 tablet (10 mg total) by mouth daily. 07/31/21  Yes Stuart Vernell Norris, PA-C  cyclobenzaprine  (FLEXERIL ) 10 MG tablet Take 10 mg by mouth at bedtime. 07/22/21  Yes [provider]  famotidine  (PEPCID ) 40 MG tablet Take 40 mg by mouth daily. 11/28/23  Yes [provider]  fluticasone  (FLONASE ) 50 MCG/ACT nasal spray Place 1 spray into both nostrils 2 (two) times daily. Patient taking differently: Place 1 spray  into both nostrils as needed for allergies. 07/31/21  Yes Stuart Vernell Norris, PA-C  folic acid  (FOLVITE ) 1 MG tablet Take 1 tablet by mouth every other day. 07/09/19  Yes [provider]  hydroxychloroquine  (PLAQUENIL ) 200 MG tablet Take 400 mg by mouth daily.   Yes [provider]  Ipratropium-Albuterol  (COMBIVENT RESPIMAT) 20-100 MCG/ACT AERS respimat Inhale 1 puff into the lungs every 6 (six) hours as needed for wheezing or shortness of breath. 10/23/18  Yes [provider]  leflunomide  (ARAVA ) 10  MG tablet Take 10 mg by mouth daily. 04/10/24  Yes [provider]  Multiple Vitamins-Minerals ([employer] ADULT MULTIVITAMIN) CHEW Chew 2 tablets by mouth daily.   Yes [provider]  OHTUVAYRE  3 MG/2.5ML SUSP  04/02/24  Yes [provider]  oxyCODONE -acetaminophen  (PERCOCET) 10-325 MG tablet Take 1 tablet by mouth 3 (three) times daily as needed. 04/04/24  Yes [provider]  OZEMPIC, 2 MG/DOSE, 8 MG/3ML SOPN Inject 2 mg into the skin once a week. 11/13/23  Yes [provider]  predniSONE  (DELTASONE ) 5 MG tablet Take 5 mg by mouth daily with breakfast.   Yes [provider]  pregabalin  (LYRICA ) 200 MG capsule Take 200 mg by mouth 3 (three) times daily. 03/22/24  Yes [provider]  simvastatin (ZOCOR) 20 MG tablet Take 20 mg by mouth every evening. 02/06/24  Yes [provider]  tamsulosin  (FLOMAX ) 0.4 MG CAPS capsule Take 0.4 mg by mouth daily. 03/22/24  Yes [provider]  atenolol (TENORMIN) 50 MG tablet Take 50 mg by mouth daily. Patient not taking: Reported on 04/15/2024 11/05/23   [provider]  BELBUCA  75 MCG FILM Take 75 mcg by mouth 2 (two) times daily. Patient not taking: Reported on 04/15/2024 11/20/23   [provider]  Cholecalciferol  (VITAMIN D ) 2000 units CAPS Take 1 capsule by mouth daily. Patient not taking: Reported on 04/15/2024    [provider]  Cholecalciferol  5000 units capsule Take 1 capsule (5,000 Units total) by mouth daily. Patient not taking: Reported on 04/15/2024 11/24/15   Signa Nest A, NP  glipiZIDE (GLUCOTROL) 10 MG tablet Take 10 mg by mouth daily. Patient not taking: Reported on 04/15/2024 11/05/23   [provider]  methotrexate (50 MG/ML) 1 g injection Inject into the vein once a week. Patient not taking: Reported on 04/15/2024    [provider]  methylPREDNISolone  (MEDROL  DOSEPAK) 4 MG TBPK tablet Take as directed Patient not taking: Reported  on 04/15/2024 01/14/24   Sikora, Rebecca, DPM  oxyCODONE  (OXY IR/ROXICODONE ) 5 MG immediate release tablet Take 7.5 mg by mouth every 6 (six) hours as needed. pain Patient not taking: Reported on 04/15/2024    [provider]  predniSONE  (STERAPRED UNI-PAK 21 TAB) 10 MG (21) TBPK tablet Take per package instructions Patient not taking: Reported on 04/15/2024 12/14/23   Chandra Harlene LABOR, NP  promethazine -dextromethorphan (PROMETHAZINE -DM) 6.25-15 MG/5ML syrup Take 5 mLs by mouth 4 (four) times daily as needed. Patient not taking: Reported on 12/11/2023 11/28/23   Stuart Vernell Norris, PA-C  propafenone (RYTHMOL SR) 225 MG 12 hr capsule Take 225 mg by mouth every 12 (twelve) hours. Patient not taking: Reported on 04/15/2024 03/17/24   [provider]  propafenone (RYTHMOL) 150 MG tablet Take 150 mg by mouth 3 (three) times daily. Patient not taking: Reported on 04/15/2024 03/17/24   [provider]  Cimetidine (HEARTBURN RELIEF PO) Take 1 tablet by mouth daily as needed (heartburn).  05/31/20  [provider]     Family History  Problem Relation Age of Onset   Breast cancer Mother    Diabetes Father    Heart failure Father    Migraines Sister    Scoliosis Daughter    ADD / ADHD Son    Cancer Maternal Grandfather        pancreatic   Diabetes Paternal Grandmother    Congestive Heart Failure Paternal Grandmother    ADD / ADHD Daughter    Scoliosis Daughter    Hyperlipidemia Daughter    Diabetes Daughter        prediabetes   Bipolar disorder Son    ADD / ADHD Son     Social History   Socioeconomic History   Marital status: Legally Separated    Spouse name: Not on file   Number of children: 4   Years of education: 11th grade   Highest education level: Not on file  Occupational History   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 Eats all food groups. Wear seatbelt.   Right-handed.   3 cans of soda per day.   Social Drivers of Corporate investment banker Strain: Not on file  Food Insecurity: No Food Insecurity (01/25/2018)   Hunger Vital Sign    Worried About Running Out of Food in the Last Year: Never true    Ran Out of Food in the Last Year: Never true  Transportation Needs: Unmet Transportation Needs (01/25/2018)   PRAPARE - Administrator, Civil Service (Medical): Yes    Lack of Transportation (Non-Medical): Yes  Physical Activity: Unknown (01/25/2018)   Exercise Vital Sign    Days of Exercise per Week: 3 days    Minutes of Exercise per Session: Not on file  Stress: Stress Concern Present (01/25/2018)   [employer] of Occupational Health - Occupational Stress Questionnaire    Feeling of Stress : Rather much  Social Connections: Unknown (03/07/2023)   [facility]   Social Network    Social Network: Not on file     Review of Systems: A 12 point ROS discussed and pertinent positives are indicated in the HPI above.  All other systems are negative.   Vital Signs: BP (!) 133/98   Pulse 99   Temp 98.2 F (36.8 C)   Resp 16   LMP 01/22/2011   SpO2 94%   Advance Care Plan: The advanced care place/surrogate decision maker was discussed at the time of visit and the patient did not wish to discuss or was not able to name a surrogate decision maker or provide an advance care plan.  Physical Exam Vitals reviewed.  Constitutional:      Appearance: Normal appearance.  HENT:     Head: Normocephalic and atraumatic.  Eyes:      Extraocular Movements: Extraocular movements intact.  Cardiovascular:     Rate and Rhythm: Normal  rate and regular rhythm.  Pulmonary:     Effort: Pulmonary effort is normal. No respiratory distress.     Breath sounds: Normal breath sounds.  Abdominal:     Palpations: Abdomen is soft.  Musculoskeletal:        General: Normal range of motion.     Cervical back: Normal range of motion.  Skin:    General: Skin is warm and dry.  Neurological:     General: No focal deficit present.     Mental Status: She is alert and oriented to person, place, and time.  Psychiatric:        Mood and Affect: Mood normal.        Behavior: Behavior normal.        Thought Content: Thought content normal.        Judgment: Judgment normal.     Imaging: CLINICAL DATA:  Abdominal/flank pain, stone suspected   EXAM: CT ABDOMEN AND PELVIS WITHOUT CONTRAST   TECHNIQUE: Multidetector CT imaging of the abdomen and pelvis was performed following the standard protocol without IV contrast.   RADIATION DOSE REDUCTION: This exam was performed according to the departmental dose-optimization program which includes automated exposure control, adjustment of the mA and/or kV according to patient size and/or use of iterative reconstruction technique.   COMPARISON:  09/10/2021   FINDINGS: Of note, the lack of intravenous contrast limits evaluation of the solid organ parenchyma and vascularity.   Lower chest: No focal airspace consolidation or pleural effusion.   Hepatobiliary: No mass.Cholecystectomy. No intrahepatic or extrahepatic biliary ductal dilation.   Pancreas: No mass or main ductal dilation. No peripancreatic inflammation or fluid collection.   Spleen: Normal size. No mass.   Adrenals/Urinary Tract: No adrenal masses. 2.5 cm right upper pole cyst. Moderate left-sided hydronephrosis due to an obstructive calculus in the proximal left ureter, measuring 9 x 11 x 14 mm. Punctate nonobstructive  bilateral calyceal calculi also present. The urinary bladder is completely decompressed.   Stomach/Bowel: The stomach is decompressed without focal abnormality. No small bowel wall thickening or inflammation. No small bowel obstruction.Normal appendix. Descending and sigmoid colonic diverticulosis. No changes of acute diverticulitis.   Vascular/Lymphatic: No aortic aneurysm. Diffuse aortoiliac atherosclerosis. No intraabdominal or pelvic lymphadenopathy.   Reproductive: fluid density structure in the right adnexa measuring 4.3 cm (axial 63). No free pelvic fluid.   Other: No pneumoperitoneum, ascites, or mesenteric inflammation.   Musculoskeletal: No acute fracture or destructive lesion. Multilevel thoracolumbar osteophytosis.   IMPRESSION: 1. Obstructive proximal left ureteral calculus, measuring 9 x 11 x 14 mm, causing moderate hydronephrosis. 2. Fluid density structure in the right adnexa, measuring 4.3 cm (axial 63), which smaller than on the prior study, possibly an ovarian cyst, paraovarian cyst, or chronic hydrosalpinx. 3. Total colonic diverticulosis. No changes of acute diverticulitis.   Aortic Atherosclerosis (ICD10-I70.0).     Electronically Signed   By: Rogelia Myers M.D.   On: 04/14/2024 22:10  Labs:  CBC: Recent Labs    04/14/24 2122  WBC 8.0  HGB 13.7  HCT 41.4  PLT 206    COAGS: No results for input(s): INR, APTT in the last 8760 hours.  BMP: Recent Labs    04/14/24 2122  NA 139  K 3.9  CL 100  CO2 30  GLUCOSE 111*  BUN 13  CALCIUM 9.7  CREATININE 1.24*  GFRNONAA 54*    LIVER FUNCTION TESTS: Recent Labs    04/14/24 2122  BILITOT 0.6  AST 15  ALT 16  ALKPHOS 70  PROT 6.5  ALBUMIN 3.6    TUMOR MARKERS: No results for input(s): AFPTM, CEA, CA199, CHROMGRNA in the last 8760 hours.  Assessment and Plan:  Obstructing left ureteral calculus with associated left hydroureter/hydronephrosis.  Will proceed with  image guided placement of a percutaneous nephrostomy tube on the left today by Dr. Jennefer.  Risks and benefits of left PCN placement was discussed with the patient including, but not limited to, infection, bleeding, significant bleeding causing loss or decrease in renal function or damage to adjacent structures.   All of the patient's questions were answered, patient is agreeable to proceed.  Consent signed and in chart.   Electronically Signed: SARI GORMAN LAMP, PA-C   04/15/2024, 9:05 AM      I spent a total of 40 Minutes   in face to face in clinical consultation, greater than 50% of which was counseling/coordinating care for PCN placement

## 2024-04-15 NOTE — Transfer of Care (Signed)
 Immediate Anesthesia Transfer of Care Note  Patient: Cassandra Lucas  Procedure(s) Performed: CYSTOSCOPY WITH RETROGRADE PYELOGRAM (Left: Ureter)  Patient Location: PACU  Anesthesia Type:General  Level of Consciousness: drowsy and patient cooperative  Airway & Oxygen  Therapy: Patient Spontanous Breathing and Patient connected to face mask oxygen   Post-op Assessment: Report given to RN and Post -op Vital signs reviewed and stable  Post vital signs: Reviewed and stable  Last Vitals:  Vitals Value Taken Time  BP 149/100 04/15/24 05:54  Temp    Pulse 108 04/15/24 05:56  Resp 16 04/15/24 05:56  SpO2 98 % 04/15/24 05:56  Vitals shown include unfiled device data.  Last Pain:  Vitals:   04/15/24 0003  TempSrc:   PainSc: 8          Complications: No notable events documented.

## 2024-04-15 NOTE — Progress Notes (Signed)
 No charge note.  Patient was admitted this morning.  Briefly 48 year old female with history of rheumatoid arthritis, COPD, ongoing tobacco abuse presented with left flank pain in the setting of known multiple nephrolithiasis.  Imaging confirmed left-sided hydronephrosis with left ureteral stone with a UA consistent with UTI.    Left ureteral stone with hydronephrosis and UTI -urology consulted, patient on empiric antibiotics, patient underwent failed attempt for ureteral stent.  IR subsequent consulted, now with nephrostomy tube. COPD with exacerbation wheezing on exam.  On Solu-Medrol  with breathing treatments as needed Tachycardia -    monitor shows sinus tachycardia.  Patient used to be on propafenone until 4 days ago which was held by cardiologist in anticipation of possible procedure for her renal stones.  Will check EKG and closely monitor.  Per patient patient's cardiologist told that she has to come back to the clinic for restarting the propafenone.  Heart rate stable at this time History of rheumatoid arthritis recently changed from methotrexate to leflunomide  due to repeated pneumonia.  Leflunomide  was held recently due to worsening renal function in the setting of renal stones.  Patient takes prednisone  10 mg daily along with hydroxychloroquine .  Patient on IV Solu-Medrol  which also will help with stress dose.   Acute renal failure with creatinine mildly increased from baseline.  Recheck basic metabolic panel in the morning Diabetes mellitus type 2 mentioned in the chart.  Continue sliding scale insulin  as needed   Garnette Pelt, MD [facility]

## 2024-04-15 NOTE — ED Notes (Signed)
 ED TO INPATIENT HANDOFF REPORT  Name/Age/Gender Cassandra Lucas 48 y.o. female  Code Status    Code Status Orders  (From admission, onward)           Start     Ordered   04/15/24 0304  Full code  Continuous       Question:  By:  Answer:  Consent: discussion documented in EHR   04/15/24 0304           Code Status History     This patient has a current code status but no historical code status.       Home/SNF/Other Home  Chief Complaint Ureteric stone [N20.1]  Level of Care/Admitting Diagnosis ED Disposition     ED Disposition  Admit   Condition  --   Comment  Hospital Area: [facility] [[id number]]  Level of Care: Progressive [102]  Admit to Progressive based on following criteria: MULTISYSTEM THREATS such as stable sepsis, metabolic/electrolyte imbalance with or without encephalopathy that is responding to early treatment.  May place patient in observation at [facility] or [facility] if equivalent level of care is available:: No  Covid Evaluation: Asymptomatic - no recent exposure (last 10 days) testing not required  Diagnosis: Ureteric stone [825933]  Admitting Physician: FRANKY REDIA SAILOR [[id number]]  Attending Physician: FRANKY REDIA SAILOR [phone]  For patients discharging to extended facilities (i.e. SNF, AL, group homes or LTAC) initiate:: Discharge to SNF/Facility Placement COVID-19 Lab Testing Protocol          Medical History Past Medical History:  Diagnosis Date   Anemia    Anxiety    Arthritis    Asthma    Carpal tunnel syndrome, bilateral 11/22/2015   Will try wrist splints   Community acquired pneumonia    COPD (chronic obstructive pulmonary disease) (HCC)    Decreased libido 03/26/2015   Diabetes mellitus without complication (HCC)    prediabetes   Dyslipidemia 11/24/2015   Empty sella (HCC)    Fibromyalgia    Fibromyalgia    Hot flashes 03/26/2015   Hyperlipidemia    Kidney stones    Productive cough     smoker   Renal disorder    Vitamin D  deficiency 11/24/2015    Allergies Allergies  Allergen Reactions   Nsaids Shortness Of Breath   Aspirin Other (See Comments)    Unknown childhood reaction   Flagyl  [Metronidazole ]    Macrobid [Nitrofurantoin Macrocrystal] Nausea And Vomiting    IV Location/Drains/Wounds Patient Lines/Drains/Airways Status     Active Line/Drains/Airways     Name Placement date Placement time Site Days   Peripheral IV 04/14/24 20 G Anterior;Proximal;Right Forearm 04/14/24  2122  Forearm  1            Labs/Imaging Results for orders placed or performed during the hospital encounter of 04/14/24 (from the past 48 hours)  Lipase, blood     Status: None   Collection Time: 04/14/24  9:22 PM  Result Value Ref Range   Lipase 35 11 - 51 U/L    Comment: Performed at [facility], [address] [place], [place] [address]  Comprehensive metabolic panel     Status: Abnormal   Collection Time: 04/14/24  9:22 PM  Result Value Ref Range   Sodium 139 135 - 145 mmol/L   Potassium 3.9 3.5 - 5.1 mmol/L   Chloride 100 98 - 111 mmol/L   CO2 30 22 - 32 mmol/L   Glucose, Bld 111 (H) 70 -  99 mg/dL    Comment: Glucose reference range applies only to samples taken after fasting for at least 8 hours.   BUN 13 6 - 20 mg/dL   Creatinine, Ser 8.75 (H) 0.44 - 1.00 mg/dL   Calcium 9.7 8.9 - 89.6 mg/dL   Total Protein 6.5 6.5 - 8.1 g/dL   Albumin 3.6 3.5 - 5.0 g/dL   AST 15 15 - 41 U/L   ALT 16 0 - 44 U/L   Alkaline Phosphatase 70 38 - 126 U/L   Total Bilirubin 0.6 0.0 - 1.2 mg/dL   GFR, Estimated 54 (L) >60 mL/min    Comment: (NOTE) Calculated using the CKD-EPI Creatinine Equation (2021)    Anion gap 9 5 - 15    Comment: Performed at [facility], [address] [place], [place] [address]  CBC     Status: None   Collection Time: 04/14/24  9:22 PM  Result Value Ref Range   WBC 8.0 4.0 - 10.5 K/uL   RBC 4.40 3.87 - 5.11 MIL/uL   Hemoglobin 13.7 12.0 - 15.0 g/dL    HCT 58.5 63.9 - 53.9 %   MCV 94.1 80.0 - 100.0 fL   MCH 31.1 26.0 - 34.0 pg   MCHC 33.1 30.0 - 36.0 g/dL   RDW 87.2 88.4 - 84.4 %   Platelets 206 150 - 400 K/uL   nRBC 0.0 0.0 - 0.2 %    Comment: Performed at [facility], [address], [place], [place] [address]  Urinalysis, Routine w reflex microscopic -Urine, Clean Catch     Status: Abnormal   Collection Time: 04/14/24 10:29 PM  Result Value Ref Range   Color, Urine YELLOW YELLOW   APPearance HAZY (A) CLEAR   Specific Gravity, Urine 1.012 1.005 - 1.030   pH 6.0 5.0 - 8.0   Glucose, UA NEGATIVE NEGATIVE mg/dL   Hgb urine dipstick LARGE (A) NEGATIVE   Bilirubin Urine NEGATIVE NEGATIVE   Ketones, ur NEGATIVE NEGATIVE mg/dL   Protein, ur 30 (A) NEGATIVE mg/dL   Nitrite NEGATIVE NEGATIVE   Leukocytes,Ua LARGE (A) NEGATIVE   RBC / HPF >50 0 - 5 RBC/hpf   WBC, UA >50 0 - 5 WBC/hpf   Bacteria, UA RARE (A) NONE SEEN   Squamous Epithelial / HPF 0-5 0 - 5 /HPF   Mucus PRESENT    Ca Oxalate Crys, UA PRESENT     Comment: Performed at [facility], [address] [place], [place] [address]   CT Renal Stone Study Result Date: 04/14/2024 CLINICAL DATA:  Abdominal/flank pain, stone suspected EXAM: CT ABDOMEN AND PELVIS WITHOUT CONTRAST TECHNIQUE: Multidetector CT imaging of the abdomen and pelvis was performed following the standard protocol without IV contrast. RADIATION DOSE REDUCTION: This exam was performed according to the departmental dose-optimization program which includes automated exposure control, adjustment of the mA and/or kV according to patient size and/or use of iterative reconstruction technique. COMPARISON:  09/10/2021 FINDINGS: Of note, the lack of intravenous contrast limits evaluation of the solid organ parenchyma and vascularity. Lower chest: No focal airspace consolidation or pleural effusion. Hepatobiliary: No mass.Cholecystectomy. No intrahepatic or extrahepatic biliary ductal dilation. Pancreas: No mass or main ductal  dilation. No peripancreatic inflammation or fluid collection. Spleen: Normal size. No mass. Adrenals/Urinary Tract: No adrenal masses. 2.5 cm right upper pole cyst. Moderate left-sided hydronephrosis due to an obstructive calculus in the proximal left ureter, measuring 9 x 11 x 14 mm. Punctate nonobstructive bilateral calyceal calculi also present. The urinary  bladder is completely decompressed. Stomach/Bowel: The stomach is decompressed without focal abnormality. No small bowel wall thickening or inflammation. No small bowel obstruction.Normal appendix. Descending and sigmoid colonic diverticulosis. No changes of acute diverticulitis. Vascular/Lymphatic: No aortic aneurysm. Diffuse aortoiliac atherosclerosis. No intraabdominal or pelvic lymphadenopathy. Reproductive: fluid density structure in the right adnexa measuring 4.3 cm (axial 63). No free pelvic fluid. Other: No pneumoperitoneum, ascites, or mesenteric inflammation. Musculoskeletal: No acute fracture or destructive lesion. Multilevel thoracolumbar osteophytosis. IMPRESSION: 1. Obstructive proximal left ureteral calculus, measuring 9 x 11 x 14 mm, causing moderate hydronephrosis. 2. Fluid density structure in the right adnexa, measuring 4.3 cm (axial 63), which smaller than on the prior study, possibly an ovarian cyst, paraovarian cyst, or chronic hydrosalpinx. 3. Total colonic diverticulosis. No changes of acute diverticulitis. Aortic Atherosclerosis (ICD10-I70.0). Electronically Signed   By: Rogelia Myers M.D.   On: 04/14/2024 22:10    Pending Labs Unresulted Labs (From admission, onward)     Start     Ordered   04/15/24 0303  HIV Antibody (routine testing w rflx)  (HIV Antibody (Routine testing w reflex) panel)  Once,   R        04/15/24 0304   04/15/24 0128  Blood culture (routine x 2)  BLOOD CULTURE X 2,   R (with STAT occurrences)      04/15/24 0128   04/14/24 2329  Urine Culture  Once,   URGENT       Question:  Indication  Answer:  Flank  Pain   04/14/24 2328            Vitals/Pain Today's Vitals   04/15/24 0000 04/15/24 0003 04/15/24 0117 04/15/24 0315  BP: (!) 154/99  (!) 153/109 (!) 129/95  Pulse: (!) 110  (!) 121 (!) 115  Resp: 18  18 17   Temp: 98.9 F (37.2 C)  98.6 F (37 C)   TempSrc: Oral     SpO2: 95%  100% 94%  PainSc:  8       Isolation Precautions No active isolations  Medications Medications  levalbuterol  (XOPENEX ) nebulizer solution 0.63 mg (has no administration in time range)  levalbuterol  (XOPENEX ) nebulizer solution 0.63 mg (has no administration in time range)  methylPREDNISolone  sodium succinate  (SOLU-MEDROL ) 40 mg/mL injection 40 mg (has no administration in time range)  HYDROmorphone  (DILAUDID ) injection 0.5 mg (0.5 mg Intravenous Given 04/14/24 2135)  ondansetron  (ZOFRAN ) injection 4 mg (4 mg Intravenous Given 04/14/24 2139)  HYDROmorphone  (DILAUDID ) injection 0.5 mg (0.5 mg Intravenous Given 04/14/24 2259)  ondansetron  (ZOFRAN ) injection 4 mg (4 mg Intravenous Given 04/14/24 2300)  cefTRIAXone  (ROCEPHIN ) 1 g in sodium chloride  0.9 % 100 mL IVPB (0 g Intravenous Stopped 04/15/24 0011)  fentaNYL  (SUBLIMAZE ) injection 50 mcg (50 mcg Intravenous Given 04/15/24 0003)  sodium chloride  0.9 % bolus 1,000 mL (1,000 mLs Intravenous New Bag/Given 04/15/24 0259)  hydrocortisone  sodium succinate  (SOLU-CORTEF ) 100 MG injection 50 mg (50 mg Intravenous Given 04/15/24 0258)  morphine  (PF) 4 MG/ML injection 4 mg (4 mg Intravenous Given 04/15/24 0302)    Mobility walks with person assist

## 2024-04-15 NOTE — ED Provider Notes (Signed)
  1:14 AM Arrives from [facility].  States still in a great deal of pain.  Morphine  has worked well for her in the past, ordered additional dose.  Dr. Roseann with urology was updated, will be in to see her in just a few minutes.  Requested medical admission.    Patient did express concerns about undergoing procedures as she does report history of sepsis following multiple procedures and surgeries in the past.  She does have hx of RA and is on weekly methotrexate as well as steroids.  Will add on set of blood cultures.  Discussed with hospitalist, Dr. Franky-- will admit for ongoing care.   Jarold Olam HERO, PA-C 04/15/24 0128    Raford Lenis, MD 04/15/24 [phone]

## 2024-04-15 NOTE — H&P (View-Only) (Signed)
 Urology Consult   Physician requesting consult: Carlin Gula, MD  Reason for consult: Left ureteral calculus, UTI  History of Present Illness: Cassandra Lucas is a 48 y.o. female who is seen in consultation from Dr. Gula for evaluation of a left ureteral calculus and UTI. She reports a long history of nephrolithiasis passing the majority of her stones spontaneously.  She presented to the emergency room yesterday with a 3-week history of left-sided flank pain.  Her pain has been worsening.  She has had some nausea without vomiting.  No fevers or chills. Creatinine 1.24 WBC 8.0 Urinalysis: >50 RBCs, >50 WBCs, rare bacteria, nitrite negative CT renal stone study shows a 9 x 11 x 14 mm stone in the left proximal ureter with associated left hydronephrosis.   Past Medical History:  Diagnosis Date   Anemia    Anxiety    Arthritis    Asthma    Carpal tunnel syndrome, bilateral 11/22/2015   Will try wrist splints   Community acquired pneumonia    COPD (chronic obstructive pulmonary disease) (HCC)    Decreased libido 03/26/2015   Diabetes mellitus without complication (HCC)    prediabetes   Dyslipidemia 11/24/2015   Empty sella (HCC)    Fibromyalgia    Fibromyalgia    Hot flashes 03/26/2015   Hyperlipidemia    Kidney stones    Productive cough    smoker   Renal disorder    Vitamin D  deficiency 11/24/2015    Past Surgical History:  Procedure Laterality Date   ABDOMINAL HYSTERECTOMY     has ovaries.    BREAST CYST EXCISION     CESAREAN SECTION  2006   [facility]   LAPAROSCOPIC CHOLECYSTECTOMY  2002   [facility]   LAPAROSCOPIC HYSTERECTOMY  04/12/2011   Procedure: HYSTERECTOMY TOTAL LAPAROSCOPIC;  Surgeon: Vonn VEAR Inch, MD;  Location: [facility];  Service: Gynecology;  Laterality: N/A;   THYROIDECTOMY, PARTIAL  2012   [facility], [facility]    Medications:  Scheduled Meds: Continuous Infusions:  sodium chloride      PRN Meds:.  Allergies:  Allergies  Allergen Reactions   Nsaids  Shortness Of Breath   Aspirin Other (See Comments)    Unknown childhood reaction   Flagyl  [Metronidazole ]    Macrobid [Nitrofurantoin Macrocrystal] Nausea And Vomiting    Family History  Problem Relation Age of Onset   Breast cancer Mother    Diabetes Father    Heart failure Father    Migraines Sister    Scoliosis Daughter    ADD / ADHD Son    Cancer Maternal Grandfather        pancreatic   Diabetes Paternal Grandmother    Congestive Heart Failure Paternal Grandmother    ADD / ADHD Daughter    Scoliosis Daughter    Hyperlipidemia Daughter    Diabetes Daughter        prediabetes   Bipolar disorder Son    ADD / ADHD Son     Social History:  reports that she has been smoking cigarettes. She has a 4.3 pack-year smoking history. She has never used smokeless tobacco. She reports that she does not drink alcohol and does not use drugs.  ROS: A complete review of systems was performed.  All systems are negative except for pertinent findings as noted.  Physical Exam:  Vital signs in last 24 hours: Temp:  [98.6 F (37 C)-98.9 F (37.2 C)] 98.6 F (37 C) (07/29 0117) Pulse Rate:  [110-127] 121 (07/29 0117) Resp:  [16-18]  18 (07/29 0117) BP: (143-159)/(96-109) 153/109 (07/29 0117) SpO2:  [93 %-100 %] 100 % (07/29 0117) GENERAL APPEARANCE:  Well appearing, well developed, well nourished, NAD HEENT:  Atraumatic, normocephalic, oropharynx clear NECK:  Supple without lymphadenopathy or thyromegaly ABDOMEN:  Soft, non-tender, no masses EXTREMITIES:  Moves all extremities well, without clubbing, cyanosis, or edema NEUROLOGIC:  Alert and oriented x 3, CN II-XII grossly intact MENTAL STATUS:  appropriate BACK:  Non-tender to palpation, No CVAT SKIN:  Warm, dry, and intact  Laboratory Data:  Recent Labs    04/14/24 2122  WBC 8.0  HGB 13.7  HCT 41.4  PLT 206    Recent Labs    04/14/24 2122  NA 139  K 3.9  CL 100  GLUCOSE 111*  BUN 13  CALCIUM 9.7  CREATININE 1.24*      Results for orders placed or performed during the hospital encounter of 04/14/24 (from the past 24 hours)  Lipase, blood     Status: None   Collection Time: 04/14/24  9:22 PM  Result Value Ref Range   Lipase 35 11 - 51 U/L  Comprehensive metabolic panel     Status: Abnormal   Collection Time: 04/14/24  9:22 PM  Result Value Ref Range   Sodium 139 135 - 145 mmol/L   Potassium 3.9 3.5 - 5.1 mmol/L   Chloride 100 98 - 111 mmol/L   CO2 30 22 - 32 mmol/L   Glucose, Bld 111 (H) 70 - 99 mg/dL   BUN 13 6 - 20 mg/dL   Creatinine, Ser 8.75 (H) 0.44 - 1.00 mg/dL   Calcium 9.7 8.9 - 89.6 mg/dL   Total Protein 6.5 6.5 - 8.1 g/dL   Albumin 3.6 3.5 - 5.0 g/dL   AST 15 15 - 41 U/L   ALT 16 0 - 44 U/L   Alkaline Phosphatase 70 38 - 126 U/L   Total Bilirubin 0.6 0.0 - 1.2 mg/dL   GFR, Estimated 54 (L) >60 mL/min   Anion gap 9 5 - 15  CBC     Status: None   Collection Time: 04/14/24  9:22 PM  Result Value Ref Range   WBC 8.0 4.0 - 10.5 K/uL   RBC 4.40 3.87 - 5.11 MIL/uL   Hemoglobin 13.7 12.0 - 15.0 g/dL   HCT 58.5 63.9 - 53.9 %   MCV 94.1 80.0 - 100.0 fL   MCH 31.1 26.0 - 34.0 pg   MCHC 33.1 30.0 - 36.0 g/dL   RDW 87.2 88.4 - 84.4 %   Platelets 206 150 - 400 K/uL   nRBC 0.0 0.0 - 0.2 %  Urinalysis, Routine w reflex microscopic -Urine, Clean Catch     Status: Abnormal   Collection Time: 04/14/24 10:29 PM  Result Value Ref Range   Color, Urine YELLOW YELLOW   APPearance HAZY (A) CLEAR   Specific Gravity, Urine 1.012 1.005 - 1.030   pH 6.0 5.0 - 8.0   Glucose, UA NEGATIVE NEGATIVE mg/dL   Hgb urine dipstick LARGE (A) NEGATIVE   Bilirubin Urine NEGATIVE NEGATIVE   Ketones, ur NEGATIVE NEGATIVE mg/dL   Protein, ur 30 (A) NEGATIVE mg/dL   Nitrite NEGATIVE NEGATIVE   Leukocytes,Ua LARGE (A) NEGATIVE   RBC / HPF >50 0 - 5 RBC/hpf   WBC, UA >50 0 - 5 WBC/hpf   Bacteria, UA RARE (A) NONE SEEN   Squamous Epithelial / HPF 0-5 0 - 5 /HPF   Mucus PRESENT    Ca Oxalate Crys,  UA PRESENT     No results found for this or any previous visit (from the past 240 hours).  Renal Function: Recent Labs    04/14/24 2122  CREATININE 1.24*   CrCl cannot be calculated (Unknown ideal weight.).  Radiologic Imaging: CT Renal Stone Study Result Date: 04/14/2024 CLINICAL DATA:  Abdominal/flank pain, stone suspected EXAM: CT ABDOMEN AND PELVIS WITHOUT CONTRAST TECHNIQUE: Multidetector CT imaging of the abdomen and pelvis was performed following the standard protocol without IV contrast. RADIATION DOSE REDUCTION: This exam was performed according to the departmental dose-optimization program which includes automated exposure control, adjustment of the mA and/or kV according to patient size and/or use of iterative reconstruction technique. COMPARISON:  09/10/2021 FINDINGS: Of note, the lack of intravenous contrast limits evaluation of the solid organ parenchyma and vascularity. Lower chest: No focal airspace consolidation or pleural effusion. Hepatobiliary: No mass.Cholecystectomy. No intrahepatic or extrahepatic biliary ductal dilation. Pancreas: No mass or main ductal dilation. No peripancreatic inflammation or fluid collection. Spleen: Normal size. No mass. Adrenals/Urinary Tract: No adrenal masses. 2.5 cm right upper pole cyst. Moderate left-sided hydronephrosis due to an obstructive calculus in the proximal left ureter, measuring 9 x 11 x 14 mm. Punctate nonobstructive bilateral calyceal calculi also present. The urinary bladder is completely decompressed. Stomach/Bowel: The stomach is decompressed without focal abnormality. No small bowel wall thickening or inflammation. No small bowel obstruction.Normal appendix. Descending and sigmoid colonic diverticulosis. No changes of acute diverticulitis. Vascular/Lymphatic: No aortic aneurysm. Diffuse aortoiliac atherosclerosis. No intraabdominal or pelvic lymphadenopathy. Reproductive: fluid density structure in the right adnexa measuring 4.3 cm (axial 63).  No free pelvic fluid. Other: No pneumoperitoneum, ascites, or mesenteric inflammation. Musculoskeletal: No acute fracture or destructive lesion. Multilevel thoracolumbar osteophytosis. IMPRESSION: 1. Obstructive proximal left ureteral calculus, measuring 9 x 11 x 14 mm, causing moderate hydronephrosis. 2. Fluid density structure in the right adnexa, measuring 4.3 cm (axial 63), which smaller than on the prior study, possibly an ovarian cyst, paraovarian cyst, or chronic hydrosalpinx. 3. Total colonic diverticulosis. No changes of acute diverticulitis. Aortic Atherosclerosis (ICD10-I70.0). Electronically Signed   By: Rogelia Myers M.D.   On: 04/14/2024 22:10    I independently reviewed the above imaging studies.  Impression/Recommendation Left ureteral calculus UTI  Diagnosis and management of a left ureteral calculus in the setting of a possible UTI discussed with the patient.  Recommendations for surgical management with cystoscopy and left ureteral stent placement discussed with the patient.  I also discussed the alternative option of a percutaneous nephrostomy tube.  Risk and benefits discussed.  She would like to proceed with cystoscopy, left retrograde pyelogram, and insertion of left ureteral stent as soon as possible.  Procedure: The patient will be scheduled for cystoscopy, left retrograde pyelogram, insertion of left ureteral stent at [facility].  Surgical request is placed with the surgery schedulers and will be scheduled at the patient's/family request. Informed consent is given as documented below. Anesthesia: General  The patient does not have sleep apnea, history of MRSA, history of VRE, history of cardiac device requiring special anesthetic needs. Patient is stable and considered clear for surgical management in an outpatient ambulatory surgery setting as well as inpatient hospital setting.  Consent for Operation or Procedure: Provider Certification I hereby certify that the  nature, purpose, benefits, usual and most frequent risks of, and alternatives to, the operation or procedure have been explained to the patient (or person authorized to sign for the patient) either by me as responsible physician or by the  provider who is to perform the operation or procedure. Time spent such that the patient/family has had an opportunity to ask questions, and that those questions have been answered. The patient or the patient's representative has been advised that selected tasks may be performed by assistants to the primary health care provider(s). I believe that the patient (or person authorized to sign for the patient) understands what has been explained, and has consented to the operation or procedure. No guarantees were implied or made.   Adine Manly 04/15/2024, 1:37 AM

## 2024-04-15 NOTE — Interval H&P Note (Signed)
 History and Physical Interval Note:  04/15/2024 4:34 AM  Cassandra Lucas  has presented today for surgery, with the diagnosis of Lt Ureteral Calculi, UTI.  The various methods of treatment have been discussed with the patient and family. After consideration of risks, benefits and other options for treatment, the patient has consented to  Procedure(s): CYSTOURETEROSCOPY, WITH RETROGRADE PYELOGRAM AND STENT INSERTION (Left) as a surgical intervention.  The patient's history has been reviewed, patient examined, no change in status, stable for surgery.  I have reviewed the patient's chart and labs.  Questions were answered to the patient's satisfaction.     Adine Manly

## 2024-04-15 NOTE — Anesthesia Preprocedure Evaluation (Signed)
 Anesthesia Evaluation  Patient identified by MRN, date of birth, ID band Patient awake    Reviewed: Allergy & Precautions, H&P , NPO status , Patient's Chart, lab work & pertinent test results  Airway Mallampati: II   Neck ROM: full    Dental   Pulmonary asthma , COPD, Current Smoker   breath sounds clear to auscultation       Cardiovascular negative cardio ROS  Rhythm:regular Rate:Normal     Neuro/Psych  PSYCHIATRIC DISORDERS Anxiety        GI/Hepatic   Endo/Other  diabetes, Type 2  Class 3 obesity  Renal/GU Renal diseasestones     Musculoskeletal  (+) Arthritis ,  Fibromyalgia -  Abdominal   Peds  Hematology   Anesthesia Other Findings   Reproductive/Obstetrics                              Anesthesia Physical Anesthesia Plan  ASA: 2  Anesthesia Plan: General   Post-op Pain Management:    Induction: Intravenous  PONV Risk Score and Plan: 2 and Ondansetron , Dexamethasone , Midazolam  and Treatment may vary due to age or medical condition  Airway Management Planned: Oral ETT  Additional Equipment:   Intra-op Plan:   Post-operative Plan: Extubation in OR  Informed Consent: I have reviewed the patients History and Physical, chart, labs and discussed the procedure including the risks, benefits and alternatives for the proposed anesthesia with the patient or authorized representative who has indicated his/her understanding and acceptance.     Dental advisory given  Plan Discussed with: CRNA, Anesthesiologist and Surgeon  Anesthesia Plan Comments:         Anesthesia Quick Evaluation

## 2024-04-15 NOTE — H&P (Signed)
 History and Physical    Cassandra Lucas FMW:[id number] DOB: 1976/08/15 DOA: 04/14/2024  Patient coming from: Home.  Chief Complaint: Left flank pain.  HPI: Cassandra Lucas is a 48 y.o. female with history of rheumatoid arthritis, COPD with ongoing tobacco abuse, tachycardia presents to the ER with complaints of worsening left flank pain.  Patient has been having ongoing pain in the left flank for the last 2 to 3 weeks.  Has had prior history of multiple nephrolithiasis.  Due to acute worsening of pain patient presents to the ER.  Denies any nausea or vomiting.  Has been having increasing wheezing last few days.  Patient's rheumatologist advised patient to hold off leflunomide  due to recent diagnosis of kidney stones until it gets corrected.  Also patient's cardiologist at [facility] advised patient to hold off patient's propafenone which patient takes it for palpitations not sure what kind of arrhythmia patient was taking it for.  ED Course: In the ER CT scan shows left-sided hydronephrosis with left ureteral stone and UA is concerning for UTI.  Dr. Roseann urologist was consulted patient was taken to the OR.  Labs show WBC of 8 creatinine 1.2.  On exam patient is diffusely wheezing.  Review of Systems: As per HPI, rest all negative.   Past Medical History:  Diagnosis Date   Anemia    Anxiety    Arthritis    Asthma    Carpal tunnel syndrome, bilateral 11/22/2015   Will try wrist splints   Community acquired pneumonia    COPD (chronic obstructive pulmonary disease) (HCC)    Decreased libido 03/26/2015   Diabetes mellitus without complication (HCC)    prediabetes   Dyslipidemia 11/24/2015   Empty sella (HCC)    Fibromyalgia    Fibromyalgia    Hot flashes 03/26/2015   Hyperlipidemia    Kidney stones    Productive cough    smoker   Renal disorder    Vitamin D  deficiency 11/24/2015    Past Surgical History:  Procedure Laterality Date   ABDOMINAL  HYSTERECTOMY     has ovaries.    BREAST CYST EXCISION     CESAREAN SECTION  2006   [facility]   LAPAROSCOPIC CHOLECYSTECTOMY  2002   [facility]   LAPAROSCOPIC HYSTERECTOMY  04/12/2011   Procedure: HYSTERECTOMY TOTAL LAPAROSCOPIC;  Surgeon: Vonn VEAR Inch, MD;  Location: [facility];  Service: Gynecology;  Laterality: N/A;   THYROIDECTOMY, PARTIAL  2012   [facility], [facility]     reports that she has been smoking cigarettes. She has a 4.3 pack-year smoking history. She has never used smokeless tobacco. She reports that she does not drink alcohol and does not use drugs.  Allergies  Allergen Reactions   Nsaids Shortness Of Breath   Aspirin Other (See Comments)    Unknown childhood reaction   Flagyl  [Metronidazole ]    Macrobid [Nitrofurantoin Macrocrystal] Nausea And Vomiting    Family History  Problem Relation Age of Onset   Breast cancer Mother    Diabetes Father    Heart failure Father    Migraines Sister    Scoliosis Daughter    ADD / ADHD Son    Cancer Maternal Grandfather        pancreatic   Diabetes Paternal Grandmother    Congestive Heart Failure Paternal Grandmother    ADD / ADHD Daughter    Scoliosis Daughter    Hyperlipidemia Daughter    Diabetes Daughter        prediabetes  Bipolar disorder Son    ADD / ADHD Son     Prior to Admission medications   Medication Sig Start Date End Date Taking? Authorizing Provider  BELBUCA  150 MCG FILM Take 150 mcg by mouth 2 (two) times daily. 12/07/23  Yes [provider]  leflunomide  (ARAVA ) 10 MG tablet Take 10 mg by mouth daily. 04/10/24  Yes [provider]  OHTUVAYRE  3 MG/2.5ML SUSP  04/02/24  Yes [provider]  oxyCODONE -acetaminophen  (PERCOCET) 10-325 MG tablet Take 1 tablet by mouth 3 (three) times daily as needed. 04/04/24  Yes [provider]  propafenone (RYTHMOL SR) 225 MG 12 hr capsule Take 225 mg by mouth every 12 (twelve) hours. 03/17/24  Yes [provider]  propafenone (RYTHMOL) 150 MG tablet  Take 150 mg by mouth 3 (three) times daily. 03/17/24  Yes [provider]  tamsulosin  (FLOMAX ) 0.4 MG CAPS capsule Take 0.4 mg by mouth daily. 03/22/24  Yes [provider]  acetaminophen  (TYLENOL ) 500 MG tablet Take 1,000 mg by mouth daily as needed for mild pain.     [provider]  albuterol  (PROVENTIL ) (2.5 MG/3ML) 0.083% nebulizer solution Take 2.5 mg by nebulization every 6 (six) hours as needed for wheezing or shortness of breath.    [provider]  albuterol  (VENTOLIN  HFA) 108 (90 Base) MCG/ACT inhaler Inhale 1-2 puffs into the lungs every 6 (six) hours as needed for wheezing or shortness of breath. 07/31/21   Stuart Vernell Norris, PA-C  alendronate (FOSAMAX) 70 MG tablet Take 70 mg by mouth once a week. 09/15/22   [provider]  atenolol (TENORMIN) 50 MG tablet Take 50 mg by mouth daily. 11/05/23   [provider]  BELBUCA  75 MCG FILM Take 75 mcg by mouth 2 (two) times daily. Patient not taking: Reported on 04/15/2024 11/20/23   [provider]  budeson-glycopyrrolate -formoterol (BREZTRI  AEROSPHERE) 160-9-4.8 MCG/ACT AERO Inhale 2 puffs into the lungs in the morning and at bedtime. 12/11/23   Neysa Reggy BIRCH, MD  cetirizine  (ZYRTEC  ALLERGY) 10 MG tablet Take 1 tablet (10 mg total) by mouth daily. 07/31/21   Stuart Vernell Norris, PA-C  Cholecalciferol  (VITAMIN D ) 2000 units CAPS Take 1 capsule by mouth daily.    [provider]  Cholecalciferol  5000 units capsule Take 1 capsule (5,000 Units total) by mouth daily. 11/24/15   Signa Delon LABOR, NP  cyclobenzaprine  (FLEXERIL ) 10 MG tablet Take 10 mg by mouth 3 (three) times daily as needed. 07/22/21   [provider]  famotidine  (PEPCID ) 40 MG tablet Take 40 mg by mouth daily. 11/28/23   [provider]  fluticasone  (FLONASE ) 50 MCG/ACT nasal spray Place 1 spray into both nostrils 2 (two) times daily. 07/31/21   Stuart Vernell Norris, PA-C  folic acid   (FOLVITE ) 1 MG tablet Take 1 tablet by mouth daily. 07/09/19   [provider]  glipiZIDE (GLUCOTROL) 10 MG tablet Take 10 mg by mouth daily. 11/05/23   [provider]  hydroxychloroquine  (PLAQUENIL ) 200 MG tablet Take 400 mg by mouth daily.    [provider]  Ipratropium-Albuterol  (COMBIVENT RESPIMAT) 20-100 MCG/ACT AERS respimat Inhale 1 puff into the lungs every 6 (six) hours as needed for wheezing or shortness of breath. 10/23/18   [provider]  methotrexate (50 MG/ML) 1 g injection Inject into the vein once a week.    [provider]  methylPREDNISolone  (MEDROL  DOSEPAK) 4 MG TBPK tablet Take as directed 01/14/24   Sikora, Rebecca, DPM  oxyCODONE  (OXY IR/ROXICODONE ) 5 MG immediate release tablet Take 7.5 mg by mouth every 6 (six) hours as needed. pain    [provider]  OZEMPIC, 2 MG/DOSE, 8 MG/3ML SOPN Inject 2 mg into the skin once a week. 11/13/23   [provider]  predniSONE  (DELTASONE ) 10 MG tablet Take 10 mg by mouth daily with breakfast.    [provider]  predniSONE  (STERAPRED UNI-PAK 21 TAB) 10 MG (21) TBPK tablet Take per package instructions 12/14/23   Chandra Harlene LABOR, NP  pregabalin  (LYRICA ) 150 MG capsule Take 150 mg by mouth 3 (three) times daily. 07/01/21   [provider]  promethazine -dextromethorphan (PROMETHAZINE -DM) 6.25-15 MG/5ML syrup Take 5 mLs by mouth 4 (four) times daily as needed. Patient not taking: Reported on 12/11/2023 11/28/23   Stuart Vernell Norris, PA-C  simvastatin (ZOCOR) 10 MG tablet Take 10 mg by mouth at bedtime. 06/11/21   [provider]  Cimetidine (HEARTBURN RELIEF PO) Take 1 tablet by mouth daily as needed (heartburn).  05/31/20  [provider]    Physical Exam: Constitutional: Moderately built and nourished. Vitals:   04/14/24 2059 04/14/24 2115 04/15/24 0000 04/15/24 0117  BP: (!) 159/104 (!) 143/96 (!) 154/99 (!) 153/109  Pulse: (!) 127 (!)  115 (!) 110 (!) 121  Resp: 17 16 18 18   Temp: 98.7 F (37.1 C)  98.9 F (37.2 C) 98.6 F (37 C)  TempSrc:   Oral   SpO2: 95% 93% 95% 100%   Eyes: Anicteric no pallor. ENMT: No discharge from the ears eyes nose or mouth. Neck: No mass felt.  No neck rigidity. Respiratory: Bilateral expiratory wheezes are no crepitations. Cardiovascular: S1-S2 heard. Abdomen: Soft nontender bowel sound present. Musculoskeletal: No edema. Skin: No rash. Neurologic: Alert awake oriented to time place and person.  Moves all extremities. Psychiatric: Appears normal.  Normal affect.   Labs on Admission: I have personally reviewed following labs and imaging studies  CBC: Recent Labs  Lab 04/14/24 2122  WBC 8.0  HGB 13.7  HCT 41.4  MCV 94.1  PLT 206   Basic Metabolic Panel: Recent Labs  Lab 04/14/24 2122  NA 139  K 3.9  CL 100  CO2 30  GLUCOSE 111*  BUN 13  CREATININE 1.24*  CALCIUM 9.7   GFR: CrCl cannot be calculated (Unknown ideal weight.). Liver Function Tests: Recent Labs  Lab 04/14/24 2122  AST 15  ALT 16  ALKPHOS 70  BILITOT 0.6  PROT 6.5  ALBUMIN 3.6   Recent Labs  Lab 04/14/24 2122  LIPASE 35   No results for input(s): AMMONIA in the last 168 hours. Coagulation Profile: No results for input(s): INR, PROTIME in the last 168 hours. Cardiac Enzymes: No results for input(s): CKTOTAL, CKMB, CKMBINDEX, TROPONINI in the last 168 hours. BNP (last 3 results) No results for input(s): PROBNP in the last 8760 hours. HbA1C: No results for input(s): HGBA1C in the last 72 hours. CBG: No results for input(s): GLUCAP in the last 168 hours. Lipid Profile: No results for input(s): CHOL, HDL, LDLCALC, TRIG, CHOLHDL, LDLDIRECT in the last 72 hours. Thyroid  Function Tests: No results for input(s): TSH, T4TOTAL, FREET4, T3FREE, THYROIDAB in the last 72 hours. Anemia Panel: No results for input(s): VITAMINB12, FOLATE, FERRITIN,  TIBC, IRON, RETICCTPCT in the last 72 hours. Urine analysis:    Component Value Date/Time   COLORURINE YELLOW 04/14/2024 2229   APPEARANCEUR HAZY (A) 04/14/2024 2229   LABSPEC 1.012 04/14/2024 2229   PHURINE 6.0 04/14/2024  2229   GLUCOSEU NEGATIVE 04/14/2024 2229   HGBUR LARGE (A) 04/14/2024 2229   BILIRUBINUR NEGATIVE 04/14/2024 2229   BILIRUBINUR negative 07/11/2021 1050   KETONESUR NEGATIVE 04/14/2024 2229   PROTEINUR 30 (A) 04/14/2024 2229   UROBILINOGEN 0.2 07/11/2021 1050   UROBILINOGEN 0.2 07/17/2013 1840   NITRITE NEGATIVE 04/14/2024 2229   LEUKOCYTESUR LARGE (A) 04/14/2024 2229   Sepsis Labs: @LABRCNTIP (procalcitonin:4,lacticidven:4) )No results found for this or any previous visit (from the past 240 hours).   Radiological Exams on Admission: CT Renal Stone Study Result Date: 04/14/2024 CLINICAL DATA:  Abdominal/flank pain, stone suspected EXAM: CT ABDOMEN AND PELVIS WITHOUT CONTRAST TECHNIQUE: Multidetector CT imaging of the abdomen and pelvis was performed following the standard protocol without IV contrast. RADIATION DOSE REDUCTION: This exam was performed according to the departmental dose-optimization program which includes automated exposure control, adjustment of the mA and/or kV according to patient size and/or use of iterative reconstruction technique. COMPARISON:  09/10/2021 FINDINGS: Of note, the lack of intravenous contrast limits evaluation of the solid organ parenchyma and vascularity. Lower chest: No focal airspace consolidation or pleural effusion. Hepatobiliary: No mass.Cholecystectomy. No intrahepatic or extrahepatic biliary ductal dilation. Pancreas: No mass or main ductal dilation. No peripancreatic inflammation or fluid collection. Spleen: Normal size. No mass. Adrenals/Urinary Tract: No adrenal masses. 2.5 cm right upper pole cyst. Moderate left-sided hydronephrosis due to an obstructive calculus in the proximal left ureter, measuring 9 x 11 x 14 mm.  Punctate nonobstructive bilateral calyceal calculi also present. The urinary bladder is completely decompressed. Stomach/Bowel: The stomach is decompressed without focal abnormality. No small bowel wall thickening or inflammation. No small bowel obstruction.Normal appendix. Descending and sigmoid colonic diverticulosis. No changes of acute diverticulitis. Vascular/Lymphatic: No aortic aneurysm. Diffuse aortoiliac atherosclerosis. No intraabdominal or pelvic lymphadenopathy. Reproductive: fluid density structure in the right adnexa measuring 4.3 cm (axial 63). No free pelvic fluid. Other: No pneumoperitoneum, ascites, or mesenteric inflammation. Musculoskeletal: No acute fracture or destructive lesion. Multilevel thoracolumbar osteophytosis. IMPRESSION: 1. Obstructive proximal left ureteral calculus, measuring 9 x 11 x 14 mm, causing moderate hydronephrosis. 2. Fluid density structure in the right adnexa, measuring 4.3 cm (axial 63), which smaller than on the prior study, possibly an ovarian cyst, paraovarian cyst, or chronic hydrosalpinx. 3. Total colonic diverticulosis. No changes of acute diverticulitis. Aortic Atherosclerosis (ICD10-I70.0). Electronically Signed   By: Rogelia Myers M.D.   On: 04/14/2024 22:10     Assessment/Plan Principal Problem:   Ureteric stone Active Problems:   Rheumatoid arthritis involving multiple sites with positive rheumatoid factor (HCC)   Ureterolithiasis   Lower urinary tract infectious disease   COPD with acute exacerbation (HCC)   Tachycardia    Left ureteral stone with hydronephrosis and UTI - appreciate urology consult.  Patient is going to the OR.  On antibiotics.  Follow cultures. COPD with exacerbation on exam patient is having wheezing.  Will keep patient on IV Solu-Medrol  and Xopenex . Tachycardia -    monitor shows sinus tachycardia.  Patient used to be on propafenone until 4 days ago which was held by cardiologist in anticipation of possible procedure for  her renal stones.  Will check EKG and closely monitor.  Per patient patient's cardiologist told that she has to come back to the clinic for restarting the propafenone. History of rheumatoid arthritis recently changed from methotrexate to leflunomide  due to repeated pneumonia.  Leflunomide  was held recently due to worsening renal function in the setting of renal stones.  Patient takes prednisone  10 mg daily  along with hydroxychloroquine .  Patient on IV Solu-Medrol  which also will help with stress dose.   Acute renal failure with creatinine mildly increased from baseline.  Follow metabolic panel after hydration. Diabetes mellitus type 2 mentioned in the chart.  Check hemoglobin A1c.  Since patient has left-sided ureteric stone with hydronephrosis and UTI will need further management and more than 2 midnight stay.   DVT prophylaxis: SCDs. Code Status: Full code. Family Communication: Family at the bedside. Disposition Plan: Progressive care. Consults called: Urologist. Admission status: Observation.

## 2024-04-16 ENCOUNTER — Inpatient Hospital Stay (HOSPITAL_COMMUNITY)

## 2024-04-16 ENCOUNTER — Encounter (HOSPITAL_COMMUNITY): Payer: Self-pay | Admitting: Urology

## 2024-04-16 DIAGNOSIS — Z936 Other artificial openings of urinary tract status: Secondary | ICD-10-CM | POA: Diagnosis not present

## 2024-04-16 DIAGNOSIS — M0579 Rheumatoid arthritis with rheumatoid factor of multiple sites without organ or systems involvement: Secondary | ICD-10-CM | POA: Diagnosis not present

## 2024-04-16 DIAGNOSIS — Q6211 Congenital occlusion of ureteropelvic junction: Secondary | ICD-10-CM | POA: Diagnosis not present

## 2024-04-16 DIAGNOSIS — N201 Calculus of ureter: Secondary | ICD-10-CM | POA: Diagnosis not present

## 2024-04-16 DIAGNOSIS — N3000 Acute cystitis without hematuria: Secondary | ICD-10-CM

## 2024-04-16 DIAGNOSIS — J441 Chronic obstructive pulmonary disease with (acute) exacerbation: Secondary | ICD-10-CM | POA: Diagnosis not present

## 2024-04-16 LAB — CBC
HCT: 40.6 % (ref 36.0–46.0)
Hemoglobin: 12.7 g/dL (ref 12.0–15.0)
MCH: 30.4 pg (ref 26.0–34.0)
MCHC: 31.3 g/dL (ref 30.0–36.0)
MCV: 97.1 fL (ref 80.0–100.0)
Platelets: 191 K/uL (ref 150–400)
RBC: 4.18 MIL/uL (ref 3.87–5.11)
RDW: 12.5 % (ref 11.5–15.5)
WBC: 12.1 K/uL — ABNORMAL HIGH (ref 4.0–10.5)
nRBC: 0 % (ref 0.0–0.2)

## 2024-04-16 LAB — COMPREHENSIVE METABOLIC PANEL WITH GFR
ALT: 13 U/L (ref 0–44)
AST: 15 U/L (ref 15–41)
Albumin: 3.1 g/dL — ABNORMAL LOW (ref 3.5–5.0)
Alkaline Phosphatase: 61 U/L (ref 38–126)
Anion gap: 8 (ref 5–15)
BUN: 22 mg/dL — ABNORMAL HIGH (ref 6–20)
CO2: 23 mmol/L (ref 22–32)
Calcium: 8.9 mg/dL (ref 8.9–10.3)
Chloride: 108 mmol/L (ref 98–111)
Creatinine, Ser: 1.13 mg/dL — ABNORMAL HIGH (ref 0.44–1.00)
GFR, Estimated: >60 mL/min (ref >60)
Glucose, Bld: 175 mg/dL — ABNORMAL HIGH (ref 70–99)
Potassium: 4.3 mmol/L (ref 3.5–5.1)
Sodium: 139 mmol/L (ref 135–145)
Total Bilirubin: 0.6 mg/dL (ref 0.0–1.2)
Total Protein: 6 g/dL — ABNORMAL LOW (ref 6.5–8.1)

## 2024-04-16 LAB — BRAIN NATRIURETIC PEPTIDE: B Natriuretic Peptide: 51.3 pg/mL (ref 0.0–100.0)

## 2024-04-16 LAB — URINE CULTURE: Culture: <10000 — AB

## 2024-04-16 LAB — GLUCOSE, CAPILLARY
Glucose-Capillary: 273 mg/dL — ABNORMAL HIGH (ref 70–99)
Glucose-Capillary: 196 mg/dL — ABNORMAL HIGH (ref 70–99)
Glucose-Capillary: 240 mg/dL — ABNORMAL HIGH (ref 70–99)

## 2024-04-16 MED ORDER — FOLIC ACID 1 MG PO TABS
1.0000 mg | ORAL_TABLET | ORAL | Status: DC
  Administered 2024-04-16: 1 mg via ORAL
  Filled 2024-04-16 (×2): qty 1

## 2024-04-16 MED ORDER — FAMOTIDINE 20 MG PO TABS
40.0000 mg | ORAL_TABLET | Freq: Every day | ORAL | Status: DC
  Administered 2024-04-16 – 2024-04-17 (×2): 40 mg via ORAL
  Filled 2024-04-16 (×2): qty 2

## 2024-04-16 MED ORDER — ARFORMOTEROL TARTRATE 15 MCG/2ML IN NEBU
15.0000 ug | INHALATION_SOLUTION | Freq: Two times a day (BID) | RESPIRATORY_TRACT | Status: DC
  Administered 2024-04-16 – 2024-04-17 (×3): 15 ug via RESPIRATORY_TRACT
  Filled 2024-04-16 (×3): qty 2

## 2024-04-16 MED ORDER — LORATADINE 10 MG PO TABS
10.0000 mg | ORAL_TABLET | Freq: Every day | ORAL | Status: DC
  Administered 2024-04-16 – 2024-04-17 (×2): 10 mg via ORAL
  Filled 2024-04-16 (×2): qty 1

## 2024-04-16 MED ORDER — BUDESONIDE 0.5 MG/2ML IN SUSP
0.5000 mg | Freq: Two times a day (BID) | RESPIRATORY_TRACT | Status: DC
  Administered 2024-04-16 – 2024-04-17 (×3): 0.5 mg via RESPIRATORY_TRACT
  Filled 2024-04-16 (×3): qty 2

## 2024-04-16 MED ORDER — BUPRENORPHINE HCL 150 MCG BU FILM: 150.0000 ug | ORAL_FILM | Freq: Two times a day (BID) | BUCCAL | Status: DC

## 2024-04-16 MED ORDER — FLUTICASONE PROPIONATE 50 MCG/ACT NA SUSP
1.0000 | Freq: Two times a day (BID) | NASAL | Status: DC
  Filled 2024-04-16: qty 16

## 2024-04-16 MED ORDER — SODIUM CHLORIDE 0.9 % IV SOLN: INTRAVENOUS | Status: DC

## 2024-04-16 MED ORDER — SODIUM CHLORIDE 0.9 % IV SOLN
2.0000 g | INTRAVENOUS | Status: DC
  Administered 2024-04-16: 2 g via INTRAVENOUS
  Filled 2024-04-16: qty 20

## 2024-04-16 MED ORDER — TAMSULOSIN HCL 0.4 MG PO CAPS
0.4000 mg | ORAL_CAPSULE | Freq: Every day | ORAL | Status: DC
  Administered 2024-04-16 – 2024-04-17 (×2): 0.4 mg via ORAL
  Filled 2024-04-16 (×2): qty 1

## 2024-04-16 MED ORDER — PANTOPRAZOLE SODIUM 40 MG PO TBEC
40.0000 mg | DELAYED_RELEASE_TABLET | Freq: Every day | ORAL | Status: DC
  Administered 2024-04-16 – 2024-04-17 (×2): 40 mg via ORAL
  Filled 2024-04-16 (×2): qty 1

## 2024-04-16 MED ORDER — IPRATROPIUM BROMIDE 0.02 % IN SOLN
0.5000 mg | Freq: Four times a day (QID) | RESPIRATORY_TRACT | Status: DC
  Administered 2024-04-16 – 2024-04-17 (×4): 0.5 mg via RESPIRATORY_TRACT
  Filled 2024-04-16 (×4): qty 2.5

## 2024-04-16 NOTE — Anesthesia Postprocedure Evaluation (Signed)
 Anesthesia Post Note  Patient: Cassandra Lucas  Procedure(s) Performed: CYSTOSCOPY WITH RETROGRADE PYELOGRAM (Left: Ureter)     Patient location during evaluation: PACU Anesthesia Type: General Level of consciousness: awake and alert Pain management: pain level controlled Vital Signs Assessment: post-procedure vital signs reviewed and stable Respiratory status: spontaneous breathing, nonlabored ventilation, respiratory function stable and patient connected to nasal cannula oxygen  Cardiovascular status: blood pressure returned to baseline and stable Postop Assessment: no apparent nausea or vomiting Anesthetic complications: no   No notable events documented.  Last Vitals:  Vitals:   04/16/24 0853 04/16/24 0937  BP:  137/82  Pulse:  (!) 110  Resp:    Temp:  36.9 C  SpO2: 95% 96%    Last Pain:  Vitals:   04/16/24 0937  TempSrc: Oral  PainSc: 0-No pain                 Kieryn Burtis S

## 2024-04-16 NOTE — Progress Notes (Signed)
 PROGRESS NOTE    Cassandra Lucas  FMW:[id number] DOB: Jan 05, 1976 DOA: 04/14/2024 PCP: [facility]    Chief Complaint  Patient presents with   Flank Pain    Brief Narrative:  Patient 48 year old female history of rheumatoid arthritis, COPD, ongoing tobacco abuse presented with left flank pain in the setting of known multiple nephrolithiasis.  Imaging concerning for left-sided hydronephrosis with left ureteral stone and urinalysis consistent with UTI. Patient seen in consultation by urology and patient underwent cystoscopy, left retrograde pyelogram with intraoperative interpretation with difficulty passing guidewire beyond the calculus and as such IR consulted for nephrostomy tube placement which was done on 04/15/2024.   Assessment & Plan:   Principal Problem:   Ureteric stone Active Problems:   Rheumatoid arthritis involving multiple sites with positive rheumatoid factor (HCC)   Ureterolithiasis   Lower urinary tract infectious disease   COPD with acute exacerbation (HCC)   Tachycardia   Hydronephrosis   Acute cystitis without hematuria  #1 left ureteral stone with left hydronephrosis/UTI/ - Patient presented with left flank pain, in the setting of nephrolithiasis, urinalysis concerning for UTI. - Patient underwent failed attempt for ureteral stent placement prior urology and as such IR consulted and patient underwent percutaneous left nephrostomy tube placement per IR on 04/15/2024. - Blood cultures with no growth to date. - Urine cultures with insignificant growth. - Continue empiric IV Rocephin  and treat empirically for 7 to 10-day course of antibiotic treatment. - Urology and IR following and appreciate input and recommendations.  2.  Acute COPD with exacerbation -Patient noted on presentation to have some wheezing on examination. - Continue IV Solu-Medrol , IV antibiotics. - Placed on Pulmicort  and Brovana  nebs, scheduled Xopenex  and Atrovent  nebs,  Claritin , PPI, Flonase .  3.  Sinus tachycardia -Patient noted to have been on propafenone until 4 days prior to admission which was held by her cardiologist in anticipation of possible procedure for her stones. - Patient states cardiologist told her that she has to finish treatment of this acute infection and nephrolithiasis and follow-up in the outpatient setting in order to be placed back on propafenone. - Continue treatment as in problem #1. - May benefit from low-dose beta-blocker however will follow for now.  4.  History of rheumatoid arthritis -Patient noted to have recently changed from methotrexate to leflunomide  due to repeated pneumonia. - Leflunomide  recently held due to worsening renal function in the setting of renal stones. - Patient noted to also be on prednisone  10 mg daily as well as hydroxychloroquine . - Patient on stress dose IV Solu-Medrol . - Continue Plaquenil .  5.  Acute renal failure -Likely secondary to postrenal azotemia from left hydronephrosis with obstructing calculus. - Status post nephrostomy tube placement. - Urine output of 1.050 L over the past 24 hours. - Urostomy output of 650 cc over the past 24 hours. - Renal function improving/trending down. - Placed on gentle hydration.  6.  Well-controlled diabetes mellitus type 2 -Hemoglobin A1c 5.6. - CBG noted at 273 this morning. - Patient also on IV steroids. - Monitor CBGs with steroid taper. - SSI.   DVT prophylaxis: SCDs Code Status: Full Family Communication: Updated patient.  No family at bedside. Disposition: Likely home when clinically improved and cleared by urology  Status is: Inpatient Remains inpatient appropriate because: Severity of illness   Consultants:  Urology: Dr. Roseann 04/15/2024 Interventional radiology: Dr. Jennefer 04/15/2024  Procedures:  CT renal stone protocol 04/14/2024 Chest x-ray 04/16/2024 Left percutaneous nephrostomy tube placement by IR: Dr. Jennefer  04/15/2024 Cystoscopy/left retrograde pyelogram with intraoperative interpretation per urology: Dr. Roseann 04/15/2024.  Antimicrobials:  Anti-infectives (From admission, onward)    Start     Dose/Rate Route Frequency Ordered Stop   04/16/24 2200  cefTRIAXone  (ROCEPHIN ) 2 g in sodium chloride  0.9 % 100 mL IVPB        2 g 200 mL/hr over 30 Minutes Intravenous Every 24 hours 04/16/24 0937     04/15/24 2300  cefTRIAXone  (ROCEPHIN ) 1 g in sodium chloride  0.9 % 100 mL IVPB  Status:  Discontinued        1 g 200 mL/hr over 30 Minutes Intravenous Every 24 hours 04/15/24 0529 04/16/24 0937   04/15/24 1600  hydroxychloroquine  (PLAQUENIL ) tablet 400 mg        400 mg Oral Daily 04/15/24 1322     04/15/24 1200  ciprofloxacin  (CIPRO ) IVPB 400 mg        400 mg 200 mL/hr over 60 Minutes Intravenous  Once 04/15/24 1105 04/15/24 1550   04/14/24 2330  cefTRIAXone  (ROCEPHIN ) 1 g in sodium chloride  0.9 % 100 mL IVPB        1 g 200 mL/hr over 30 Minutes Intravenous  Once 04/14/24 2328 04/15/24 0011         Subjective: Patient sitting up on the side of the bed.  Still with complaints of left flank pain but improved since admission and after placement of nephrostomy tubes.  Denies any chest pain or shortness of breath.  No abdominal pain.  Asking whether she can have a shower.  Objective: Vitals:   04/16/24 0937 04/16/24 1200 04/16/24 1349 04/16/24 1801  BP: 137/82  (!) 149/114 (!) 142/95  Pulse: (!) 110  (!) 116 (!) 101  Resp:    16  Temp: 98.4 F (36.9 C)  98.4 F (36.9 C) 97.6 F (36.4 C)  TempSrc: Oral  Oral Oral  SpO2: 96%  96% 100%  Weight:  82.3 kg    Height:  4' 10 (1.473 m)      Intake/Output Summary (Last 24 hours) at 04/16/2024 1938 Last data filed at 04/16/2024 1800 Gross per 24 hour  Intake 830 ml  Output 700 ml  Net 130 ml   Filed Weights   04/16/24 1200  Weight: 82.3 kg    Examination:  General exam: Appears calm and comfortable  Respiratory system: Minimal  expiratory wheezing.  No crackles.  No rhonchi.  Fair air movement.  Cardiovascular system: Tachycardia.  No JVD, no murmurs rubs or gallops.  No pitting lower extremity edema.  Gastrointestinal system: Abdomen is nondistended, soft and nontender. No organomegaly or masses felt. Normal bowel sounds heard.  Left-sided nephrostomy tube in place with clear urine output noted. Central nervous system: Alert and oriented. No focal neurological deficits. Extremities: Symmetric 5 x 5 power. Skin: No rashes, lesions or ulcers Psychiatry: Judgement and insight appear normal. Mood & affect appropriate.     Data Reviewed: I have personally reviewed following labs and imaging studies  CBC: Recent Labs  Lab 04/14/24 2122 04/16/24 0419  WBC 8.0 12.1*  HGB 13.7 12.7  HCT 41.4 40.6  MCV 94.1 97.1  PLT 206 191    Basic Metabolic Panel: Recent Labs  Lab 04/14/24 2122 04/16/24 0419  NA 139 139  K 3.9 4.3  CL 100 108  CO2 30 23  GLUCOSE 111* 175*  BUN 13 22*  CREATININE 1.24* 1.13*  CALCIUM 9.7 8.9    GFR: Estimated Creatinine Clearance: 56.5 mL/min (A) (by C-G formula  based on SCr of 1.13 mg/dL (H)).  Liver Function Tests: Recent Labs  Lab 04/14/24 2122 04/16/24 0419  AST 15 15  ALT 16 13  ALKPHOS 70 61  BILITOT 0.6 0.6  PROT 6.5 6.0*  ALBUMIN 3.6 3.1*    CBG: Recent Labs  Lab 04/15/24 0600 04/15/24 1632 04/15/24 2126 04/16/24 1115 04/16/24 1644  GLUCAP 135* 206* 208* 273* 196*     Recent Results (from the past 240 hours)  Urine Culture     Status: Abnormal   Collection Time: 04/14/24 10:29 PM   Specimen: Urine, Clean Catch  Result Value Ref Range Status   Specimen Description   Final    URINE, CLEAN CATCH Performed at [facility], [address], [place], [place] [address]    Special Requests   Final    NONE Performed at [facility], [address], [place], [place] [address]    Culture (A)  Final    <10,000 COLONIES/mL INSIGNIFICANT GROWTH Performed at  [facility], [address], [place], [place] [address]    Report Status 04/16/2024 FINAL  Final  Blood culture (routine x 2)     Status: None (Preliminary result)   Collection Time: 04/15/24  7:08 AM   Specimen: BLOOD  Result Value Ref Range Status   Specimen Description   Final    BLOOD BLOOD LEFT ARM AEROBIC BOTTLE ONLY ANAEROBIC BOTTLE ONLY Performed at [facility], [address] [place], [place] [address]    Special Requests   Final    BOTTLES DRAWN AEROBIC AND ANAEROBIC Blood Culture adequate volume Performed at [facility], [address], [place], [place] [address]    Culture   Final    NO GROWTH 1 DAY Performed at [facility], [address] [place], [place] [address]    Report Status PENDING  Incomplete  Blood culture (routine x 2)     Status: None (Preliminary result)   Collection Time: 04/15/24  7:08 AM   Specimen: BLOOD  Result Value Ref Range Status   Specimen Description   Final    BLOOD BLOOD LEFT HAND AEROBIC BOTTLE ONLY ANAEROBIC BOTTLE ONLY Performed at [facility], [address] [place], [place] [address]    Special Requests   Final    BOTTLES DRAWN AEROBIC AND ANAEROBIC Blood Culture results may not be optimal due to an inadequate volume of blood received in culture bottles Performed at [facility], [address], [place], [place] [address]    Culture   Final    NO GROWTH 1 DAY Performed at [facility], [address] [place], [place] [address]    Report Status PENDING  Incomplete         Radiology Studies: DG CHEST PORT 1 VIEW Result Date: 04/16/2024 CLINICAL DATA:  Wheezing. EXAM: PORTABLE CHEST 1 VIEW COMPARISON:  12/14/2023. FINDINGS: Bilateral lung fields are clear. Bilateral costophrenic angles are clear. Normal cardio-mediastinal silhouette. No acute osseous abnormalities. The soft tissues are within normal limits. IMPRESSION: No active  disease. Electronically Signed   By: Ree Molt M.D.   On: 04/16/2024 10:12   IR NEPHROSTOMY PLACEMENT LEFT Result Date: 04/15/2024 INDICATION: 48 year old female obstructive ureteral nephrolithiasis status post failed attempted retrograde stent placement. EXAM: 1. ULTRASOUND GUIDANCE FOR PUNCTURE OF THE LEFT RENAL COLLECTING SYSTEM 2. LEFT PERCUTANEOUS NEPHROSTOMY TUBE PLACEMENT. COMPARISON:  None Available. MEDICATIONS: Ciprofloxacin  400 mg IV; The antibiotic was administered in an  appropriate time frame prior to skin puncture. 25 mg Benadryl , intravenous ANESTHESIA/SEDATION: Moderate (conscious) sedation was employed during this procedure. A total of Versed  2 mg and Fentanyl  100 mcg was administered intravenously. Moderate Sedation Time: 12 minutes. The patient's level of consciousness and vital signs were monitored continuously by radiology nursing throughout the procedure under my direct supervision. CONTRAST:  Ten mL Isovue 300 - administered into the renal collecting system FLUOROSCOPY TIME:  Nine mGy reference air kerma COMPLICATIONS: None immediate. PROCEDURE: The procedure, risks, benefits, and alternatives were explained to the patient. Questions regarding the procedure were encouraged and answered. The patient understands and consents to the procedure. A timeout was performed prior to the initiation of the procedure. The left flank region was prepped and draped in the usual sterile fashion and a sterile drape was applied covering the operative field. A sterile gown and sterile gloves were used for the procedure. Local anesthesia was provided with 1% Lidocaine  with epinephrine . Ultrasound was used to localize the left kidney. Under direct ultrasound guidance, a 20 gauge needle was advanced into the renal collecting system. An ultrasound image documentation was performed. Access within the collecting system was confirmed with the efflux of urine followed by limited contrast injection. Over a Nitrex  wire, the tract was dilated with an Accustick stent. Next, under intermittent fluoroscpic guidance and over a short Amplatz wire, the track was dilated ultimately allowing placement of a 10-French percutaneous nephrostomy catheter which was advanced to the level of the renal pelvis where the coil was formed and locked. Contrast was injected and several spot fluoroscopic images were obtained in various obliquities. The catheter was secured at the skin with a Prolene retention suture and stat lock device and connected to a gravity bag was placed. Dressings were applied. The patient tolerated procedure well without immediate postprocedural complication. FINDINGS: Ultrasound scanning demonstrates a moderate to severely dilated left collecting system. Under a combination of ultrasound and fluoroscopic guidance, a posterior inferior calix was targeted allowing placement of a 10-French percutaneous nephrostomy catheter with end coiled and locked within the renal pelvis. Contrast injection confirmed appropriate positioning. IMPRESSION: Successful ultrasound and fluoroscopic guided placement of a left sided 10 French PCN. Ester Sides, MD [facility] Electronically Signed   By: Ester Sides M.D.   On: 04/15/2024 16:32   DG C-Arm 1-60 Min-No Report Result Date: 04/15/2024 Fluoroscopy was utilized by the requesting physician.  No radiographic interpretation.   CT Renal Stone Study Result Date: 04/14/2024 CLINICAL DATA:  Abdominal/flank pain, stone suspected EXAM: CT ABDOMEN AND PELVIS WITHOUT CONTRAST TECHNIQUE: Multidetector CT imaging of the abdomen and pelvis was performed following the standard protocol without IV contrast. RADIATION DOSE REDUCTION: This exam was performed according to the departmental dose-optimization program which includes automated exposure control, adjustment of the mA and/or kV according to patient size and/or use of iterative  reconstruction technique. COMPARISON:  09/10/2021 FINDINGS: Of note, the lack of intravenous contrast limits evaluation of the solid organ parenchyma and vascularity. Lower chest: No focal airspace consolidation or pleural effusion. Hepatobiliary: No mass.Cholecystectomy. No intrahepatic or extrahepatic biliary ductal dilation. Pancreas: No mass or main ductal dilation. No peripancreatic inflammation or fluid collection. Spleen: Normal size. No mass. Adrenals/Urinary Tract: No adrenal masses. 2.5 cm right upper pole cyst. Moderate left-sided hydronephrosis due to an obstructive calculus in the proximal left ureter, measuring 9 x 11 x 14 mm. Punctate nonobstructive bilateral calyceal calculi also present. The urinary bladder is completely decompressed. Stomach/Bowel: The  stomach is decompressed without focal abnormality. No small bowel wall thickening or inflammation. No small bowel obstruction.Normal appendix. Descending and sigmoid colonic diverticulosis. No changes of acute diverticulitis. Vascular/Lymphatic: No aortic aneurysm. Diffuse aortoiliac atherosclerosis. No intraabdominal or pelvic lymphadenopathy. Reproductive: fluid density structure in the right adnexa measuring 4.3 cm (axial 63). No free pelvic fluid. Other: No pneumoperitoneum, ascites, or mesenteric inflammation. Musculoskeletal: No acute fracture or destructive lesion. Multilevel thoracolumbar osteophytosis. IMPRESSION: 1. Obstructive proximal left ureteral calculus, measuring 9 x 11 x 14 mm, causing moderate hydronephrosis. 2. Fluid density structure in the right adnexa, measuring 4.3 cm (axial 63), which smaller than on the prior study, possibly an ovarian cyst, paraovarian cyst, or chronic hydrosalpinx. 3. Total colonic diverticulosis. No changes of acute diverticulitis. Aortic Atherosclerosis (ICD10-I70.0). Electronically Signed   By: Rogelia Myers M.D.   On: 04/14/2024 22:10        Scheduled Meds:  arformoterol   15 mcg Nebulization  BID   budesonide  (PULMICORT ) nebulizer solution  0.5 mg Nebulization BID   Buprenorphine  HCl  150 mcg Oral BID   famotidine   40 mg Oral Daily   fluticasone   1 spray Each Nare BID   folic acid   1 mg Oral QODAY   hydroxychloroquine   400 mg Oral Daily   insulin  aspart  0-15 Units Subcutaneous TID WC   insulin  aspart  0-5 Units Subcutaneous QHS   ipratropium  0.5 mg Nebulization Q6H   levalbuterol   0.63 mg Nebulization Q6H   loratadine   10 mg Oral Daily   methylPREDNISolone  (SOLU-MEDROL ) injection  40 mg Intravenous Q12H   pantoprazole   40 mg Oral Q0600   pregabalin   200 mg Oral TID   sodium chloride  flush  5 mL Intracatheter Q8H   tamsulosin   0.4 mg Oral Daily   Continuous Infusions:  sodium chloride      cefTRIAXone  (ROCEPHIN )  IV       LOS: 1 day    Time spent: 40 minutes    Toribio Hummer, MD [facility]   To contact the attending provider between 7A-7P or the covering provider during after hours 7P-7A, please log into the web site [email or web address] and access using universal  password for that web site. If you do not have the password, please call the hospital operator.  04/16/2024, 7:38 PM

## 2024-04-16 NOTE — Progress Notes (Signed)
 Spoke with RN concerning BS (exp whz- dim) , limb swelling, Sp02 95% on RA. PT states she takes Lasix PRN at home and feels that she may benefit from a dose at this time.SABRA

## 2024-04-16 NOTE — Progress Notes (Signed)
 Urology Inpatient Progress Note  Subjective: Feels better today.  Left nephrostomy tube draining yellow urine. Left flank pain improved.  Some pain with tube. Urine cx:  <10K colonies  Anti-infectives: Anti-infectives (From admission, onward)    Start     Dose/Rate Route Frequency Ordered Stop   04/16/24 2200  cefTRIAXone  (ROCEPHIN ) 2 g in sodium chloride  0.9 % 100 mL IVPB        2 g 200 mL/hr over 30 Minutes Intravenous Every 24 hours 04/16/24 0937     04/15/24 2300  cefTRIAXone  (ROCEPHIN ) 1 g in sodium chloride  0.9 % 100 mL IVPB  Status:  Discontinued        1 g 200 mL/hr over 30 Minutes Intravenous Every 24 hours 04/15/24 0529 04/16/24 0937   04/15/24 1600  hydroxychloroquine  (PLAQUENIL ) tablet 400 mg        400 mg Oral Daily 04/15/24 1322     04/15/24 1200  ciprofloxacin  (CIPRO ) IVPB 400 mg        400 mg 200 mL/hr over 60 Minutes Intravenous  Once 04/15/24 1105 04/15/24 1550   04/14/24 2330  cefTRIAXone  (ROCEPHIN ) 1 g in sodium chloride  0.9 % 100 mL IVPB        1 g 200 mL/hr over 30 Minutes Intravenous  Once 04/14/24 2328 04/15/24 0011       Current Facility-Administered Medications  Medication Dose Route Frequency Provider Last Rate Last Admin   arformoterol  (BROVANA ) nebulizer solution 15 mcg  15 mcg Nebulization BID Sebastian Toribio GAILS, MD   15 mcg at 04/16/24 1323   budesonide  (PULMICORT ) nebulizer solution 0.5 mg  0.5 mg Nebulization BID Sebastian Toribio GAILS, MD   0.5 mg at 04/16/24 1322   Buprenorphine  HCl FILM 150 mcg  150 mcg Oral BID Sebastian Toribio GAILS, MD       cefTRIAXone  (ROCEPHIN ) 2 g in sodium chloride  0.9 % 100 mL IVPB  2 g Intravenous Q24H Sebastian Toribio GAILS, MD       famotidine  (PEPCID ) tablet 40 mg  40 mg Oral Daily Sebastian Toribio GAILS, MD   40 mg at 04/16/24 1210   fentaNYL  (SUBLIMAZE ) injection 25-50 mcg  25-50 mcg Intravenous Q2H PRN Cindy Garnette POUR, MD   25 mcg at 04/16/24 0526   fluticasone  (FLONASE ) 50 MCG/ACT nasal spray 1 spray  1 spray Each Nare BID  Sebastian Toribio GAILS, MD       folic acid  (FOLVITE ) tablet 1 mg  1 mg Oral QODAY Thompson, Daniel V, MD   1 mg at 04/16/24 1210   hydroxychloroquine  (PLAQUENIL ) tablet 400 mg  400 mg Oral Daily Cindy Garnette POUR, MD   400 mg at 04/16/24 9253   insulin  aspart (novoLOG ) injection 0-15 Units  0-15 Units Subcutaneous TID WC Cindy Garnette POUR, MD   8 Units at 04/16/24 1211   insulin  aspart (novoLOG ) injection 0-5 Units  0-5 Units Subcutaneous QHS Cindy Garnette POUR, MD   2 Units at 04/15/24 2240   ipratropium (ATROVENT ) nebulizer solution 0.5 mg  0.5 mg Nebulization Q6H Sebastian Toribio GAILS, MD   0.5 mg at 04/16/24 1322   levalbuterol  (XOPENEX ) nebulizer solution 0.63 mg  0.63 mg Nebulization Q6H Neesa Knapik, Adine PARAS., MD   0.63 mg at 04/16/24 1322   levalbuterol  (XOPENEX ) nebulizer solution 0.63 mg  0.63 mg Nebulization Q6H PRN Roseann Adine PARAS., MD       loratadine  (CLARITIN ) tablet 10 mg  10 mg Oral Daily Sebastian Toribio GAILS, MD   10 mg at 04/16/24 1210  methylPREDNISolone  sodium succinate  (SOLU-MEDROL ) 40 mg/mL injection 40 mg  40 mg Intravenous Q12H Roseann Adine PARAS., MD   40 mg at 04/16/24 0744   Oral care mouth rinse  15 mL Mouth Rinse PRN Cindy Garnette POUR, MD       oxyCODONE -acetaminophen  (PERCOCET/ROXICET) 5-325 MG per tablet 1 tablet  1 tablet Oral TID PRN Cindy Garnette POUR, MD   1 tablet at 04/16/24 1415   And   oxyCODONE  (Oxy IR/ROXICODONE ) immediate release tablet 5 mg  5 mg Oral TID PRN Cindy Garnette POUR, MD   5 mg at 04/16/24 1415   pantoprazole  (PROTONIX ) EC tablet 40 mg  40 mg Oral Q0600 Sebastian Toribio GAILS, MD   40 mg at 04/16/24 1210   pregabalin  (LYRICA ) capsule 200 mg  200 mg Oral TID Cindy Garnette POUR, MD   200 mg at 04/16/24 1415   sodium chloride  flush (NS) 0.9 % injection 5 mL  5 mL Intracatheter Q8H Suttle, Ester PARAS, MD   5 mL at 04/16/24 1212   tamsulosin  (FLOMAX ) capsule 0.4 mg  0.4 mg Oral Daily Sebastian Toribio GAILS, MD   0.4 mg at 04/16/24 1210     Objective: Vital signs in last 24  hours: Temp:  [97.9 F (36.6 C)-98.4 F (36.9 C)] 98.4 F (36.9 C) (07/30 1349) Pulse Rate:  [96-116] 116 (07/30 1349) Resp:  [16-20] 20 (07/30 0606) BP: (107-151)/(67-114) 149/114 (07/30 1349) SpO2:  [95 %-97 %] 96 % (07/30 1349) Weight:  [82.3 kg] 82.3 kg (07/30 1200)  Intake/Output from previous day: 07/29 0701 - 07/30 0700 In: 555 [P.O.:250; I.V.:5; IV Piggyback:300] Out: 1050 [Urine:1050] Intake/Output this shift: Total I/O In: 240 [P.O.:240] Out: 150 [Urine:150]  GENERAL APPEARANCE:  Well appearing, well developed, well nourished, NAD NECK:  Supple without lymphadenopathy or thyromegaly ABDOMEN:  Soft, non-tender, no masses EXTREMITIES:  Moves all extremities well, without clubbing, cyanosis, or edema NEUROLOGIC:  Alert and oriented x 3, normal gait, CN II-XII grossly intact MENTAL STATUS:  appropriate BACK:  Non-tender to palpation, No CVAT; left nephrostomy tube draining yellow urine SKIN:  Warm, dry, and intact   Lab Results:  Recent Labs    04/14/24 2122 04/16/24 0419  WBC 8.0 12.1*  HGB 13.7 12.7  HCT 41.4 40.6  PLT 206 191   BMET Recent Labs    04/14/24 2122 04/16/24 0419  NA 139 139  K 3.9 4.3  CL 100 108  CO2 30 23  GLUCOSE 111* 175*  BUN 13 22*  CREATININE 1.24* 1.13*  CALCIUM 9.7 8.9   PT/INR Recent Labs    04/15/24 0835  LABPROT 13.1  INR 0.9   ABG No results for input(s): PHART, HCO3 in the last 72 hours.  Invalid input(s): PCO2, PO2  Studies/Results: DG CHEST PORT 1 VIEW Result Date: 04/16/2024 CLINICAL DATA:  Wheezing. EXAM: PORTABLE CHEST 1 VIEW COMPARISON:  12/14/2023. FINDINGS: Bilateral lung fields are clear. Bilateral costophrenic angles are clear. Normal cardio-mediastinal silhouette. No acute osseous abnormalities. The soft tissues are within normal limits. IMPRESSION: No active disease. Electronically Signed   By: Ree Molt M.D.   On: 04/16/2024 10:12   IR NEPHROSTOMY PLACEMENT LEFT Result Date:  04/15/2024 INDICATION: 48 year old female obstructive ureteral nephrolithiasis status post failed attempted retrograde stent placement. EXAM: 1. ULTRASOUND GUIDANCE FOR PUNCTURE OF THE LEFT RENAL COLLECTING SYSTEM 2. LEFT PERCUTANEOUS NEPHROSTOMY TUBE PLACEMENT. COMPARISON:  None Available. MEDICATIONS: Ciprofloxacin  400 mg IV; The antibiotic was administered in an appropriate time frame prior to skin puncture. 25  mg Benadryl , intravenous ANESTHESIA/SEDATION: Moderate (conscious) sedation was employed during this procedure. A total of Versed  2 mg and Fentanyl  100 mcg was administered intravenously. Moderate Sedation Time: 12 minutes. The patient's level of consciousness and vital signs were monitored continuously by radiology nursing throughout the procedure under my direct supervision. CONTRAST:  Ten mL Isovue 300 - administered into the renal collecting system FLUOROSCOPY TIME:  Nine mGy reference air kerma COMPLICATIONS: None immediate. PROCEDURE: The procedure, risks, benefits, and alternatives were explained to the patient. Questions regarding the procedure were encouraged and answered. The patient understands and consents to the procedure. A timeout was performed prior to the initiation of the procedure. The left flank region was prepped and draped in the usual sterile fashion and a sterile drape was applied covering the operative field. A sterile gown and sterile gloves were used for the procedure. Local anesthesia was provided with 1% Lidocaine  with epinephrine . Ultrasound was used to localize the left kidney. Under direct ultrasound guidance, a 20 gauge needle was advanced into the renal collecting system. An ultrasound image documentation was performed. Access within the collecting system was confirmed with the efflux of urine followed by limited contrast injection. Over a Nitrex wire, the tract was dilated with an Accustick stent. Next, under intermittent fluoroscpic guidance and over a short Amplatz  wire, the track was dilated ultimately allowing placement of a 10-French percutaneous nephrostomy catheter which was advanced to the level of the renal pelvis where the coil was formed and locked. Contrast was injected and several spot fluoroscopic images were obtained in various obliquities. The catheter was secured at the skin with a Prolene retention suture and stat lock device and connected to a gravity bag was placed. Dressings were applied. The patient tolerated procedure well without immediate postprocedural complication. FINDINGS: Ultrasound scanning demonstrates a moderate to severely dilated left collecting system. Under a combination of ultrasound and fluoroscopic guidance, a posterior inferior calix was targeted allowing placement of a 10-French percutaneous nephrostomy catheter with end coiled and locked within the renal pelvis. Contrast injection confirmed appropriate positioning. IMPRESSION: Successful ultrasound and fluoroscopic guided placement of a left sided 10 French PCN. Ester Sides, MD [facility] Electronically Signed   By: Ester Sides M.D.   On: 04/15/2024 16:32   DG C-Arm 1-60 Min-No Report Result Date: 04/15/2024 Fluoroscopy was utilized by the requesting physician.  No radiographic interpretation.   CT Renal Stone Study Result Date: 04/14/2024 CLINICAL DATA:  Abdominal/flank pain, stone suspected EXAM: CT ABDOMEN AND PELVIS WITHOUT CONTRAST TECHNIQUE: Multidetector CT imaging of the abdomen and pelvis was performed following the standard protocol without IV contrast. RADIATION DOSE REDUCTION: This exam was performed according to the departmental dose-optimization program which includes automated exposure control, adjustment of the mA and/or kV according to patient size and/or use of iterative reconstruction technique. COMPARISON:  09/10/2021 FINDINGS: Of note, the lack of intravenous contrast limits evaluation of the solid  organ parenchyma and vascularity. Lower chest: No focal airspace consolidation or pleural effusion. Hepatobiliary: No mass.Cholecystectomy. No intrahepatic or extrahepatic biliary ductal dilation. Pancreas: No mass or main ductal dilation. No peripancreatic inflammation or fluid collection. Spleen: Normal size. No mass. Adrenals/Urinary Tract: No adrenal masses. 2.5 cm right upper pole cyst. Moderate left-sided hydronephrosis due to an obstructive calculus in the proximal left ureter, measuring 9 x 11 x 14 mm. Punctate nonobstructive bilateral calyceal calculi also present. The urinary bladder is completely decompressed. Stomach/Bowel: The stomach is decompressed without focal abnormality. No small  bowel wall thickening or inflammation. No small bowel obstruction.Normal appendix. Descending and sigmoid colonic diverticulosis. No changes of acute diverticulitis. Vascular/Lymphatic: No aortic aneurysm. Diffuse aortoiliac atherosclerosis. No intraabdominal or pelvic lymphadenopathy. Reproductive: fluid density structure in the right adnexa measuring 4.3 cm (axial 63). No free pelvic fluid. Other: No pneumoperitoneum, ascites, or mesenteric inflammation. Musculoskeletal: No acute fracture or destructive lesion. Multilevel thoracolumbar osteophytosis. IMPRESSION: 1. Obstructive proximal left ureteral calculus, measuring 9 x 11 x 14 mm, causing moderate hydronephrosis. 2. Fluid density structure in the right adnexa, measuring 4.3 cm (axial 63), which smaller than on the prior study, possibly an ovarian cyst, paraovarian cyst, or chronic hydrosalpinx. 3. Total colonic diverticulosis. No changes of acute diverticulitis. Aortic Atherosclerosis (ICD10-I70.0). Electronically Signed   By: Rogelia Myers M.D.   On: 04/14/2024 22:10     Assessment & Plan:  Left ureteral calculus - POD 1 s/p attempted left ureteral stent placement - Left nephrostomy tube placed 04/15/24 - will need outpatient follow-up to arrange for  definitive management of left ureteral calculus - OK for discharge from urology standpoint     Adine Manly, MD 04/16/2024

## 2024-04-16 NOTE — Progress Notes (Signed)
 Referring Provider(s):   Supervising Physician: Karalee Beat  Patient Status:  [facility] - In-pt  Chief Complaint:  Obstructing left renal calculus s/p image guided left percutaneous nephrostomy tube placement 04/15/24 Dr. Jennefer   Subjective:  Pt states she is feeling well today, better than yesterday.  Pt states some pain has subsided since tube placement.  Pt endorses discomfort at tube site.  All pt questions answered.   Allergies: Nsaids, Aspirin, Flagyl  [metronidazole ], and Macrobid [nitrofurantoin macrocrystal]  Medications: Prior to Admission medications   Medication Sig Start Date End Date Taking? Authorizing Provider  acetaminophen  (TYLENOL ) 500 MG tablet Take 1,000 mg by mouth daily as needed for mild pain.    Yes [provider]  albuterol  (PROVENTIL ) (2.5 MG/3ML) 0.083% nebulizer solution Take 2.5 mg by nebulization every 6 (six) hours as needed for wheezing or shortness of breath.   Yes [provider]  albuterol  (VENTOLIN  HFA) 108 (90 Base) MCG/ACT inhaler Inhale 1-2 puffs into the lungs every 6 (six) hours as needed for wheezing or shortness of breath. 07/31/21  Yes Stuart Vernell Norris, PA-C  alendronate (FOSAMAX) 70 MG tablet Take 70 mg by mouth once a week. 09/15/22  Yes [provider]  BELBUCA  150 MCG FILM Take 150 mcg by mouth 2 (two) times daily. 12/07/23  Yes [provider]  budeson-glycopyrrolate -formoterol (BREZTRI  AEROSPHERE) 160-9-4.8 MCG/ACT AERO Inhale 2 puffs into the lungs in the morning and at bedtime. 12/11/23  Yes Young, Reggy D, MD  cetirizine  (ZYRTEC  ALLERGY) 10 MG tablet Take 1 tablet (10 mg total) by mouth daily. 07/31/21  Yes Stuart Vernell Norris, PA-C  cyclobenzaprine  (FLEXERIL ) 10 MG tablet Take 10 mg by mouth at bedtime. 07/22/21  Yes [provider]  famotidine  (PEPCID ) 40 MG tablet Take 40 mg by mouth daily. 11/28/23  Yes [provider]  fluticasone  (FLONASE ) 50 MCG/ACT nasal spray  Place 1 spray into both nostrils 2 (two) times daily. Patient taking differently: Place 1 spray into both nostrils as needed for allergies. 07/31/21  Yes Stuart Vernell Norris, PA-C  folic acid  (FOLVITE ) 1 MG tablet Take 1 tablet by mouth every other day. 07/09/19  Yes [provider]  hydroxychloroquine  (PLAQUENIL ) 200 MG tablet Take 400 mg by mouth daily.   Yes [provider]  Ipratropium-Albuterol  (COMBIVENT RESPIMAT) 20-100 MCG/ACT AERS respimat Inhale 1 puff into the lungs every 6 (six) hours as needed for wheezing or shortness of breath. 10/23/18  Yes [provider]  leflunomide  (ARAVA ) 10 MG tablet Take 10 mg by mouth daily. 04/10/24  Yes [provider]  Multiple Vitamins-Minerals ([employer] ADULT MULTIVITAMIN) CHEW Chew 2 tablets by mouth daily.   Yes [provider]  OHTUVAYRE  3 MG/2.5ML SUSP  04/02/24  Yes [provider]  oxyCODONE -acetaminophen  (PERCOCET) 10-325 MG tablet Take 1 tablet by mouth 3 (three) times daily as needed. 04/04/24  Yes [provider]  OZEMPIC, 2 MG/DOSE, 8 MG/3ML SOPN Inject 2 mg into the skin once a week. 11/13/23  Yes [provider]  predniSONE  (DELTASONE ) 5 MG tablet Take 5 mg by mouth daily with breakfast.   Yes [provider]  pregabalin  (LYRICA ) 200 MG capsule Take 200 mg by mouth 3 (three) times daily. 03/22/24  Yes [provider]  simvastatin (ZOCOR) 20 MG tablet Take 20 mg by mouth every evening. 02/06/24  Yes [provider]  tamsulosin  (FLOMAX ) 0.4 MG CAPS capsule Take 0.4 mg by mouth daily. 03/22/24  Yes [provider]  atenolol (TENORMIN)  50 MG tablet Take 50 mg by mouth daily. Patient not taking: Reported on 04/15/2024 11/05/23   [provider]  BELBUCA  75 MCG FILM Take 75 mcg by mouth 2 (two) times daily. Patient not taking: Reported on 04/15/2024 11/20/23   [provider]  Cholecalciferol  (VITAMIN D ) 2000 units CAPS Take 1 capsule by  mouth daily. Patient not taking: Reported on 04/15/2024    [provider]  Cholecalciferol  5000 units capsule Take 1 capsule (5,000 Units total) by mouth daily. Patient not taking: Reported on 04/15/2024 11/24/15   Signa Nest A, NP  glipiZIDE (GLUCOTROL) 10 MG tablet Take 10 mg by mouth daily. Patient not taking: Reported on 04/15/2024 11/05/23   [provider]  methotrexate (50 MG/ML) 1 g injection Inject into the vein once a week. Patient not taking: Reported on 04/15/2024    [provider]  methylPREDNISolone  (MEDROL  DOSEPAK) 4 MG TBPK tablet Take as directed Patient not taking: Reported on 04/15/2024 01/14/24   Sikora, Rebecca, DPM  oxyCODONE  (OXY IR/ROXICODONE ) 5 MG immediate release tablet Take 7.5 mg by mouth every 6 (six) hours as needed. pain Patient not taking: Reported on 04/15/2024    [provider]  predniSONE  (STERAPRED UNI-PAK 21 TAB) 10 MG (21) TBPK tablet Take per package instructions Patient not taking: Reported on 04/15/2024 12/14/23   Chandra Harlene LABOR, NP  promethazine -dextromethorphan (PROMETHAZINE -DM) 6.25-15 MG/5ML syrup Take 5 mLs by mouth 4 (four) times daily as needed. Patient not taking: Reported on 12/11/2023 11/28/23   Stuart Vernell Norris, PA-C  propafenone (RYTHMOL SR) 225 MG 12 hr capsule Take 225 mg by mouth every 12 (twelve) hours. Patient not taking: Reported on 04/15/2024 03/17/24   [provider]  propafenone (RYTHMOL) 150 MG tablet Take 150 mg by mouth 3 (three) times daily. Patient not taking: Reported on 04/15/2024 03/17/24   [provider]  Cimetidine (HEARTBURN RELIEF PO) Take 1 tablet by mouth daily as needed (heartburn).  05/31/20  [provider]     Vital Signs: BP 137/82 (BP Location: Left Arm)   Pulse (!) 110   Temp 98.4 F (36.9 C) (Oral)   Resp 20   LMP 01/22/2011   SpO2 96%   Physical Exam Constitutional:      Appearance: Normal appearance.  Skin:    Comments: Left PCN  site clean, dry, without signs of infection  Suture intact  Output 50 mL urine in gravity bag   Neurological:     Mental Status: She is alert and oriented to person, place, and time.  Psychiatric:        Behavior: Behavior normal.      Labs:  CBC: Recent Labs    04/14/24 2122 04/16/24 0419  WBC 8.0 12.1*  HGB 13.7 12.7  HCT 41.4 40.6  PLT 206 191    COAGS: Recent Labs    04/15/24 0835  INR 0.9    BMP: Recent Labs    04/14/24 2122 04/16/24 0419  NA 139 139  K 3.9 4.3  CL 100 108  CO2 30 23  GLUCOSE 111* 175*  BUN 13 22*  CALCIUM 9.7 8.9  CREATININE 1.24* 1.13*  GFRNONAA 54* >60    LIVER FUNCTION TESTS: Recent Labs    04/14/24 2122 04/16/24 0419  BILITOT 0.6 0.6  AST 15 15  ALT 16 13  ALKPHOS 70 61  PROT 6.5 6.0*  ALBUMIN 3.6 3.1*    Assessment and Plan:  Obstructing left renal calculus s/p image guided left percutaneous  nephrostomy tube placement 04/15/24 Dr. Jennefer Ribas Location: Left PCN Size: Fr size: 10 Fr Date of placement: 04/15/24  Currently to: Drain collection device: gravity 24 hour output:  Output by Drain (mL) 04/14/24 0701 - 04/14/24 1900 04/14/24 1901 - 04/15/24 0700 04/15/24 0701 - 04/15/24 1900 04/15/24 1901 - 04/16/24 0700 04/16/24 0701 - 04/16/24 1141  Requested LDAs do not have output data documented.    Interval imaging/drain manipulation:  None   Current examination: Flushes/aspirates easily.  Insertion site unremarkable. Suture and stat lock in place. Dressed appropriately.   Plan: Continue TID flushes with 5 cc NS. Record output Q shift. Dressing changes QD or PRN if soiled.  Call IR APP or on call IR MD if difficulty flushing or sudden change in drain output.  Repeat imaging/possible drain injection once output < 10 mL/QD (excluding flush material). Consideration for drain removal if output is < 10 mL/QD (excluding flush material), pending discussion with the providing surgical service.  Discharge  planning: Please contact IR APP or on call IR MD prior to patient d/c to ensure appropriate follow up plans are in place. Typically patient will follow up with IR clinic 10-14 days post d/c for repeat imaging/possible drain injection. IR scheduler will contact patient with date/time of appointment. Patient will need to flush drain QD with 5 cc NS, record output QD, dressing changes every 2-3 days or earlier if soiled.   IR will continue to follow - please call with questions or concerns.  Electronically Signed: Lavanda JAYSON Jurist, PA-C 04/16/2024, 11:41 AM    I spent a total of 15 Minutes at the the patient's bedside AND on the patient's hospital floor or unit, greater than 50% of which was counseling/coordinating care for left percutaneous nephrostomy tube placement.

## 2024-04-17 ENCOUNTER — Other Ambulatory Visit (HOSPITAL_BASED_OUTPATIENT_CLINIC_OR_DEPARTMENT_OTHER): Payer: Self-pay

## 2024-04-17 ENCOUNTER — Other Ambulatory Visit: Payer: Self-pay

## 2024-04-17 ENCOUNTER — Other Ambulatory Visit (HOSPITAL_COMMUNITY): Payer: Self-pay

## 2024-04-17 DIAGNOSIS — Q6211 Congenital occlusion of ureteropelvic junction: Secondary | ICD-10-CM | POA: Diagnosis not present

## 2024-04-17 DIAGNOSIS — N201 Calculus of ureter: Secondary | ICD-10-CM | POA: Diagnosis not present

## 2024-04-17 DIAGNOSIS — N3000 Acute cystitis without hematuria: Secondary | ICD-10-CM | POA: Diagnosis not present

## 2024-04-17 DIAGNOSIS — E119 Type 2 diabetes mellitus without complications: Secondary | ICD-10-CM

## 2024-04-17 DIAGNOSIS — R000 Tachycardia, unspecified: Secondary | ICD-10-CM | POA: Diagnosis not present

## 2024-04-17 LAB — GLUCOSE, CAPILLARY
Glucose-Capillary: 205 mg/dL — ABNORMAL HIGH (ref 70–99)
Glucose-Capillary: 204 mg/dL — ABNORMAL HIGH (ref 70–99)

## 2024-04-17 LAB — CBC WITH DIFFERENTIAL/PLATELET
Abs Immature Granulocytes: 0.12 K/uL — ABNORMAL HIGH (ref 0.00–0.07)
Basophils Absolute: 0 K/uL (ref 0.0–0.1)
Basophils Relative: 0 %
Eosinophils Absolute: 0 K/uL (ref 0.0–0.5)
Eosinophils Relative: 0 %
HCT: 39 % (ref 36.0–46.0)
Hemoglobin: 12.5 g/dL (ref 12.0–15.0)
Immature Granulocytes: 1 %
Lymphocytes Relative: 6 %
Lymphs Abs: 0.7 K/uL (ref 0.7–4.0)
MCH: 31 pg (ref 26.0–34.0)
MCHC: 32.1 g/dL (ref 30.0–36.0)
MCV: 96.8 fL (ref 80.0–100.0)
Monocytes Absolute: 0.4 K/uL (ref 0.1–1.0)
Monocytes Relative: 3 %
Neutro Abs: 12.1 K/uL — ABNORMAL HIGH (ref 1.7–7.7)
Neutrophils Relative %: 90 %
Platelets: 220 K/uL (ref 150–400)
RBC: 4.03 MIL/uL (ref 3.87–5.11)
RDW: 13.2 % (ref 11.5–15.5)
WBC: 13.4 K/uL — ABNORMAL HIGH (ref 4.0–10.5)
nRBC: 0 % (ref 0.0–0.2)

## 2024-04-17 LAB — BASIC METABOLIC PANEL WITH GFR
Anion gap: 8 (ref 5–15)
BUN: 22 mg/dL — ABNORMAL HIGH (ref 6–20)
CO2: 23 mmol/L (ref 22–32)
Calcium: 8.6 mg/dL — ABNORMAL LOW (ref 8.9–10.3)
Chloride: 109 mmol/L (ref 98–111)
Creatinine, Ser: 0.9 mg/dL (ref 0.44–1.00)
GFR, Estimated: >60 mL/min (ref >60)
Glucose, Bld: 264 mg/dL — ABNORMAL HIGH (ref 70–99)
Potassium: 4 mmol/L (ref 3.5–5.1)
Sodium: 140 mmol/L (ref 135–145)

## 2024-04-17 MED ORDER — LEFLUNOMIDE 10 MG PO TABS: 10.0000 mg | ORAL_TABLET | Freq: Every day | ORAL | Status: DC

## 2024-04-17 MED ORDER — CEFADROXIL 500 MG PO CAPS
1000.0000 mg | ORAL_CAPSULE | Freq: Two times a day (BID) | ORAL | 0 refills | Status: AC
  Filled 2024-04-17: qty 20, 5d supply, fill #0

## 2024-04-17 MED ORDER — LEVALBUTEROL HCL 0.63 MG/3ML IN NEBU
INHALATION_SOLUTION | RESPIRATORY_TRACT | 0 refills | Status: DC
  Filled 2024-04-17: qty 75, 14d supply, fill #0

## 2024-04-17 MED ORDER — CEFADROXIL 500 MG PO CAPS
1000.0000 mg | ORAL_CAPSULE | Freq: Two times a day (BID) | ORAL | Status: DC
  Administered 2024-04-17: 1000 mg via ORAL
  Filled 2024-04-17: qty 2

## 2024-04-17 MED ORDER — PREDNISONE 20 MG PO TABS: 40.0000 mg | ORAL_TABLET | Freq: Every day | ORAL | Status: DC

## 2024-04-17 MED ORDER — PREDNISONE 20 MG PO TABS
ORAL_TABLET | ORAL | 0 refills | Status: AC
  Filled 2024-04-17: qty 9, 6d supply, fill #0

## 2024-04-17 NOTE — TOC CM/SW Note (Signed)
 Transition of Care [facility]) - Inpatient Brief Assessment   Patient Details  Name: Cassandra Lucas MRN: [id number] Date of Birth: 1976/07/19  [facility] CM/SW Contact:    Tawni CHRISTELLA Eva, LCSW Phone Number: 04/17/2024, 9:38 AM    Transition of Care Asessment: Insurance and Status: Insurance coverage has been reviewed Patient has primary care physician: Yes Home environment has been reviewed: home with self Prior level of function:: indepednent Prior/Current Home Services: No current home services Social Drivers of Health Review: SDOH reviewed no interventions necessary Readmission risk has been reviewed: Yes Transition of care needs: no transition of care needs at this time

## 2024-04-17 NOTE — Discharge Summary (Signed)
 Physician Discharge Summary  Cassandra Lucas:[id number] DOB: 1975-12-11 DOA: 04/14/2024  PCP: [facility]  Admit date: 04/14/2024 Discharge date: 04/17/2024  Time spent: 60 minutes  Recommendations for Outpatient Follow-up:  Follow-up with [facility], urology in 2 weeks for definite stone management. Follow-up with [facility] in 2 weeks.  On follow-up patient will need a basic metabolic profile done to follow-up on electrolytes and renal function.  Patient's diabetes will need to be followed up upon.   Discharge Diagnoses:  Principal Problem:   Ureteric stone Active Problems:   Rheumatoid arthritis involving multiple sites with positive rheumatoid factor (HCC)   Ureterolithiasis   Lower urinary tract infectious disease   COPD with acute exacerbation (HCC)   Tachycardia   Hydronephrosis   Acute cystitis without hematuria   Type 2 diabetes mellitus without complication, without long-term current use of insulin  (HCC)   Discharge Condition: Stable and improved.  Diet recommendation: Carb modified diet  Filed Weights   04/16/24 1200  Weight: 82.3 kg    History of present illness:  HPI per [facility] is a 48 y.o. female with history of rheumatoid arthritis, COPD with ongoing tobacco abuse, tachycardia presents to the ER with complaints of worsening left flank pain.  Patient has been having ongoing pain in the left flank for the last 2 to 3 weeks.  Has had prior history of multiple nephrolithiasis.  Due to acute worsening of pain patient presents to the ER.  Denies any nausea or vomiting.  Has been having increasing wheezing last few days.  Patient's rheumatologist advised patient to hold off leflunomide  due to recent diagnosis of kidney stones until it gets corrected.  Also patient's cardiologist at [facility] advised patient to hold off patient's propafenone which patient takes it for palpitations not sure what kind  of arrhythmia patient was taking it for.   ED Course: In the ER CT scan shows left-sided hydronephrosis with left ureteral stone and UA is concerning for UTI.  [facility] urologist was consulted patient was taken to the OR.  Labs show WBC of 8 creatinine 1.2.  On exam patient is diffusely wheezing.  Hospital Course:  #1 left ureteral stone with left hydronephrosis/UTI/ - Patient presented with left flank pain, in the setting of nephrolithiasis, urinalysis concerning for UTI. - Patient underwent failed attempt for ureteral stent placement prior urology and as such IR consulted and patient underwent percutaneous left nephrostomy tube placement per IR on 04/15/2024. - Blood cultures with no growth to date. - Urine cultures with insignificant growth. - Patient maintained on IV Rocephin  during the hospitalization and transition to Duricef and patient be discharged on 5 days of Duricef to complete a 7-day course of antibiotic treatment.   - Outpatient follow-up with urology and IR.    2.  Acute COPD with exacerbation -Patient noted on presentation to have some wheezing on examination. - Patient placed on IV Solu-Medrol , once IV antibiotics, placed on Pulmicort  and Brovana  nebs as well as scheduled Xopenex  and Atrovent  nebs, Claritin , PPI and Flonase .   - Patient improved clinically and be discharged on a steroid taper.  -Outpatient follow-up with PCP.    3.  Sinus tachycardia -Patient noted to have been on propafenone until 4 days prior to admission which was held by her cardiologist in anticipation of possible procedure for her stones. - Patient stated cardiologist told her that she has to finish treatment of this acute infection and nephrolithiasis and follow-up in  the outpatient setting in order to be placed back on propafenone. -Outpatient follow-up with PCP/primary cardiologist.   4.  History of rheumatoid arthritis -Patient noted to have recently changed from methotrexate to leflunomide  due  to repeated pneumonia. - Leflunomide  recently held due to worsening renal function in the setting of renal stones. - Patient noted to also be on prednisone  5 mg daily as well as hydroxychloroquine . - Patient was placed on IV Solu-Medrol  during the hospitalization due to acute COPD exacerbation with discharge on a steroid taper back to home dose of prednisone .   - Outpatient follow-up.     5.  Acute renal failure -Likely secondary to postrenal azotemia from left hydronephrosis with obstructing calculus. - Status post nephrostomy tube placement. - Patient with good urine output noted and renal function improved daily with gentle hydration. - Renal function improved/that by day of discharge creatinine was down to 0.90.  - Outpatient follow-up with PCP and urology.    6.  Well-controlled diabetes mellitus type 2 -Hemoglobin A1c 5.6. -Patient was on IV steroids during the hospitalization for acute COPD exacerbation with increased CBGs and patient maintained on sliding scale insulin . -Outpatient follow-up.  Procedures: CT renal stone protocol 04/14/2024 Chest x-ray 04/16/2024 Left percutaneous nephrostomy tube placement by IR: Dr. Jennefer 04/15/2024 Cystoscopy/left retrograde pyelogram with intraoperative interpretation per urology: [facility] 04/15/2024.  Consultations: Urology: [facility] 04/15/2024 Interventional radiology: Dr. Jennefer 04/15/2024  Discharge Exam: Vitals:   04/17/24 0316 04/17/24 0847  BP: (!) 142/96   Pulse: (!) 109   Resp: 16   Temp: 98 F (36.7 C)   SpO2: 94% 97%    General: NAD Cardiovascular: RRR no murmurs rubs or gallops.  No JVD.  No pitting lower extremity edema. Respiratory: Clear to auscultation bilaterally.  No wheezes, no crackles, no rhonchi.  Fair air movement.  Speaking in full sentences.  Discharge Instructions   Discharge Instructions     Diet Carb Modified   Complete by: As directed    Increase activity slowly   Complete by: As  directed    No wound care   Complete by: As directed       Allergies as of 04/17/2024       Reactions   Nsaids Shortness Of Breath   Aspirin Other (See Comments)   Unknown childhood reaction   Flagyl  [metronidazole ]    Macrobid [nitrofurantoin Macrocrystal] Nausea And Vomiting        Medication List     PAUSE taking these medications    predniSONE  5 MG tablet Wait to take this until: April 24, 2024 Commonly known as: DELTASONE  Take 5 mg by mouth daily with breakfast. You also have another medication with the same name that you may need to continue taking.       STOP taking these medications    atenolol 50 MG tablet Commonly known as: TENORMIN   Cholecalciferol  125 MCG (5000 UT) capsule   methotrexate 1 g injection Commonly known as: 50 mg/ml   methylPREDNISolone  4 MG Tbpk tablet Commonly known as: MEDROL  DOSEPAK   oxyCODONE  5 MG immediate release tablet Commonly known as: Oxy IR/ROXICODONE    predniSONE  10 MG (21) Tbpk tablet Commonly known as: STERAPRED UNI-PAK 21 TAB Replaced by: predniSONE  20 MG tablet You also have another medication with the same name that you need to continue taking as instructed.   Vitamin D  50 MCG (2000 UT) Caps       TAKE these medications    acetaminophen  500 MG tablet Commonly  known as: TYLENOL  Take 1,000 mg by mouth daily as needed for mild pain.   albuterol  (2.5 MG/3ML) 0.083% nebulizer solution Commonly known as: PROVENTIL  Take 2.5 mg by nebulization every 6 (six) hours as needed for wheezing or shortness of breath.   albuterol  108 (90 Base) MCG/ACT inhaler Commonly known as: VENTOLIN  HFA Inhale 1-2 puffs into the lungs every 6 (six) hours as needed for wheezing or shortness of breath.   alendronate 70 MG tablet Commonly known as: FOSAMAX Take 70 mg by mouth once a week.   Belbuca  150 MCG Film Generic drug: Buprenorphine  HCl Take 150 mcg by mouth 2 (two) times daily. What changed: Another medication with the  same name was removed. Continue taking this medication, and follow the directions you see here.   Breztri  Aerosphere 160-9-4.8 MCG/ACT Aero inhaler Generic drug: budesonide -glycopyrrolate -formoterol Inhale 2 puffs into the lungs in the morning and at bedtime.   cefadroxil  500 MG capsule Commonly known as: DURICEF Take 2 capsules (1,000 mg total) by mouth 2 (two) times daily for 5 days.   cetirizine  10 MG tablet Commonly known as: ZyrTEC  Allergy Take 1 tablet (10 mg total) by mouth daily.   Combivent Respimat 20-100 MCG/ACT Aers respimat Generic drug: Ipratropium-Albuterol  Inhale 1 puff into the lungs every 6 (six) hours as needed for wheezing or shortness of breath.   [employer] Adult Multivitamin Chew Chew 2 tablets by mouth daily.   cyclobenzaprine  10 MG tablet Commonly known as: FLEXERIL  Take 10 mg by mouth at bedtime.   famotidine  40 MG tablet Commonly known as: PEPCID  Take 40 mg by mouth daily.   fluticasone  50 MCG/ACT nasal spray Commonly known as: FLONASE  Place 1 spray into both nostrils 2 (two) times daily. What changed:  when to take this reasons to take this   folic acid  1 MG tablet Commonly known as: FOLVITE  Take 1 tablet by mouth every other day.   glipiZIDE 10 MG tablet Commonly known as: GLUCOTROL Take 10 mg by mouth daily.   hydroxychloroquine  200 MG tablet Commonly known as: PLAQUENIL  Take 400 mg by mouth daily.   leflunomide  10 MG tablet Commonly known as: ARAVA  Take 1 tablet (10 mg total) by mouth daily. Start taking on: April 22, 2024 What changed: These instructions start on April 22, 2024. If you are unsure what to do until then, ask your doctor or other care provider.   levalbuterol  0.63 MG/3ML nebulizer solution Commonly known as: XOPENEX  Take 3 mLs (0.63 mg total) by nebulization 3 (three) times daily for 3 days, THEN 3 mLs (0.63 mg total) every 6 (six) hours as needed. Start taking on: April 17, 2024   Ohtuvayre  3 MG/2.5ML Susp Generic  drug: Ensifentrine    oxyCODONE -acetaminophen  10-325 MG tablet Commonly known as: PERCOCET Take 1 tablet by mouth 3 (three) times daily as needed.   Ozempic (2 MG/DOSE) 8 MG/3ML Sopn Generic drug: Semaglutide (2 MG/DOSE) Inject 2 mg into the skin once a week.   predniSONE  20 MG tablet Commonly known as: DELTASONE  Take 2 tablets (40 mg total) by mouth daily before breakfast for 3 days, THEN 1 tablet (20 mg total) daily before breakfast for 3 days. Start taking on: April 18, 2024 What changed: Another medication with the same name was paused. Ask your nurse or doctor if you should take this medication. Replaces: predniSONE  10 MG (21) Tbpk tablet   pregabalin  200 MG capsule Commonly known as: LYRICA  Take 200 mg by mouth 3 (three) times daily.   promethazine -dextromethorphan 6.25-15 MG/5ML syrup  Commonly known as: PROMETHAZINE -DM Take 5 mLs by mouth 4 (four) times daily as needed.   propafenone 150 MG tablet Commonly known as: RYTHMOL Take 150 mg by mouth 3 (three) times daily. What changed: Another medication with the same name was removed. Continue taking this medication, and follow the directions you see here.   simvastatin 20 MG tablet Commonly known as: ZOCOR Take 20 mg by mouth every evening.   tamsulosin  0.4 MG Caps capsule Commonly known as: FLOMAX  Take 0.4 mg by mouth daily.       Allergies  Allergen Reactions   Nsaids Shortness Of Breath   Aspirin Other (See Comments)    Unknown childhood reaction   Flagyl  [Metronidazole ]    Macrobid [Nitrofurantoin Macrocrystal] Nausea And Vomiting    Follow-up Information     Stoneking, Adine PARAS., MD Follow up in 2 week(s).   Specialty: Urology Contact information: [address] [place] [address] [phone]         [facility]. Schedule an appointment as soon as possible for a visit in 2 week(s).   Why: Follow-up in 1 to 2 weeks. Contact information: [address]  [place] [address] [phone]                  The results of significant diagnostics from this hospitalization (including imaging, microbiology, ancillary and laboratory) are listed below for reference.    Significant Diagnostic Studies: DG CHEST PORT 1 VIEW Result Date: 04/16/2024 CLINICAL DATA:  Wheezing. EXAM: PORTABLE CHEST 1 VIEW COMPARISON:  12/14/2023. FINDINGS: Bilateral lung fields are clear. Bilateral costophrenic angles are clear. Normal cardio-mediastinal silhouette. No acute osseous abnormalities. The soft tissues are within normal limits. IMPRESSION: No active disease. Electronically Signed   By: Ree Molt M.D.   On: 04/16/2024 10:12   IR NEPHROSTOMY PLACEMENT LEFT Result Date: 04/15/2024 INDICATION: 48 year old female obstructive ureteral nephrolithiasis status post failed attempted retrograde stent placement. EXAM: 1. ULTRASOUND GUIDANCE FOR PUNCTURE OF THE LEFT RENAL COLLECTING SYSTEM 2. LEFT PERCUTANEOUS NEPHROSTOMY TUBE PLACEMENT. COMPARISON:  None Available. MEDICATIONS: Ciprofloxacin  400 mg IV; The antibiotic was administered in an appropriate time frame prior to skin puncture. 25 mg Benadryl , intravenous ANESTHESIA/SEDATION: Moderate (conscious) sedation was employed during this procedure. A total of Versed  2 mg and Fentanyl  100 mcg was administered intravenously. Moderate Sedation Time: 12 minutes. The patient's level of consciousness and vital signs were monitored continuously by radiology nursing throughout the procedure under my direct supervision. CONTRAST:  Ten mL Isovue 300 - administered into the renal collecting system FLUOROSCOPY TIME:  Nine mGy reference air kerma COMPLICATIONS: None immediate. PROCEDURE: The procedure, risks, benefits, and alternatives were explained to the patient. Questions regarding the procedure were encouraged and answered. The patient understands and consents to the procedure. A timeout was performed prior to the initiation of  the procedure. The left flank region was prepped and draped in the usual sterile fashion and a sterile drape was applied covering the operative field. A sterile gown and sterile gloves were used for the procedure. Local anesthesia was provided with 1% Lidocaine  with epinephrine . Ultrasound was used to localize the left kidney. Under direct ultrasound guidance, a 20 gauge needle was advanced into the renal collecting system. An ultrasound image documentation was performed. Access within the collecting system was confirmed with the efflux of urine followed by limited contrast injection. Over a Nitrex wire, the tract was dilated with an Accustick stent. Next, under intermittent fluoroscpic guidance and over  a short Amplatz wire, the track was dilated ultimately allowing placement of a 10-French percutaneous nephrostomy catheter which was advanced to the level of the renal pelvis where the coil was formed and locked. Contrast was injected and several spot fluoroscopic images were obtained in various obliquities. The catheter was secured at the skin with a Prolene retention suture and stat lock device and connected to a gravity bag was placed. Dressings were applied. The patient tolerated procedure well without immediate postprocedural complication. FINDINGS: Ultrasound scanning demonstrates a moderate to severely dilated left collecting system. Under a combination of ultrasound and fluoroscopic guidance, a posterior inferior calix was targeted allowing placement of a 10-French percutaneous nephrostomy catheter with end coiled and locked within the renal pelvis. Contrast injection confirmed appropriate positioning. IMPRESSION: Successful ultrasound and fluoroscopic guided placement of a left sided 10 French PCN. Ester Sides, MD [facility] Electronically Signed   By: Ester Sides M.D.   On: 04/15/2024 16:32   DG C-Arm 1-60 Min-No Report Result Date:  04/15/2024 Fluoroscopy was utilized by the requesting physician.  No radiographic interpretation.   CT Renal Stone Study Result Date: 04/14/2024 CLINICAL DATA:  Abdominal/flank pain, stone suspected EXAM: CT ABDOMEN AND PELVIS WITHOUT CONTRAST TECHNIQUE: Multidetector CT imaging of the abdomen and pelvis was performed following the standard protocol without IV contrast. RADIATION DOSE REDUCTION: This exam was performed according to the departmental dose-optimization program which includes automated exposure control, adjustment of the mA and/or kV according to patient size and/or use of iterative reconstruction technique. COMPARISON:  09/10/2021 FINDINGS: Of note, the lack of intravenous contrast limits evaluation of the solid organ parenchyma and vascularity. Lower chest: No focal airspace consolidation or pleural effusion. Hepatobiliary: No mass.Cholecystectomy. No intrahepatic or extrahepatic biliary ductal dilation. Pancreas: No mass or main ductal dilation. No peripancreatic inflammation or fluid collection. Spleen: Normal size. No mass. Adrenals/Urinary Tract: No adrenal masses. 2.5 cm right upper pole cyst. Moderate left-sided hydronephrosis due to an obstructive calculus in the proximal left ureter, measuring 9 x 11 x 14 mm. Punctate nonobstructive bilateral calyceal calculi also present. The urinary bladder is completely decompressed. Stomach/Bowel: The stomach is decompressed without focal abnormality. No small bowel wall thickening or inflammation. No small bowel obstruction.Normal appendix. Descending and sigmoid colonic diverticulosis. No changes of acute diverticulitis. Vascular/Lymphatic: No aortic aneurysm. Diffuse aortoiliac atherosclerosis. No intraabdominal or pelvic lymphadenopathy. Reproductive: fluid density structure in the right adnexa measuring 4.3 cm (axial 63). No free pelvic fluid. Other: No pneumoperitoneum, ascites, or mesenteric inflammation. Musculoskeletal: No acute fracture or  destructive lesion. Multilevel thoracolumbar osteophytosis. IMPRESSION: 1. Obstructive proximal left ureteral calculus, measuring 9 x 11 x 14 mm, causing moderate hydronephrosis. 2. Fluid density structure in the right adnexa, measuring 4.3 cm (axial 63), which smaller than on the prior study, possibly an ovarian cyst, paraovarian cyst, or chronic hydrosalpinx. 3. Total colonic diverticulosis. No changes of acute diverticulitis. Aortic Atherosclerosis (ICD10-I70.0). Electronically Signed   By: Rogelia Myers M.D.   On: 04/14/2024 22:10    Microbiology: Recent Results (from the past 240 hours)  Urine Culture     Status: Abnormal   Collection Time: 04/14/24 10:29 PM   Specimen: Urine, Clean Catch  Result Value Ref Range Status   Specimen Description   Final    URINE, CLEAN CATCH Performed at [facility], [address], [place], [place] [address]    Special Requests   Final    NONE Performed at [facility], [address] [place],  [place] [address]    Culture (A)  Final    <10,000 COLONIES/mL INSIGNIFICANT GROWTH Performed at [facility], [address], [place], [place] [address]    Report Status 04/16/2024 FINAL  Final  Blood culture (routine x 2)     Status: None (Preliminary result)   Collection Time: 04/15/24  7:08 AM   Specimen: BLOOD  Result Value Ref Range Status   Specimen Description   Final    BLOOD BLOOD LEFT ARM AEROBIC BOTTLE ONLY ANAEROBIC BOTTLE ONLY Performed at [facility], [address], [place], [place] [address]    Special Requests   Final    BOTTLES DRAWN AEROBIC AND ANAEROBIC Blood Culture adequate volume Performed at [facility], [address], [place], [place] [address]    Culture   Final    NO GROWTH 2 DAYS Performed at [facility], [address], [place], [place] [address]    Report Status PENDING  Incomplete  Blood culture (routine x 2)     Status: None (Preliminary result)   Collection Time:  04/15/24  7:08 AM   Specimen: BLOOD  Result Value Ref Range Status   Specimen Description   Final    BLOOD BLOOD LEFT HAND AEROBIC BOTTLE ONLY ANAEROBIC BOTTLE ONLY Performed at [facility], [address], [place], [place] [address]    Special Requests   Final    BOTTLES DRAWN AEROBIC AND ANAEROBIC Blood Culture results may not be optimal due to an inadequate volume of blood received in culture bottles Performed at [facility], [address], [place], [place] [address]    Culture   Final    NO GROWTH 2 DAYS Performed at [facility], [address], [place], [place] [address]    Report Status PENDING  Incomplete     Labs: Basic Metabolic Panel: Recent Labs  Lab 04/14/24 2122 04/16/24 0419 04/17/24 0423  NA 139 139 140  K 3.9 4.3 4.0  CL 100 108 109  CO2 30 23 23   GLUCOSE 111* 175* 264*  BUN 13 22* 22*  CREATININE 1.24* 1.13* 0.90  CALCIUM 9.7 8.9 8.6*   Liver Function Tests: Recent Labs  Lab 04/14/24 2122 04/16/24 0419  AST 15 15  ALT 16 13  ALKPHOS 70 61  BILITOT 0.6 0.6  PROT 6.5 6.0*  ALBUMIN 3.6 3.1*   Recent Labs  Lab 04/14/24 2122  LIPASE 35   No results for input(s): AMMONIA in the last 168 hours. CBC: Recent Labs  Lab 04/14/24 2122 04/16/24 0419 04/17/24 0423  WBC 8.0 12.1* 13.4*  NEUTROABS  --   --  12.1*  HGB 13.7 12.7 12.5  HCT 41.4 40.6 39.0  MCV 94.1 97.1 96.8  PLT 206 191 220   Cardiac Enzymes: No results for input(s): CKTOTAL, CKMB, CKMBINDEX, TROPONINI in the last 168 hours. BNP: BNP (last 3 results) Recent Labs    04/16/24 0419  BNP 51.3    ProBNP (last 3 results) No results for input(s): PROBNP in the last 8760 hours.  CBG: Recent Labs  Lab 04/16/24 1115 04/16/24 1644 04/16/24 2029 04/17/24 0736 04/17/24 1107  GLUCAP 273* 196* 240* 205* 204*       Signed:  Toribio Hummer MD.  [facility] 04/17/2024, 1:39 PM

## 2024-04-18 ENCOUNTER — Other Ambulatory Visit (HOSPITAL_BASED_OUTPATIENT_CLINIC_OR_DEPARTMENT_OTHER): Payer: Self-pay

## 2024-04-19 ENCOUNTER — Emergency Department (HOSPITAL_COMMUNITY)

## 2024-04-19 ENCOUNTER — Emergency Department (HOSPITAL_COMMUNITY)
Admission: EM | Admit: 2024-04-19 | Discharge: 2024-04-19 | Disposition: A | Discharge status: Home / Self Care | Attending: Emergency Medicine | Admitting: Emergency Medicine

## 2024-04-19 ENCOUNTER — Other Ambulatory Visit: Payer: Self-pay

## 2024-04-19 ENCOUNTER — Encounter (HOSPITAL_COMMUNITY): Payer: Self-pay | Admitting: Emergency Medicine

## 2024-04-19 DIAGNOSIS — G8918 Other acute postprocedural pain: Secondary | ICD-10-CM | POA: Insufficient documentation

## 2024-04-19 DIAGNOSIS — T83092A Other mechanical complication of nephrostomy catheter, initial encounter: Secondary | ICD-10-CM | POA: Insufficient documentation

## 2024-04-19 DIAGNOSIS — Y732 Prosthetic and other implants, materials and accessory gastroenterology and urology devices associated with adverse incidents: Secondary | ICD-10-CM | POA: Diagnosis not present

## 2024-04-19 DIAGNOSIS — N99528 Other complication of other external stoma of urinary tract: Secondary | ICD-10-CM

## 2024-04-19 LAB — URINALYSIS, W/ REFLEX TO CULTURE (INFECTION SUSPECTED)
Bacteria, UA: NONE SEEN
Bilirubin Urine: NEGATIVE
Glucose, UA: NEGATIVE mg/dL
Hgb urine dipstick: NEGATIVE
Ketones, ur: NEGATIVE mg/dL
Nitrite: NEGATIVE
Protein, ur: NEGATIVE mg/dL
Specific Gravity, Urine: 1.018 (ref 1.005–1.030)
pH: 6 (ref 5.0–8.0)

## 2024-04-19 LAB — CBC WITH DIFFERENTIAL/PLATELET
Abs Immature Granulocytes: 0.1 K/uL — ABNORMAL HIGH (ref 0.00–0.07)
Basophils Absolute: 0 K/uL (ref 0.0–0.1)
Basophils Relative: 0 %
Eosinophils Absolute: 0 K/uL (ref 0.0–0.5)
Eosinophils Relative: 0 %
HCT: 44.3 % (ref 36.0–46.0)
Hemoglobin: 14.4 g/dL (ref 12.0–15.0)
Immature Granulocytes: 1 %
Lymphocytes Relative: 12 %
Lymphs Abs: 1.5 K/uL (ref 0.7–4.0)
MCH: 30.7 pg (ref 26.0–34.0)
MCHC: 32.5 g/dL (ref 30.0–36.0)
MCV: 94.5 fL (ref 80.0–100.0)
Monocytes Absolute: 0.9 K/uL (ref 0.1–1.0)
Monocytes Relative: 8 %
Neutro Abs: 9.4 K/uL — ABNORMAL HIGH (ref 1.7–7.7)
Neutrophils Relative %: 79 %
Platelets: 236 K/uL (ref 150–400)
RBC: 4.69 MIL/uL (ref 3.87–5.11)
RDW: 13 % (ref 11.5–15.5)
WBC: 11.9 K/uL — ABNORMAL HIGH (ref 4.0–10.5)
nRBC: 0 % (ref 0.0–0.2)

## 2024-04-19 LAB — COMPREHENSIVE METABOLIC PANEL WITH GFR
ALT: 18 U/L (ref 0–44)
AST: 16 U/L (ref 15–41)
Albumin: 3.5 g/dL (ref 3.5–5.0)
Alkaline Phosphatase: 66 U/L (ref 38–126)
Anion gap: 10 (ref 5–15)
BUN: 23 mg/dL — ABNORMAL HIGH (ref 6–20)
CO2: 26 mmol/L (ref 22–32)
Calcium: 9 mg/dL (ref 8.9–10.3)
Chloride: 105 mmol/L (ref 98–111)
Creatinine, Ser: 0.95 mg/dL (ref 0.44–1.00)
GFR, Estimated: >60 mL/min (ref >60)
Glucose, Bld: 125 mg/dL — ABNORMAL HIGH (ref 70–99)
Potassium: 4.2 mmol/L (ref 3.5–5.1)
Sodium: 141 mmol/L (ref 135–145)
Total Bilirubin: 0.4 mg/dL (ref 0.0–1.2)
Total Protein: 7 g/dL (ref 6.5–8.1)

## 2024-04-19 LAB — I-STAT CG4 LACTIC ACID, ED: Lactic Acid, Venous: 1.3 mmol/L (ref 0.5–1.9)

## 2024-04-19 MED ORDER — LACTATED RINGERS IV BOLUS
1000.0000 mL | Freq: Once | INTRAVENOUS | Status: AC
  Administered 2024-04-19: 1000 mL via INTRAVENOUS

## 2024-04-19 MED ORDER — IOHEXOL 300 MG/ML  SOLN
100.0000 mL | Freq: Once | INTRAMUSCULAR | Status: AC | PRN
  Administered 2024-04-19: 100 mL via INTRAVENOUS

## 2024-04-19 MED ORDER — LIDOCAINE HCL (PF) 1 % IJ SOLN
5.0000 mL | Freq: Once | INTRAMUSCULAR | Status: AC
  Administered 2024-04-19: 5 mL
  Filled 2024-04-19: qty 30

## 2024-04-19 NOTE — ED Triage Notes (Signed)
 Patient c/o left flank pain x 1 day. Patient report she was discharge yesterday with her nephrostomy tube. Patient report worsening pain tonight. Patient report her output in her bag start to turn red tonight. Patient report nausea, denies vomiting. Patient denies fever.

## 2024-04-19 NOTE — ED Provider Notes (Signed)
 [place] EMERGENCY DEPARTMENT AT [facility] Provider Note   CSN: [id number] Arrival date & time: 04/19/24  0012     History Chief Complaint  Patient presents with   Post-op Problem    HPI Cassandra Lucas is a 48 y.o. female presenting for chief complaint of postop problem.  She states that one of the stitches that was holding her nephrostomy tube busted and she is worried the nephrostomy tube was dislodged. She states it continues to drain but she did note some bloody drainage into the day.  States it is cleared up by time of my evaluation.  Denies fevers chills nausea vomiting syncope shortness of breath..   Patient's recorded medical, surgical, social, medication list and allergies were reviewed in the Snapshot window as part of the initial history.   Review of Systems   Review of Systems  Constitutional:  Negative for chills and fever.  HENT:  Negative for ear pain and sore throat.   Eyes:  Negative for pain and visual disturbance.  Respiratory:  Negative for cough and shortness of breath.   Cardiovascular:  Negative for chest pain and palpitations.  Gastrointestinal:  Negative for abdominal pain and vomiting.  Genitourinary:  Negative for dysuria and hematuria.  Musculoskeletal:  Negative for arthralgias and back pain.  Skin:  Negative for color change and rash.  Neurological:  Negative for seizures and syncope.  All other systems reviewed and are negative.   Physical Exam Updated Vital Signs BP (!) 158/105 (BP Location: Right Arm)   Pulse (!) 110   Temp 98.4 F (36.9 C) (Oral)   Resp (!) 22   Ht 4' 10 (1.473 m)   Wt 82.1 kg   LMP 01/22/2011   SpO2 97%   BMI 37.83 kg/m  Physical Exam Vitals and nursing note reviewed.  Constitutional:      General: She is not in acute distress.    Appearance: She is well-developed.  HENT:     Head: Normocephalic and atraumatic.  Eyes:     Conjunctiva/sclera: Conjunctivae normal.  Cardiovascular:     Rate  and Rhythm: Normal rate and regular rhythm.     Heart sounds: No murmur heard. Pulmonary:     Effort: Pulmonary effort is normal. No respiratory distress.     Breath sounds: Normal breath sounds.  Abdominal:     General: There is no distension.     Palpations: Abdomen is soft.     Tenderness: There is no abdominal tenderness. There is no right CVA tenderness or left CVA tenderness.  Musculoskeletal:        General: Signs of injury (Both of the stitches that were attached to the nephrostomy tube appeared to become dislodged.  Nephrostomy tube appears held in place by a distal bandage.) present. No swelling or tenderness. Normal range of motion.     Cervical back: Neck supple.  Skin:    General: Skin is warm and dry.  Neurological:     General: No focal deficit present.     Mental Status: She is alert and oriented to person, place, and time. Mental status is at baseline.     Cranial Nerves: No cranial nerve deficit.      ED Course/ Medical Decision Making/ A&P    Procedures Procedures   Medications Ordered in ED Medications  lidocaine  (PF) (XYLOCAINE ) 1 % injection 5 mL (has no administration in time range)  lactated ringers  bolus 1,000 mL (1,000 mLs Intravenous New Bag/Given 04/19/24 0055)  iohexol  (  OMNIPAQUE ) 300 MG/ML solution 100 mL (100 mLs Intravenous Contrast Given 04/19/24 0149)    Medical Decision Making:   Cassandra Lucas is a 48 y.o. female presenting with concern for dislodged nephrostomy tube.  Urine is still freely flowing at this time.  Lab work performed without evidence of acute infection as white blood cell count appears to be downtrending.  No evidence of AKI. CT scan done shows appropriate location of the nephrostomy tube.  Continues to drain during 4 hours of observation.  A stitch was placed to reinforce holding the tube in appropriate position using standard technique. Patient tolerated procedure in no acute distress.  Nursing team to place clean bandage to  site prior to discharge.  Disposition:  I have considered need for hospitalization, however, considering all of the above, I believe this patient is stable for discharge at this time.  Patient/family educated about specific return precautions for given chief complaint and symptoms.  Patient/family educated about follow-up with PCP .     Patient/family expressed understanding of return precautions and need for follow-up. Patient spoken to regarding all imaging and laboratory results and appropriate follow up for these results. All education provided in verbal form with additional information in written form. Time was allowed for answering of patient questions. Patient discharged.    Emergency Department Medication Summary:   Medications  lidocaine  (PF) (XYLOCAINE ) 1 % injection 5 mL (has no administration in time range)  lactated ringers  bolus 1,000 mL (1,000 mLs Intravenous New Bag/Given 04/19/24 0055)  iohexol  (OMNIPAQUE ) 300 MG/ML solution 100 mL (100 mLs Intravenous Contrast Given 04/19/24 0149)        Clinical Impression:  1. Post-operative pain   2. Nephrostomy complication [facility])      Discharge   Final Clinical Impression(s) / ED Diagnoses Final diagnoses:  Post-operative pain  Nephrostomy complication [facility])    Rx / DC Orders ED Discharge Orders     None         Jerral Meth, MD 04/19/24 [phone]

## 2024-04-20 LAB — CULTURE, BLOOD (ROUTINE X 2)
Culture: NO GROWTH
Culture: NO GROWTH
Special Requests: ADEQUATE

## 2024-04-26 ENCOUNTER — Other Ambulatory Visit (HOSPITAL_COMMUNITY): Payer: Self-pay

## 2024-04-27 ENCOUNTER — Other Ambulatory Visit: Payer: Self-pay

## 2024-04-27 ENCOUNTER — Emergency Department (HOSPITAL_COMMUNITY)
Admission: EM | Admit: 2024-04-27 | Discharge: 2024-04-27 | Disposition: A | Discharge status: Home / Self Care | Attending: Emergency Medicine | Admitting: Emergency Medicine

## 2024-04-27 ENCOUNTER — Encounter (HOSPITAL_COMMUNITY): Payer: Self-pay | Admitting: Emergency Medicine

## 2024-04-27 DIAGNOSIS — R319 Hematuria, unspecified: Secondary | ICD-10-CM | POA: Insufficient documentation

## 2024-04-27 DIAGNOSIS — L0390 Cellulitis, unspecified: Secondary | ICD-10-CM

## 2024-04-27 DIAGNOSIS — L539 Erythematous condition, unspecified: Secondary | ICD-10-CM | POA: Insufficient documentation

## 2024-04-27 LAB — URINALYSIS, ROUTINE W REFLEX MICROSCOPIC
Bacteria, UA: NONE SEEN
Bilirubin Urine: NEGATIVE
Glucose, UA: 50 mg/dL — AB
Ketones, ur: NEGATIVE mg/dL
Nitrite: NEGATIVE
Protein, ur: NEGATIVE mg/dL
Specific Gravity, Urine: 1.012 (ref 1.005–1.030)
pH: 6 (ref 5.0–8.0)

## 2024-04-27 MED ORDER — AMOXICILLIN-POT CLAVULANATE 875-125 MG PO TABS
1.0000 | ORAL_TABLET | Freq: Once | ORAL | Status: AC
  Administered 2024-04-27: 1 via ORAL
  Filled 2024-04-27: qty 1

## 2024-04-27 MED ORDER — AMOXICILLIN-POT CLAVULANATE 500-125 MG PO TABS: 1.0000 | ORAL_TABLET | Freq: Three times a day (TID) | ORAL | 0 refills | Status: DC

## 2024-04-27 NOTE — ED Triage Notes (Signed)
 Pt here with c/o hematuria that she states is clearing up after she was taking a shower and her rag got caught on tube. Pt states that now she has an infection  at the insertion site.

## 2024-04-27 NOTE — Discharge Instructions (Signed)
 Begin taking Augmentin  as prescribed.  Follow-up with your urologist in the next few days, and return to the ER if symptoms significantly worsen or change.

## 2024-04-27 NOTE — ED Provider Notes (Signed)
 [place] EMERGENCY DEPARTMENT AT [facility] Provider Note   CSN: [id number] Arrival date & time: 04/27/24  0159     Patient presents with: Hematuria   Cassandra Lucas is a 48 y.o. female.   Patient is a 48 year old female with past medical history of kidney stones.  She was diagnosed with a large ureteral stone recently.  The urologist was unable to pass a stent, so a nephrostomy tube was placed.  This evening, patient was in the shower and she accidentally pulled her nephrostomy tube with a washcloth.  She then began noticing blood in the tube draining into the bag.  She is also concerned about the appearance of the nephrostomy tube insertion site.  There is surrounding redness and the daughter noticed purulent material when changing her dressing this evening.  Patient denies any fevers or chills.       Prior to Admission medications   Medication Sig Start Date End Date Taking? Authorizing Provider  acetaminophen  (TYLENOL ) 500 MG tablet Take 1,000 mg by mouth daily as needed for mild pain.     [provider]  albuterol  (PROVENTIL ) (2.5 MG/3ML) 0.083% nebulizer solution Take 2.5 mg by nebulization every 6 (six) hours as needed for wheezing or shortness of breath.    [provider]  albuterol  (VENTOLIN  HFA) 108 (90 Base) MCG/ACT inhaler Inhale 1-2 puffs into the lungs every 6 (six) hours as needed for wheezing or shortness of breath. 07/31/21   Stuart Vernell Norris, PA-C  alendronate (FOSAMAX) 70 MG tablet Take 70 mg by mouth once a week. 09/15/22   [provider]  BELBUCA  150 MCG FILM Take 150 mcg by mouth 2 (two) times daily. 12/07/23   [provider]  budeson-glycopyrrolate -formoterol (BREZTRI  AEROSPHERE) 160-9-4.8 MCG/ACT AERO Inhale 2 puffs into the lungs in the morning and at bedtime. Patient taking differently: Inhale 2 puffs into the lungs 2 (two) times daily as needed. 12/11/23   Neysa Rama D, MD  cetirizine  (ZYRTEC   ALLERGY) 10 MG tablet Take 1 tablet (10 mg total) by mouth daily. 07/31/21   Stuart Vernell Norris, PA-C  cyclobenzaprine  (FLEXERIL ) 10 MG tablet Take 10 mg by mouth every evening. 07/22/21   [provider]  famotidine  (PEPCID ) 40 MG tablet Take 40 mg by mouth daily. 11/28/23   [provider]  fluticasone  (FLONASE ) 50 MCG/ACT nasal spray Place 1 spray into both nostrils 2 (two) times daily. Patient taking differently: Place 1 spray into both nostrils as needed for allergies. 07/31/21   Stuart Vernell Norris, PA-C  glipiZIDE (GLUCOTROL) 10 MG tablet Take 10 mg by mouth daily. Patient not taking: Reported on 04/15/2024 11/05/23   [provider]  hydroxychloroquine  (PLAQUENIL ) 200 MG tablet Take 400 mg by mouth daily.    [provider]  Ipratropium-Albuterol  (COMBIVENT RESPIMAT) 20-100 MCG/ACT AERS respimat Inhale 1 puff into the lungs every 6 (six) hours as needed for wheezing or shortness of breath. 10/23/18   [provider]  leflunomide  (ARAVA ) 10 MG tablet Take 1 tablet (10 mg total) by mouth daily. 04/22/24   Sebastian Toribio GAILS, MD  levalbuterol  (XOPENEX ) 0.63 MG/3ML nebulizer solution Take 3 mLs (0.63 mg total) by nebulization 3 (three) times daily for 3 days, THEN 3 mLs (0.63 mg total) every 6 (six) hours as needed. 04/17/24 04/20/25  Sebastian Toribio GAILS, MD  Multiple Vitamins-Minerals ([employer] ADULT MULTIVITAMIN) CHEW Chew 2 tablets by mouth daily.    [provider]  oxyCODONE -acetaminophen  (PERCOCET) 10-325 MG tablet Take 1  tablet by mouth 3 (three) times daily as needed. 04/04/24   [provider]  OZEMPIC, 2 MG/DOSE, 8 MG/3ML SOPN Inject 2 mg into the skin once a week. 11/13/23   [provider]  predniSONE  (DELTASONE ) 5 MG tablet Take 5 mg by mouth daily with breakfast.    [provider]  pregabalin  (LYRICA ) 200 MG capsule Take 200 mg by mouth 3 (three) times daily. 03/22/24   [provider]   promethazine -dextromethorphan (PROMETHAZINE -DM) 6.25-15 MG/5ML syrup Take 5 mLs by mouth 4 (four) times daily as needed. Patient not taking: Reported on 12/11/2023 11/28/23   Stuart Vernell Norris, PA-C  propafenone (RYTHMOL) 150 MG tablet Take 150 mg by mouth 3 (three) times daily. Patient not taking: Reported on 04/15/2024 03/17/24   [provider]  simvastatin (ZOCOR) 20 MG tablet Take 20 mg by mouth every evening. 02/06/24   [provider]  tamsulosin  (FLOMAX ) 0.4 MG CAPS capsule Take 0.4 mg by mouth daily. 03/22/24   [provider]  Cimetidine (HEARTBURN RELIEF PO) Take 1 tablet by mouth daily as needed (heartburn).  05/31/20  [provider]    Allergies: Nsaids, Aspirin, Flagyl  [metronidazole ], and Macrobid [nitrofurantoin macrocrystal]    Review of Systems  All other systems reviewed and are negative.   Updated Vital Signs BP (!) 137/102   Pulse (!) 115   Temp 99.6 F (37.6 C) (Oral)   Resp 20   Ht 4' 10 (1.473 m)   Wt 82 kg   LMP 01/22/2011   SpO2 95%   BMI 37.78 kg/m   Physical Exam Vitals and nursing note reviewed.  Constitutional:      Appearance: Normal appearance.  Pulmonary:     Effort: Pulmonary effort is normal.  Abdominal:     General: Abdomen is flat. There is no distension.     Comments: The nephrostomy tube site appears well.  The stitches are intact.  There is mild surrounding erythema, but no significant purulent drainage.  Neurological:     Mental Status: She is alert.     (all labs ordered are listed, but only abnormal results are displayed) Labs Reviewed  URINALYSIS, ROUTINE W REFLEX MICROSCOPIC    EKG: None  Radiology: No results found.   Procedures   Medications Ordered in the ED - No data to display                                  Medical Decision Making Amount and/or Complexity of Data Reviewed Labs: ordered.   The blood noted in the nephrostomy tube has cleared since arriving in the  ER.  The site of the nephrostomy tube has erythema surrounding the tube insertion, but no drainage that I can see.  Patient is concerned about infection, so will be treated with antibiotics.  She is to follow-up with her urologist in the near future.     Final diagnoses:  None    ED Discharge Orders     None          Geroldine Berg, MD 04/27/24 [phone]

## 2024-04-29 ENCOUNTER — Other Ambulatory Visit (HOSPITAL_COMMUNITY): Payer: Self-pay

## 2024-04-30 ENCOUNTER — Telehealth (HOSPITAL_COMMUNITY): Payer: Self-pay | Admitting: Pharmacy Technician

## 2024-04-30 ENCOUNTER — Other Ambulatory Visit (HOSPITAL_COMMUNITY): Payer: Self-pay

## 2024-04-30 ENCOUNTER — Other Ambulatory Visit: Payer: Self-pay

## 2024-04-30 NOTE — Telephone Encounter (Signed)
 Pharmacy Patient Advocate Encounter  Received notification from [facility] that Prior Authorization for Levalbuterol  HCl 0.63MG /3ML nebulizer solution  has been APPROVED from 04/30/2024 to 04/30/2025   PA #/Case ID/Reference #: [id number] KEY: [id number]

## 2024-05-01 ENCOUNTER — Other Ambulatory Visit: Payer: Self-pay | Admitting: Urology

## 2024-05-01 DIAGNOSIS — N201 Calculus of ureter: Secondary | ICD-10-CM

## 2024-05-02 ENCOUNTER — Telehealth: Payer: Self-pay | Admitting: Urology

## 2024-05-02 NOTE — Telephone Encounter (Signed)
 LVM to call back and schedule

## 2024-05-02 NOTE — Telephone Encounter (Signed)
-----   Message from Adine Manly sent at 05/01/2024  4:48 PM EDT ----- Please schedule this patient for a follow-up visit in the next 2 weeks.  Overbook if needed.

## 2024-05-08 ENCOUNTER — Telehealth: Payer: Self-pay | Admitting: Internal Medicine

## 2024-05-08 DIAGNOSIS — G4733 Obstructive sleep apnea (adult) (pediatric): Secondary | ICD-10-CM

## 2024-05-08 NOTE — Telephone Encounter (Signed)
 The home sleep test showed severe obstructive sleep apnea, averaging 37.9 apnea events/ hour, with drops in blood oxygen  level.  I suggest we start CPAP, which is the best initial treatment for scores in this range.  ORDER:  new DME, new CPAP auto 5-20, mask of choice, humidifier, supplies, AirView/ Card  Will need office follow-up 31-90 days following, per insurance requirements

## 2024-05-08 NOTE — Telephone Encounter (Signed)
ATC x 1 

## 2024-05-08 NOTE — Telephone Encounter (Signed)
Please advise on sleep study results

## 2024-05-08 NOTE — Telephone Encounter (Signed)
 PT would like sleep study results. Please contact at [phone]

## 2024-05-12 ENCOUNTER — Encounter: Payer: Self-pay | Admitting: Urology

## 2024-05-12 ENCOUNTER — Ambulatory Visit: Admitting: Urology

## 2024-05-12 VITALS — BP 146/100 | HR 131 | Temp 97.5°F | Ht <= 58 in | Wt 184.0 lb

## 2024-05-12 DIAGNOSIS — N201 Calculus of ureter: Secondary | ICD-10-CM | POA: Diagnosis not present

## 2024-05-12 LAB — URINALYSIS, ROUTINE W REFLEX MICROSCOPIC
Bilirubin, UA: NEGATIVE
Glucose, UA: NEGATIVE
Ketones, UA: NEGATIVE
Nitrite, UA: NEGATIVE
Specific Gravity, UA: 1.025 (ref 1.005–1.030)
Urobilinogen, Ur: 0.2 mg/dL (ref 0.2–1.0)
pH, UA: 7 (ref 5.0–7.5)

## 2024-05-12 LAB — MICROSCOPIC EXAMINATION: Crystals: POSITIVE — AB

## 2024-05-12 NOTE — Progress Notes (Signed)
 Assessment: 1. Ureterolithiasis, left, proximal     Plan: I personally reviewed the patient's chart including provider notes, lab and imaging results. I personally reviewed the CT study from 04/15/2024 and 04/19/2024. I discussed recommendations for continued management of the large proximal left ureteral calculus. I recommended attempted placement of a antegrade stent by interventional radiology.  This would allow removal of the nephrostomy tube and assist with surgical management of the left ureteral calculus.  I discussed options including shockwave lithotripsy and ureteroscopic laser lithotripsy. Request placement of an antegrade left ureteral stent by interventional radiology.  Return to office after stent placement for discussion of management of ureteral calculus.  Chief Complaint: Chief Complaint  Patient presents with   Nephrolithiasis    HPI: Cassandra Lucas is a 48 y.o. female who presents for continued evaluation of left ureteral calculus. She was seen as a hospital consult on 04/15/2024.  She reported a long history of nephrolithiasis passing the majority of her stone spontaneously.  She presented to the emergency room on 04/14/2024 with a 3-week history of left-sided flank pain.  She had some associated nausea without vomiting.  No fevers or chills.  Creatinine 1.24. CT renal stone study showed a 9 x 11 x 14 mm stone in the left proximal ureter with associated left hydronephrosis. Urinalysis showed >50 RBCs, >50 WBCs, rare bacteria. She was taken to the operating room on 04/15/2024 for cystoscopy and left ureteral stent placement.  I was unable to pass a guidewire beyond the proximal left ureteral calculus. She subsequently underwent insertion of a left nephrostomy tube by interventional radiology on 04/15/2024. Urine culture grew <10K colonies. She presented back to the emergency room on 04/19/2024 with flank pain. CT imaging showed decreased left hydronephrosis with a 12 mm  stone in the proximal left ureter.  She presents today for follow-up.  She continues with the left nephrostomy tube.  She has had some intermittent discomfort in the flank area associated with the nephrostomy tube.  Her urine has been fairly clear.  She has had output between [phone] mL/day from the left kidney.  No fevers or chills.  She recently completed a course of Bactrim  for a UTI.  No culture results available.  Portions of the above documentation were copied from a prior visit for review purposes only.  Allergies: Allergies  Allergen Reactions   Nsaids Shortness Of Breath   Aspirin Other (See Comments)    Unknown childhood reaction   Flagyl  [Metronidazole ]    Macrobid [Nitrofurantoin Macrocrystal] Nausea And Vomiting    PMH: Past Medical History:  Diagnosis Date   Anemia    Anxiety    Arthritis    Asthma    Carpal tunnel syndrome, bilateral 11/22/2015   Will try wrist splints   Community acquired pneumonia    COPD (chronic obstructive pulmonary disease) (HCC)    Decreased libido 03/26/2015   Diabetes mellitus without complication (HCC)    prediabetes   Dyslipidemia 11/24/2015   Empty sella (HCC)    Fibromyalgia    Fibromyalgia    Hot flashes 03/26/2015   Hyperlipidemia    Kidney stones    Productive cough    smoker   Renal disorder    Vitamin D  deficiency 11/24/2015    PSH: Past Surgical History:  Procedure Laterality Date   ABDOMINAL HYSTERECTOMY     has ovaries.    BREAST CYST EXCISION     CESAREAN SECTION  2006   [facility]   CYSTOSCOPY WITH RETROGRADE PYELOGRAM, URETEROSCOPY  AND STENT PLACEMENT Left 04/15/2024   Procedure: CYSTOSCOPY WITH RETROGRADE PYELOGRAM;  Surgeon: Roseann Adine PARAS., MD;  Location: [facility];  Service: Urology;  Laterality: Left;   IR NEPHROSTOMY PLACEMENT LEFT  04/15/2024   LAPAROSCOPIC CHOLECYSTECTOMY  2002   [facility]   LAPAROSCOPIC HYSTERECTOMY  04/12/2011   Procedure: HYSTERECTOMY TOTAL LAPAROSCOPIC;  Surgeon: Vonn VEAR Inch, MD;   Location: [facility];  Service: Gynecology;  Laterality: N/A;   THYROIDECTOMY, PARTIAL  2012   [facility], [facility]    SH: Social History   Tobacco Use   Smoking status: Every Day    Current packs/day: 0.25    Average packs/day: 0.3 packs/day for 17.0 years (4.3 ttl pk-yrs)    Types: Cigarettes   Smokeless tobacco: Never  Vaping Use   Vaping status: Former  Substance Use Topics   Alcohol use: No   Drug use: No    ROS: Constitutional:  Negative for fever, chills, weight loss CV: Negative for chest pain, previous MI, hypertension Respiratory:  Negative for shortness of breath, wheezing, sleep apnea, frequent cough GI:  Negative for nausea, vomiting, bloody stool, GERD  PE: BP (!) 146/100   Pulse (!) 131   Ht 4' 10 (1.473 m)   Wt 184 lb (83.5 kg)   LMP 01/22/2011   BMI 38.46 kg/m  GENERAL APPEARANCE:  Well appearing, well developed, well nourished, NAD HEENT:  Atraumatic, normocephalic, oropharynx clear NECK:  Supple without lymphadenopathy or thyromegaly ABDOMEN:  Soft, non-tender, no masses EXTREMITIES:  Moves all extremities well, without clubbing, cyanosis, or edema NEUROLOGIC:  Alert and oriented x 3, normal gait, CN II-XII grossly intact MENTAL STATUS:  appropriate BACK:  Non-tender to palpation, No CVAT SKIN:  Warm, dry, and intact   Results: U/A: 6-10 WBCs, 11-30 RBCs, few bacteria

## 2024-05-13 NOTE — Telephone Encounter (Signed)
 Called patient,relayed results,placing order NFN

## 2024-05-14 ENCOUNTER — Encounter (HOSPITAL_COMMUNITY): Payer: Self-pay

## 2024-05-14 NOTE — Progress Notes (Signed)
 RE: Ureteral Stent? Received: Nilsa Sides, Ester PARAS, MD  Tommie Riggs Yes please cancel clinic appt, can schedule at [facility] for conversion of PCN to nephroureteral with sedation.  Thanks! Dylan  From: Tommie Riggs  Sent: 05/12/2024  12:15 PM EDT  To: Ir Procedure Requests  Subject: Ureteral Stent?                                Patient with left nephrostomy tube 04/15/24, office now requesting antegrade left ureteral stent placement.   Has follow up at [facility] (CT then fluoro) on 9/24, do I need to schedule this after this appointment or proceed with stent and cancel clinic?   Dr.Stoneking  [phone]   DX: left ureteral calculus

## 2024-05-19 ENCOUNTER — Other Ambulatory Visit: Payer: Self-pay | Admitting: Radiology

## 2024-05-19 DIAGNOSIS — N201 Calculus of ureter: Secondary | ICD-10-CM

## 2024-05-20 NOTE — H&P (Signed)
 Chief Complaint: Left ureteral calculus, status post left percutaneous nephrostomy on 04/15/2024; referred now for conversion of nephrostomy to ureteral stent  Referring Provider(s): Stoneking,B  Supervising Physician: Jenna Hacker  Patient Status: [facility] - Out-pt  History of Present Illness: Cassandra Lucas is a 48 y.o. female smoker with past medical history significant for anemia, anxiety, arthritis, asthma, carpal tunnel syndrome, COPD, diabetes, hyperlipidemia, fibromyalgia, vitamin D  deficiency and nephrolithiasis.  She has a history of large proximal left ureteral calculus with associated hydronephrosis and underwent left percutaneous nephrostomy on 04/15/2024 due to inability to place retrograde ureteral stent via cystoscopy.  She presents today for conversion of nephrostomy to antegrade ureteral stent.   Patient is Full Code  Past Medical History:  Diagnosis Date   Anemia    Anxiety    Arthritis    Asthma    Carpal tunnel syndrome, bilateral 11/22/2015   Will try wrist splints   Community acquired pneumonia    COPD (chronic obstructive pulmonary disease) (HCC)    Decreased libido 03/26/2015   Diabetes mellitus without complication (HCC)    prediabetes   Dyslipidemia 11/24/2015   Empty sella (HCC)    Fibromyalgia    Fibromyalgia    Hot flashes 03/26/2015   Hyperlipidemia    Kidney stones    Productive cough    smoker   Renal disorder    Vitamin D  deficiency 11/24/2015    Past Surgical History:  Procedure Laterality Date   ABDOMINAL HYSTERECTOMY     has ovaries.    BREAST CYST EXCISION     CESAREAN SECTION  2006   [facility]   CYSTOSCOPY WITH RETROGRADE PYELOGRAM, URETEROSCOPY AND STENT PLACEMENT Left 04/15/2024   Procedure: CYSTOSCOPY WITH RETROGRADE PYELOGRAM;  Surgeon: Roseann Adine PARAS., MD;  Location: [facility];  Service: Urology;  Laterality: Left;   IR NEPHROSTOMY PLACEMENT LEFT  04/15/2024   LAPAROSCOPIC CHOLECYSTECTOMY  2002   [facility]   LAPAROSCOPIC  HYSTERECTOMY  04/12/2011   Procedure: HYSTERECTOMY TOTAL LAPAROSCOPIC;  Surgeon: Vonn VEAR Inch, MD;  Location: [facility];  Service: Gynecology;  Laterality: N/A;   THYROIDECTOMY, PARTIAL  2012   [facility], [facility]    Allergies: Nsaids, Aspirin, Flagyl  [metronidazole ], and Macrobid [nitrofurantoin macrocrystal]  Medications: Prior to Admission medications   Medication Sig Start Date End Date Taking? Authorizing Provider  acetaminophen  (TYLENOL ) 500 MG tablet Take 1,000 mg by mouth daily as needed for mild pain.     [provider]  albuterol  (PROVENTIL ) (2.5 MG/3ML) 0.083% nebulizer solution Take 2.5 mg by nebulization every 6 (six) hours as needed for wheezing or shortness of breath.    [provider]  albuterol  (VENTOLIN  HFA) 108 (90 Base) MCG/ACT inhaler Inhale 1-2 puffs into the lungs every 6 (six) hours as needed for wheezing or shortness of breath. 07/31/21   Stuart Vernell Norris, PA-C  alendronate (FOSAMAX) 70 MG tablet Take 70 mg by mouth once a week. 09/15/22   [provider]  amoxicillin -clavulanate (AUGMENTIN ) 500-125 MG tablet Take 1 tablet by mouth every 8 (eight) hours. 04/27/24   Geroldine Berg, MD  BELBUCA  150 MCG FILM Take 150 mcg by mouth 2 (two) times daily. 12/07/23   [provider]  budeson-glycopyrrolate -formoterol (BREZTRI  AEROSPHERE) 160-9-4.8 MCG/ACT AERO Inhale 2 puffs into the lungs in the morning and at bedtime. Patient taking differently: Inhale 2 puffs into the lungs 2 (two) times daily as needed. 12/11/23   Neysa Rama D, MD  cetirizine  (ZYRTEC  ALLERGY) 10 MG tablet Take 1 tablet (10 mg  total) by mouth daily. 07/31/21   Stuart Vernell Norris, PA-C  cyclobenzaprine  (FLEXERIL ) 10 MG tablet Take 10 mg by mouth every evening. 07/22/21   [provider]  famotidine  (PEPCID ) 40 MG tablet Take 40 mg by mouth daily. 11/28/23   [provider]  fluticasone  (FLONASE ) 50 MCG/ACT nasal spray Place 1 spray into both nostrils 2  (two) times daily. Patient taking differently: Place 1 spray into both nostrils as needed for allergies. 07/31/21   Stuart Vernell Norris, PA-C  glipiZIDE (GLUCOTROL) 10 MG tablet Take 10 mg by mouth daily. Patient not taking: Reported on 05/12/2024 11/05/23   [provider]  hydroxychloroquine  (PLAQUENIL ) 200 MG tablet Take 400 mg by mouth daily.    [provider]  Ipratropium-Albuterol  (COMBIVENT RESPIMAT) 20-100 MCG/ACT AERS respimat Inhale 1 puff into the lungs every 6 (six) hours as needed for wheezing or shortness of breath. 10/23/18   [provider]  leflunomide  (ARAVA ) 10 MG tablet Take 1 tablet (10 mg total) by mouth daily. 04/22/24   Sebastian Toribio GAILS, MD  levalbuterol  (XOPENEX ) 0.63 MG/3ML nebulizer solution Take 3 mLs (0.63 mg total) by nebulization 3 (three) times daily for 3 days, THEN 3 mLs (0.63 mg total) every 6 (six) hours as needed. 04/17/24 04/20/25  Sebastian Toribio GAILS, MD  Multiple Vitamins-Minerals ([employer] ADULT MULTIVITAMIN) CHEW Chew 2 tablets by mouth daily.    [provider]  oxyCODONE -acetaminophen  (PERCOCET) 10-325 MG tablet Take 1 tablet by mouth 3 (three) times daily as needed. 04/04/24   [provider]  OZEMPIC, 2 MG/DOSE, 8 MG/3ML SOPN Inject 2 mg into the skin once a week. 11/13/23   [provider]  predniSONE  (DELTASONE ) 5 MG tablet Take 5 mg by mouth daily with breakfast.    [provider]  pregabalin  (LYRICA ) 200 MG capsule Take 200 mg by mouth 3 (three) times daily. 03/22/24   [provider]  promethazine -dextromethorphan (PROMETHAZINE -DM) 6.25-15 MG/5ML syrup Take 5 mLs by mouth 4 (four) times daily as needed. Patient not taking: Reported on 12/11/2023 11/28/23   Stuart Vernell Norris, PA-C  propafenone (RYTHMOL) 150 MG tablet Take 150 mg by mouth 3 (three) times daily. Patient not taking: Reported on 04/15/2024 03/17/24   [provider]  simvastatin (ZOCOR) 20 MG tablet Take 20 mg by mouth  every evening. 02/06/24   [provider]  tamsulosin  (FLOMAX ) 0.4 MG CAPS capsule Take 0.4 mg by mouth daily. Patient not taking: Reported on 05/12/2024 03/22/24   [provider]  Cimetidine (HEARTBURN RELIEF PO) Take 1 tablet by mouth daily as needed (heartburn).  05/31/20  [provider]     Family History  Problem Relation Age of Onset   Breast cancer Mother    Diabetes Father    Heart failure Father    Migraines Sister    Scoliosis Daughter    ADD / ADHD Son    Cancer Maternal Grandfather        pancreatic   Diabetes Paternal Grandmother    Congestive Heart Failure Paternal Grandmother    ADD / ADHD Daughter    Scoliosis Daughter    Hyperlipidemia Daughter    Diabetes Daughter        prediabetes   Bipolar disorder Son    ADD / ADHD Son     Social History   Socioeconomic History   Marital status: Legally Separated    Spouse name: Not on file   Number of children: 4   Years of education: 11th  grade   Highest education level: Not on file  Occupational History   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 Eats all food groups. Wear seatbelt.   Right-handed.   3 cans of soda per day.   Social Drivers of Corporate investment banker Strain: Not on file  Food Insecurity: No Food Insecurity (04/15/2024)   Hunger Vital Sign    Worried About Running Out of Food in the Last Year: Never true    Ran Out of Food in the Last Year: Never true  Transportation Needs: No  Transportation Needs (04/15/2024)   PRAPARE - Administrator, Civil Service (Medical): No    Lack of Transportation (Non-Medical): No  Physical Activity: Unknown (01/25/2018)   Exercise Vital Sign    Days of Exercise per Week: 3 days    Minutes of Exercise per Session: Not on file  Stress: Stress Concern Present (01/25/2018)   [employer] of Occupational Health - Occupational Stress Questionnaire    Feeling of Stress : Rather much  Social Connections: Unknown (03/07/2023)   Received from [facility]   Social Network    Social Network: Not on file       Review of Systems denies fever,HA,CP,abd pain, N/V or bleeding; she does have some urinary urgency, pelvic pressure, urine with strong odor, some dyspnea with exertion, occ cough  Vital Signs: Vitals:   05/21/24 1115  BP: (!) 131/96  Pulse: (!) 121  Resp: 18  Temp: 98.1 F (36.7 C)  SpO2: 99%    LMP 01/22/2011   Advance Care Plan: No documents on file  Physical Exam: awake/alert; chest- few scatt exp wheezes; heart- tachy but regular; abd-soft,+BS,NT; left PCN intact draining slightly hazy urine; no LE edema  Imaging: No results found.  Labs:  CBC: Recent Labs    04/14/24 2122 04/16/24 0419 04/17/24 0423 04/19/24 0052  WBC 8.0 12.1* 13.4* 11.9*  HGB 13.7 12.7 12.5 14.4  HCT 41.4 40.6 39.0 44.3  PLT 206 191 220 236    COAGS: Recent Labs    04/15/24 0835  INR 0.9    BMP: Recent Labs    04/14/24 2122 04/16/24 0419 04/17/24 0423 04/19/24 0052  NA 139 139 140 141  K 3.9 4.3 4.0 4.2  CL 100 108 109 105  CO2 30 23 23 26   GLUCOSE 111* 175* 264* 125*  BUN 13 22* 22* 23*  CALCIUM 9.7 8.9 8.6* 9.0  CREATININE 1.24* 1.13* 0.90 0.95  GFRNONAA 54* >60 >60 >60    LIVER FUNCTION TESTS: Recent Labs    04/14/24 2122 04/16/24 0419 04/19/24 0052  BILITOT 0.6 0.6 0.4  AST 15 15 16   ALT 16 13 18   ALKPHOS 70 61 66  PROT 6.5 6.0* 7.0  ALBUMIN 3.6 3.1* 3.5    TUMOR MARKERS: No  results for input(s): AFPTM, CEA, CA199, CHROMGRNA in the last 8760 hours.  Assessment and  Plan: 48 y.o. female smoker with past medical history significant for anemia, anxiety, arthritis, asthma, carpal tunnel syndrome, COPD, diabetes, hyperlipidemia, fibromyalgia, vitamin D  deficiency and nephrolithiasis.  She has a history of large proximal left ureteral calculus with associated hydronephrosis and underwent left percutaneous nephrostomy on 04/15/2024 due to inability to place retrograde ureteral stent via cystoscopy.  She presents today for conversion of nephrostomy to antegrade nephroureteral stent.Risks and benefits of left nephrostogram with ureteral stent placement was discussed with the patient including, but not limited to, infection, bleeding, significant bleeding causing loss or decrease in renal function or damage to adjacent structures.   All of the patient's questions were answered, patient is agreeable to proceed.  Consent signed and in chart.      Thank you for allowing our service to participate in Cassandra Lucas 's care.  Electronically Signed: D. Franky Rakers, PA-C   05/20/2024, 1:34 PM      I spent a total of  20 minutes   in face to face in clinical consultation, greater than 50% of which was counseling/coordinating care for left nephrostogram with conversion of nephrostomy tube to ureteral stent

## 2024-05-21 ENCOUNTER — Ambulatory Visit (HOSPITAL_COMMUNITY)
Admission: RE | Admit: 2024-05-21 | Discharge: 2024-05-21 | Disposition: A | Discharge status: Home / Self Care | Source: Ambulatory Visit | Attending: Urology | Admitting: Urology

## 2024-05-21 ENCOUNTER — Encounter (HOSPITAL_COMMUNITY): Payer: Self-pay

## 2024-05-21 ENCOUNTER — Other Ambulatory Visit: Payer: Self-pay

## 2024-05-21 DIAGNOSIS — N201 Calculus of ureter: Secondary | ICD-10-CM

## 2024-05-21 DIAGNOSIS — F17210 Nicotine dependence, cigarettes, uncomplicated: Secondary | ICD-10-CM | POA: Diagnosis not present

## 2024-05-21 HISTORY — PX: IR URETERAL STENT PLACEMENT EXISTING ACCESS LEFT: IMG6073

## 2024-05-21 LAB — CBC WITH DIFFERENTIAL/PLATELET
Abs Immature Granulocytes: 0.14 K/uL — ABNORMAL HIGH (ref 0.00–0.07)
Basophils Absolute: 0.1 K/uL (ref 0.0–0.1)
Basophils Relative: 1 %
Eosinophils Absolute: 0.2 K/uL (ref 0.0–0.5)
Eosinophils Relative: 1 %
HCT: 41.8 % (ref 36.0–46.0)
Hemoglobin: 13.7 g/dL (ref 12.0–15.0)
Immature Granulocytes: 1 %
Lymphocytes Relative: 23 %
Lymphs Abs: 3.6 K/uL (ref 0.7–4.0)
MCH: 30.6 pg (ref 26.0–34.0)
MCHC: 32.8 g/dL (ref 30.0–36.0)
MCV: 93.5 fL (ref 80.0–100.0)
Monocytes Absolute: 1.1 K/uL — ABNORMAL HIGH (ref 0.1–1.0)
Monocytes Relative: 7 %
Neutro Abs: 10.7 K/uL — ABNORMAL HIGH (ref 1.7–7.7)
Neutrophils Relative %: 67 %
Platelets: 231 K/uL (ref 150–400)
RBC: 4.47 MIL/uL (ref 3.87–5.11)
RDW: 13.2 % (ref 11.5–15.5)
WBC: 15.8 K/uL — ABNORMAL HIGH (ref 4.0–10.5)
nRBC: 0 % (ref 0.0–0.2)

## 2024-05-21 LAB — BASIC METABOLIC PANEL WITH GFR
Anion gap: 12 (ref 5–15)
BUN: 10 mg/dL (ref 6–20)
CO2: 24 mmol/L (ref 22–32)
Calcium: 9.7 mg/dL (ref 8.9–10.3)
Chloride: 104 mmol/L (ref 98–111)
Creatinine, Ser: 0.83 mg/dL (ref 0.44–1.00)
GFR, Estimated: >60 mL/min (ref >60)
Glucose, Bld: 86 mg/dL (ref 70–99)
Potassium: 3.9 mmol/L (ref 3.5–5.1)
Sodium: 141 mmol/L (ref 135–145)

## 2024-05-21 LAB — PROTIME-INR
INR: 0.9 (ref 0.8–1.2)
Prothrombin Time: 12.1 s (ref 11.4–15.2)

## 2024-05-21 LAB — GLUCOSE, CAPILLARY: Glucose-Capillary: 95 mg/dL (ref 70–99)

## 2024-05-21 MED ORDER — DIPHENHYDRAMINE HCL 50 MG/ML IJ SOLN
INTRAMUSCULAR | Status: AC
  Filled 2024-05-21: qty 1

## 2024-05-21 MED ORDER — LIDOCAINE HCL 1 % IJ SOLN: 20.0000 mL | Freq: Once | INTRAMUSCULAR | Status: DC

## 2024-05-21 MED ORDER — MIDAZOLAM HCL 2 MG/2ML IJ SOLN
INTRAMUSCULAR | Status: AC | PRN
  Administered 2024-05-21 (×2): 1 mg via INTRAVENOUS

## 2024-05-21 MED ORDER — MIDAZOLAM HCL 2 MG/2ML IJ SOLN
INTRAMUSCULAR | Status: AC
  Filled 2024-05-21: qty 2

## 2024-05-21 MED ORDER — SODIUM CHLORIDE 0.9% FLUSH: 5.0000 mL | Freq: Three times a day (TID) | INTRAVENOUS | Status: DC

## 2024-05-21 MED ORDER — DIPHENHYDRAMINE HCL 50 MG/ML IJ SOLN
INTRAMUSCULAR | Status: AC | PRN
  Administered 2024-05-21: 50 mg via INTRAVENOUS

## 2024-05-21 MED ORDER — HYDROMORPHONE HCL 1 MG/ML IJ SOLN
INTRAMUSCULAR | Status: AC
Start: 2024-05-21 — End: 2024-05-21
  Filled 2024-05-21: qty 1

## 2024-05-21 MED ORDER — FENTANYL CITRATE (PF) 100 MCG/2ML IJ SOLN
INTRAMUSCULAR | Status: AC | PRN
  Administered 2024-05-21: 50 ug via INTRAVENOUS

## 2024-05-21 MED ORDER — ONDANSETRON HCL 4 MG/2ML IJ SOLN
INTRAMUSCULAR | Status: AC | PRN
  Administered 2024-05-21: 4 mg via INTRAVENOUS

## 2024-05-21 MED ORDER — IOHEXOL 300 MG/ML  SOLN
50.0000 mL | Freq: Once | INTRAMUSCULAR | Status: AC | PRN
  Administered 2024-05-21: 20 mL

## 2024-05-21 MED ORDER — HYDROMORPHONE HCL 1 MG/ML IJ SOLN
INTRAMUSCULAR | Status: AC | PRN
  Administered 2024-05-21: 1 mg via INTRAVENOUS

## 2024-05-21 MED ORDER — FENTANYL CITRATE (PF) 100 MCG/2ML IJ SOLN
INTRAMUSCULAR | Status: AC
  Filled 2024-05-21: qty 2

## 2024-05-21 MED ORDER — ONDANSETRON HCL 4 MG/2ML IJ SOLN
INTRAMUSCULAR | Status: AC
Start: 2024-05-21 — End: 2024-05-21
  Filled 2024-05-21: qty 2

## 2024-05-21 MED ORDER — LIDOCAINE HCL 1 % IJ SOLN
INTRAMUSCULAR | Status: AC
Start: 2024-05-21 — End: 2024-05-21
  Filled 2024-05-21: qty 20

## 2024-05-21 MED ORDER — SODIUM CHLORIDE 0.9 % IV SOLN: INTRAVENOUS | Status: DC

## 2024-05-21 MED ORDER — SODIUM CHLORIDE 0.9 % IV SOLN
2.0000 g | Freq: Once | INTRAVENOUS | Status: AC
  Administered 2024-05-21: 2 g via INTRAVENOUS
  Filled 2024-05-21 (×2): qty 20

## 2024-05-21 NOTE — Discharge Instructions (Signed)
 Moderate Conscious Sedation-Care After  This sheet gives you information about how to care for yourself after your procedure. Your health care provider may also give you more specific instructions. If you have problems or questions, contact your health care provider.  After the procedure, it is common to have: Sleepiness for several hours. Impaired judgment for several hours. Difficulty with balance. Vomiting if you eat too soon.  Follow these instructions at home:  Rest. Do not participate in activities where you could fall or become injured. Do not drive or use machinery. Do not drink alcohol. Do not take sleeping pills or medicines that cause drowsiness. Do not make important decisions or sign legal documents. Do not take care of children on your own.  Eating and drinking Follow the diet recommended by your health care provider. Drink enough fluid to keep your urine pale yellow. If you vomit: Drink water, juice, or soup when you can drink without vomiting. Make sure you have little or no nausea before eating solid foods.  General instructions Take over-the-counter and prescription medicines only as told by your health care provider. Have a responsible adult stay with you for the time you are told. It is important to have someone help care for you until you are awake and alert. Do not smoke. Keep all follow-up visits as told by your health care provider. This is important.  Contact a health care provider if: You are still sleepy or having trouble with balance after 24 hours. You feel light-headed. You keep feeling nauseous or you keep vomiting. You develop a rash. You have a fever. You have redness or swelling around the IV site.  Get help right away if: You have trouble breathing. You have new-onset confusion at home.  This information is not intended to replace advice given to you by your health care provider. Make sure you discuss any questions you have with your  healthcare provider.  Comments  #1 internal double J ureteral stent.  No drainage bag.

## 2024-05-21 NOTE — Sedation Documentation (Signed)
 RN Jacquez Sheetz pulled 2 mg Versed  and 100 mcg Fentanyl  in Ir room. Pt. Received 2 mg Versed  and 100 mcg Fentanyl  throughout the procedure.

## 2024-05-21 NOTE — Progress Notes (Signed)
 Call to IR to discuss aftercare for patient post stent placement. Per IR, patient to have follow up with urology. No Rx for home ABX given by IR.  Patient aware to ensure follow up scheduled. Call to [facility] - no answer, LVM to call patient back on her cell phone.  Patient aware if she does not hear back by 16:00 this afternoon to attempt to call again as the office closes at 17:00. All other discharge instructions reviewed and patient verbalized understanding.

## 2024-05-21 NOTE — Procedures (Signed)
 Interventional Radiology Procedure Note  Procedure: Removal of PCN and placement of double J internal ureteral stent  Complications: None  Estimated Blood Loss: < 10 mL  Findings: 8Fr double J placed after removal of left PCN.  Cordella DELENA Banner, MD

## 2024-05-27 ENCOUNTER — Telehealth: Payer: Self-pay | Admitting: Urology

## 2024-05-27 ENCOUNTER — Telehealth: Payer: Self-pay | Admitting: Internal Medicine

## 2024-05-27 NOTE — Telephone Encounter (Signed)
 Copied from CRM [id number]. Topic: Clinical - Order For Equipment >> May 27, 2024  2:58 PM Russell PARAS wrote: Reason for CRM:   Pt contacting clinic regarding order for CPAP. Advised order was sent to [facility] on 8/28 and provided # to contact for scheduling delivery. NFN >> May 27, 2024  3:10 PM Essie A wrote: Patient is calling because she was given [facility]'s phone number to call, in which she did call them.  They told her they never received the order for the CPAP machine.  Please resend order to [facility] and let patient know when this has been done. Her phone number is [phone].  Thanks.

## 2024-05-27 NOTE — Telephone Encounter (Signed)
 PCC's, Can you look into this. I checked the pt's CPAP order and [facility] stated they did receive it.

## 2024-05-27 NOTE — Telephone Encounter (Signed)
-----   Message from Adine Manly sent at 05/21/2024  5:17 PM EDT ----- Please schedule patient for f/u appt in the next 2-3 weeks

## 2024-05-27 NOTE — Telephone Encounter (Signed)
 I have sent a message to [facility] to check status

## 2024-05-27 NOTE — Telephone Encounter (Signed)
 Was unable to LVM on pt phone. Sent mychart message relaying information and stated to call or respond to message if patient wants to schedule.

## 2024-05-28 NOTE — Telephone Encounter (Signed)
 Per Cassandra Lucas at [facility]- I sent this order in 05-16-2024 I sent in again yesterday to intake stat as  Ihad not seen that is has been processed. intake took the order @ 321pm to review and process. They are still working on it. Tried to call pt but no VM has been set up.

## 2024-06-03 NOTE — Telephone Encounter (Signed)
 Please follow up

## 2024-06-03 NOTE — Telephone Encounter (Signed)
 I have sent Cassandra Lucas a message in regards to the status

## 2024-06-03 NOTE — Telephone Encounter (Signed)
 Patient is calling because she still has not received her CPAP machine and supplies.  She said she called [facility] on 05/27/24 and was told they still haven't received the order.  She will call again to see if they are still saying they have not received the order.  Please call patient to let her know the status from the office's end at [phone].  Thanks.

## 2024-06-04 NOTE — Telephone Encounter (Signed)
 Per Arvella at [facility]- The order is pending auth from the insurance as of 06/03/2024. Once received the patient will be contacted for scheduling. We also spoke to patient this morning.

## 2024-06-10 ENCOUNTER — Ambulatory Visit: Admitting: Urology

## 2024-06-10 ENCOUNTER — Encounter: Payer: Self-pay | Admitting: Urology

## 2024-06-10 ENCOUNTER — Ambulatory Visit: Payer: Self-pay | Admitting: Urology

## 2024-06-10 VITALS — BP 130/90 | HR 132 | Ht <= 58 in | Wt 181.0 lb

## 2024-06-10 DIAGNOSIS — N201 Calculus of ureter: Secondary | ICD-10-CM | POA: Diagnosis not present

## 2024-06-10 DIAGNOSIS — R8290 Unspecified abnormal findings in urine: Secondary | ICD-10-CM

## 2024-06-10 LAB — URINALYSIS, ROUTINE W REFLEX MICROSCOPIC
Bilirubin, UA: NEGATIVE
Glucose, UA: NEGATIVE
Nitrite, UA: POSITIVE — AB
Specific Gravity, UA: >=1.03 — AB (ref 1.005–1.030)
Urobilinogen, Ur: 0.2 mg/dL (ref 0.2–1.0)
pH, UA: 6 (ref 5.0–7.5)

## 2024-06-10 LAB — MICROSCOPIC EXAMINATION
RBC, Urine: >30 /HPF — AB (ref 0–2)
WBC, UA: >30 /HPF — AB (ref 0–5)

## 2024-06-10 MED ORDER — CEFTRIAXONE SODIUM 1 G IJ SOLR
1.0000 g | Freq: Once | INTRAMUSCULAR | Status: AC
  Administered 2024-06-10: 1 g via INTRAMUSCULAR

## 2024-06-10 NOTE — Progress Notes (Signed)
 Assessment: 1. Ureterolithiasis, left, proximal   2. Abnormal urine findings     Plan: I discussed recommendations for continued management of the large proximal left ureteral calculus. I discussed options including shockwave lithotripsy and ureteroscopic laser lithotripsy. Following our discussion, she elects to proceed with ureteroscopic laser lithotripsy.  Risks, benefits, alternatives were discussed with the patient in detail.  Potential risks including, but not limited to, infection; bleeding;  injury to urethra, bladder, or ureter; possible need of other treatments; possible failure to remove the calculus; ureteral  stricture formation; cardiac, pulmonary, cerebrovascular events; and anesthetic complications were discussed.  The patient understands and wishes to proceed.  I advised her that we will need to treat any UTI prior to any intervention for her stone. Urine culture sent today. Rocephin  1 g IM given today.  Procedure: The patient will be scheduled for cystoscopy, left ureteroscopic laser lithotripsy, exchange of left ureteral stent at [facility].  Surgical request is placed with the surgery schedulers and will be scheduled at the patient's/family request. Informed consent is given as documented below. Anesthesia: General  The patient does not have sleep apnea, history of MRSA, history of VRE, history of cardiac device requiring special anesthetic needs. Patient is stable and considered clear for surgical management in an outpatient ambulatory surgery setting as well as inpatient hospital setting.  Consent for Operation or Procedure: Provider Certification I hereby certify that the nature, purpose, benefits, usual and most frequent risks of, and alternatives to, the operation or procedure have been explained to the patient (or person authorized to sign for the patient) either by me as responsible physician or by the provider who is to perform the operation or procedure. Time  spent such that the patient/family has had an opportunity to ask questions, and that those questions have been answered. The patient or the patient's representative has been advised that selected tasks may be performed by assistants to the primary health care provider(s). I believe that the patient (or person authorized to sign for the patient) understands what has been explained, and has consented to the operation or procedure. No guarantees were implied or made.   Chief Complaint: Chief Complaint  Patient presents with   Nephrolithiasis    HPI: Cassandra Lucas is a 48 y.o. female who presents for continued evaluation of left ureteral calculus. She was seen as a hospital consult on 04/15/2024.  She reported a long history of nephrolithiasis passing the majority of her stone spontaneously.  She presented to the emergency room on 04/14/2024 with a 3-week history of left-sided flank pain.  She had some associated nausea without vomiting.  No fevers or chills.  Creatinine 1.24. CT renal stone study showed a 9 x 11 x 14 mm stone in the left proximal ureter with associated left hydronephrosis. Urinalysis showed >50 RBCs, >50 WBCs, rare bacteria. She was taken to the operating room on 04/15/2024 for cystoscopy and left ureteral stent placement.  I was unable to pass a guidewire beyond the proximal left ureteral calculus. She subsequently underwent insertion of a left nephrostomy tube by interventional radiology on 04/15/2024. Urine culture grew <10K colonies. She presented back to the emergency room on 04/19/2024 with flank pain. CT imaging showed decreased left hydronephrosis with a 12 mm stone in the proximal left ureter.  At her visit on 05/12/2024, she continued with the left nephrostomy tube.  She had some intermittent discomfort in the flank area associated with the nephrostomy tube.  Her urine remained fairly clear.  She had output between [phone] mL/day from the left kidney.  No fevers or chills.   She completed a course of Bactrim  for a UTI.  No culture results available. She underwent placement of an 8.5 [place] by 26 cm double-J stent on the left by interventional radiology on 05/21/2024. Creatinine from 05/21/2024: 0.83  She returns today for follow-up.  She is having significant lower urinary tract symptoms with frequency, urgency, dysuria, and gross hematuria.  She feels like she has a UTI.  She is having some intermittent left-sided flank pain.  No fever or chills.  No nausea or vomiting.  She is not currently on an antibiotic.  Portions of the above documentation were copied from a prior visit for review purposes only.  Allergies: Allergies  Allergen Reactions   Nsaids Shortness Of Breath   Aspirin Other (See Comments)    Unknown childhood reaction   Flagyl  [Metronidazole ]    Macrobid [Nitrofurantoin Macrocrystal] Nausea And Vomiting    PMH: Past Medical History:  Diagnosis Date   Anemia    Anxiety    Arthritis    Asthma    Carpal tunnel syndrome, bilateral 11/22/2015   Will try wrist splints   Community acquired pneumonia    COPD (chronic obstructive pulmonary disease) (HCC)    Decreased libido 03/26/2015   Diabetes mellitus without complication (HCC)    prediabetes   Dyslipidemia 11/24/2015   Empty sella    Fibromyalgia    Fibromyalgia    Hot flashes 03/26/2015   Hyperlipidemia    Kidney stones    Productive cough    smoker   Renal disorder    Vitamin D  deficiency 11/24/2015    PSH: Past Surgical History:  Procedure Laterality Date   ABDOMINAL HYSTERECTOMY     has ovaries.    BREAST CYST EXCISION     CESAREAN SECTION  2006   [facility]   CYSTOSCOPY WITH RETROGRADE PYELOGRAM, URETEROSCOPY AND STENT PLACEMENT Left 04/15/2024   Procedure: CYSTOSCOPY WITH RETROGRADE PYELOGRAM;  Surgeon: Roseann Adine PARAS., MD;  Location: [facility];  Service: Urology;  Laterality: Left;   IR NEPHROSTOMY PLACEMENT LEFT  04/15/2024   IR URETERAL STENT PLACEMENT EXISTING ACCESS LEFT   05/21/2024   LAPAROSCOPIC CHOLECYSTECTOMY  2002   [facility]   LAPAROSCOPIC HYSTERECTOMY  04/12/2011   Procedure: HYSTERECTOMY TOTAL LAPAROSCOPIC;  Surgeon: Vonn VEAR Inch, MD;  Location: [facility];  Service: Gynecology;  Laterality: N/A;   THYROIDECTOMY, PARTIAL  2012   [facility], [facility]    SH: Social History   Tobacco Use   Smoking status: Every Day    Current packs/day: 0.25    Average packs/day: 0.3 packs/day for 17.0 years (4.3 ttl pk-yrs)    Types: Cigarettes   Smokeless tobacco: Never  Vaping Use   Vaping status: Former  Substance Use Topics   Alcohol use: No   Drug use: No    ROS: Constitutional:  Negative for fever, chills, weight loss CV: Negative for chest pain, previous MI, hypertension Respiratory:  Negative for shortness of breath, wheezing, sleep apnea, frequent cough GI:  Negative for nausea, vomiting, bloody stool, GERD  PE: BP (!) 130/90   Pulse (!) 132   Ht 4' 10 (1.473 m)   Wt 181 lb (82.1 kg)   LMP 01/22/2011   BMI 37.83 kg/m  GENERAL APPEARANCE:  Well appearing, well developed, well nourished, NAD HEENT:  Atraumatic, normocephalic, oropharynx clear NECK:  Supple without lymphadenopathy or thyromegaly ABDOMEN:  Soft, non-tender, no masses EXTREMITIES:  Moves all  extremities well, without clubbing, cyanosis, or edema NEUROLOGIC:  Alert and oriented x 3, normal gait, CN II-XII grossly intact MENTAL STATUS:  appropriate BACK:  Non-tender to palpation, No CVAT SKIN:  Warm, dry, and intact   Results: U/A: >30 WBCs, >30 RBCs, many bacteria, nitrite positive

## 2024-06-10 NOTE — H&P (View-Only) (Signed)
 Assessment: 1. Ureterolithiasis, left, proximal   2. Abnormal urine findings     Plan: I discussed recommendations for continued management of the large proximal left ureteral calculus. I discussed options including shockwave lithotripsy and ureteroscopic laser lithotripsy. Following our discussion, she elects to proceed with ureteroscopic laser lithotripsy.  Risks, benefits, alternatives were discussed with the patient in detail.  Potential risks including, but not limited to, infection; bleeding;  injury to urethra, bladder, or ureter; possible need of other treatments; possible failure to remove the calculus; ureteral  stricture formation; cardiac, pulmonary, cerebrovascular events; and anesthetic complications were discussed.  The patient understands and wishes to proceed.  I advised her that we will need to treat any UTI prior to any intervention for her stone. Urine culture sent today. Rocephin  1 g IM given today.  Procedure: The patient will be scheduled for cystoscopy, left ureteroscopic laser lithotripsy, exchange of left ureteral stent at [facility].  Surgical request is placed with the surgery schedulers and will be scheduled at the patient's/family request. Informed consent is given as documented below. Anesthesia: General  The patient does not have sleep apnea, history of MRSA, history of VRE, history of cardiac device requiring special anesthetic needs. Patient is stable and considered clear for surgical management in an outpatient ambulatory surgery setting as well as inpatient hospital setting.  Consent for Operation or Procedure: Provider Certification I hereby certify that the nature, purpose, benefits, usual and most frequent risks of, and alternatives to, the operation or procedure have been explained to the patient (or person authorized to sign for the patient) either by me as responsible physician or by the provider who is to perform the operation or procedure. Time  spent such that the patient/family has had an opportunity to ask questions, and that those questions have been answered. The patient or the patient's representative has been advised that selected tasks may be performed by assistants to the primary health care provider(s). I believe that the patient (or person authorized to sign for the patient) understands what has been explained, and has consented to the operation or procedure. No guarantees were implied or made.   Chief Complaint: Chief Complaint  Patient presents with   Nephrolithiasis    HPI: Cassandra Lucas is a 48 y.o. female who presents for continued evaluation of left ureteral calculus. She was seen as a hospital consult on 04/15/2024.  She reported a long history of nephrolithiasis passing the majority of her stone spontaneously.  She presented to the emergency room on 04/14/2024 with a 3-week history of left-sided flank pain.  She had some associated nausea without vomiting.  No fevers or chills.  Creatinine 1.24. CT renal stone study showed a 9 x 11 x 14 mm stone in the left proximal ureter with associated left hydronephrosis. Urinalysis showed >50 RBCs, >50 WBCs, rare bacteria. She was taken to the operating room on 04/15/2024 for cystoscopy and left ureteral stent placement.  I was unable to pass a guidewire beyond the proximal left ureteral calculus. She subsequently underwent insertion of a left nephrostomy tube by interventional radiology on 04/15/2024. Urine culture grew <10K colonies. She presented back to the emergency room on 04/19/2024 with flank pain. CT imaging showed decreased left hydronephrosis with a 12 mm stone in the proximal left ureter.  At her visit on 05/12/2024, she continued with the left nephrostomy tube.  She had some intermittent discomfort in the flank area associated with the nephrostomy tube.  Her urine remained fairly clear.  She had output between [phone] mL/day from the left kidney.  No fevers or chills.   She completed a course of Bactrim  for a UTI.  No culture results available. She underwent placement of an 8.5 [place] by 26 cm double-J stent on the left by interventional radiology on 05/21/2024. Creatinine from 05/21/2024: 0.83  She returns today for follow-up.  She is having significant lower urinary tract symptoms with frequency, urgency, dysuria, and gross hematuria.  She feels like she has a UTI.  She is having some intermittent left-sided flank pain.  No fever or chills.  No nausea or vomiting.  She is not currently on an antibiotic.  Portions of the above documentation were copied from a prior visit for review purposes only.  Allergies: Allergies  Allergen Reactions   Nsaids Shortness Of Breath   Aspirin Other (See Comments)    Unknown childhood reaction   Flagyl  [Metronidazole ]    Macrobid [Nitrofurantoin Macrocrystal] Nausea And Vomiting    PMH: Past Medical History:  Diagnosis Date   Anemia    Anxiety    Arthritis    Asthma    Carpal tunnel syndrome, bilateral 11/22/2015   Will try wrist splints   Community acquired pneumonia    COPD (chronic obstructive pulmonary disease) (HCC)    Decreased libido 03/26/2015   Diabetes mellitus without complication (HCC)    prediabetes   Dyslipidemia 11/24/2015   Empty sella    Fibromyalgia    Fibromyalgia    Hot flashes 03/26/2015   Hyperlipidemia    Kidney stones    Productive cough    smoker   Renal disorder    Vitamin D  deficiency 11/24/2015    PSH: Past Surgical History:  Procedure Laterality Date   ABDOMINAL HYSTERECTOMY     has ovaries.    BREAST CYST EXCISION     CESAREAN SECTION  2006   [facility]   CYSTOSCOPY WITH RETROGRADE PYELOGRAM, URETEROSCOPY AND STENT PLACEMENT Left 04/15/2024   Procedure: CYSTOSCOPY WITH RETROGRADE PYELOGRAM;  Surgeon: Roseann Adine PARAS., MD;  Location: [facility];  Service: Urology;  Laterality: Left;   IR NEPHROSTOMY PLACEMENT LEFT  04/15/2024   IR URETERAL STENT PLACEMENT EXISTING ACCESS LEFT   05/21/2024   LAPAROSCOPIC CHOLECYSTECTOMY  2002   [facility]   LAPAROSCOPIC HYSTERECTOMY  04/12/2011   Procedure: HYSTERECTOMY TOTAL LAPAROSCOPIC;  Surgeon: Vonn VEAR Inch, MD;  Location: [facility];  Service: Gynecology;  Laterality: N/A;   THYROIDECTOMY, PARTIAL  2012   [facility], [facility]    SH: Social History   Tobacco Use   Smoking status: Every Day    Current packs/day: 0.25    Average packs/day: 0.3 packs/day for 17.0 years (4.3 ttl pk-yrs)    Types: Cigarettes   Smokeless tobacco: Never  Vaping Use   Vaping status: Former  Substance Use Topics   Alcohol use: No   Drug use: No    ROS: Constitutional:  Negative for fever, chills, weight loss CV: Negative for chest pain, previous MI, hypertension Respiratory:  Negative for shortness of breath, wheezing, sleep apnea, frequent cough GI:  Negative for nausea, vomiting, bloody stool, GERD  PE: BP (!) 130/90   Pulse (!) 132   Ht 4' 10 (1.473 m)   Wt 181 lb (82.1 kg)   LMP 01/22/2011   BMI 37.83 kg/m  GENERAL APPEARANCE:  Well appearing, well developed, well nourished, NAD HEENT:  Atraumatic, normocephalic, oropharynx clear NECK:  Supple without lymphadenopathy or thyromegaly ABDOMEN:  Soft, non-tender, no masses EXTREMITIES:  Moves all  extremities well, without clubbing, cyanosis, or edema NEUROLOGIC:  Alert and oriented x 3, normal gait, CN II-XII grossly intact MENTAL STATUS:  appropriate BACK:  Non-tender to palpation, No CVAT SKIN:  Warm, dry, and intact   Results: U/A: >30 WBCs, >30 RBCs, many bacteria, nitrite positive

## 2024-06-11 ENCOUNTER — Other Ambulatory Visit

## 2024-06-13 ENCOUNTER — Telehealth: Payer: Self-pay

## 2024-06-13 NOTE — Telephone Encounter (Signed)
 Called to speak with pt in reference to surgery. Pt stated that she is having an increase in dysuria, bladder pain, urgency, and frequency. Pt states she is taking AZO without relief. I do see that a ucx was sent but no final results yet. Please advise.

## 2024-06-16 ENCOUNTER — Other Ambulatory Visit: Payer: Self-pay | Admitting: Urology

## 2024-06-16 DIAGNOSIS — R8290 Unspecified abnormal findings in urine: Secondary | ICD-10-CM

## 2024-06-16 MED ORDER — SULFAMETHOXAZOLE-TRIMETHOPRIM 800-160 MG PO TABS: 1.0000 | ORAL_TABLET | Freq: Two times a day (BID) | ORAL | 0 refills | Status: DC

## 2024-06-16 NOTE — Telephone Encounter (Signed)
 Attempted to contact pt. No vm set up. Will try again later.

## 2024-06-17 ENCOUNTER — Ambulatory Visit: Payer: Self-pay | Admitting: Urology

## 2024-06-17 DIAGNOSIS — R8290 Unspecified abnormal findings in urine: Secondary | ICD-10-CM

## 2024-06-17 LAB — URINE CULTURE

## 2024-06-17 MED ORDER — LEVOFLOXACIN 500 MG PO TABS: 500.0000 mg | ORAL_TABLET | Freq: Every day | ORAL | 0 refills | Status: DC

## 2024-06-17 NOTE — Progress Notes (Signed)
 COVID Vaccine received:  [x]  No []  Yes Date of any COVID positive Test in last 90 days: no PCP - [facility] [place] Cardiologist - Dr. Jeronimo @ [facility]  Chest x-ray - 04/16/24 Epic EKG -  04/19/24 Epic Stress Test -  ECHO -  Cardiac Cath -   Bowel Prep - [x]  No  []   Yes ______  Pacemaker / ICD device [x]  No []  Yes   Spinal Cord Stimulator:[x]  No []  Yes       History of Sleep Apnea? []  No [x]  Yes   CPAP used?- []  No [x]  Yes    Does the patient monitor blood sugar?          []  No [x]  Yes  []  N/A  Patient has: []  NO Hx DM   []  Pre-DM                 []  DM1  [x]   DM2 Does patient have a [employer] or Dexacom? [x]  No []  Yes   Fasting Blood Sugar Ranges- 116 Checks Blood Sugar __1__ times a week  GLP1 agonist / usual dose - Ozempic last dose 06/13/24 GLP1 instructions:  SGLT-2 inhibitors / usual dose -  SGLT-2 instructions:   Blood Thinner / Instructions:no Aspirin Instructions:no  Comments:   Activity level: Patient is able  to climb a flight of stairs without difficulty; [x]  No CP  [x]  No SOB,   Patient can  perform ADLs without assistance.   Anesthesia review:   Patient denies shortness of breath, fever, cough and chest pain at PAT appointment.  Patient verbalized understanding and agreement to the Pre-Surgical Instructions that were given to them at this PAT appointment. Patient was also educated of the need to review these PAT instructions again prior to his/her surgery.I reviewed the appropriate phone numbers to call if they have any and questions or concerns.

## 2024-06-17 NOTE — Patient Instructions (Signed)
 SURGICAL WAITING ROOM VISITATION  Patients having surgery or a procedure may have no more than 2 support people in the waiting area - these visitors may rotate.    Children under the age of 48 must have an adult with them who is not the patient.  Visitors with respiratory illnesses are discouraged from visiting and should remain at home.  If the patient needs to stay at the hospital during part of their recovery, the visitor guidelines for inpatient rooms apply. Pre-op nurse will coordinate an appropriate time for 1 support person to accompany patient in pre-op.  This support person may not rotate.    Please refer to the [facility] website for the visitor guidelines for Inpatients (after your surgery is over and you are in a regular room).       Your procedure is scheduled on: 06/24/24   Report to [facility] Main Entrance    Report to admitting at 1:15 PM   Call this number if you have problems the morning of surgery [phone]   Do not eat food :After Midnight.   After Midnight you may have the following liquids until 9:30 AM DAY OF SURGERY  Water Non-Citrus Juices (without pulp, NO RED-Apple, White grape, White cranberry) Black Coffee (NO MILK/CREAM OR CREAMERS, sugar ok)  Clear Tea (NO MILK/CREAM OR CREAMERS, sugar ok) regular and decaf                             Plain Jell-O (NO RED)                                           Fruit ices (not with fruit pulp, NO RED)                                     Popsicles (NO RED)                                                               Sports drinks like Gatorade (NO RED)                  Oral Hygiene is also important to reduce your risk of infection.                                    Remember - BRUSH YOUR TEETH THE MORNING OF SURGERY WITH YOUR REGULAR TOOTHPASTE   Do NOT smoke after Midnight   Stop all vitamins and herbal supplements 7 days before surgery.   Take these medicines the morning of surgery with A  SIP OF WATER: Tylenol  if needed, inhaler, Zyrtec (Cetirizine ), famotidine (pepcid ), nasal spray, hydroxychloroquine (Plaquenil ), Leflunomide (Arava ), oxycodone , prednisone , pregabalin (lyrica ), Simvastatin.  DO NOT TAKE ANY ORAL DIABETIC MEDICATIONS DAY OF YOUR SURGERY  Hold Ozempic at least 7 days prior to surgery. Last dose to be     Bring CPAP mask and tubing day of surgery.  You may not have any metal on your body including hair pins, jewelry, and body piercing             Do not wear make-up, lotions, powders, perfumes/cologne, or deodorant  Do not wear nail polish including gel and S&S, artificial/acrylic nails, or any other type of covering on natural nails including finger and toenails. If you have artificial nails, gel coating, etc. that needs to be removed by a nail salon please have this removed prior to surgery or surgery may need to be canceled/ delayed if the surgeon/ anesthesia feels like they are unable to be safely monitored.   Do not shave  48 hours prior to surgery.    Do not bring valuables to the hospital. [facility] IS NOT             RESPONSIBLE   FOR VALUABLES.   Contacts, glasses, dentures or bridgework may not be worn into surgery.  DO NOT BRING YOUR HOME MEDICATIONS TO THE HOSPITAL. PHARMACY WILL DISPENSE MEDICATIONS LISTED ON YOUR MEDICATION LIST TO YOU DURING YOUR ADMISSION IN THE HOSPITAL!    Patients discharged on the day of surgery will not be allowed to drive home.  Someone NEEDS to stay with you for the first 24 hours after anesthesia.   Special Instructions: Bring a copy of your healthcare power of attorney and living will documents the day of surgery if you haven't scanned them before.              Please read over the following fact sheets you were given: IF YOU HAVE QUESTIONS ABOUT YOUR PRE-OP INSTRUCTIONS PLEASE CALL [phone] [place]   If you received a COVID test during your pre-op visit  it is requested that you wear a  mask when out in public, stay away from anyone that may not be feeling well and notify your surgeon if you develop symptoms. If you test positive for Covid or have been in contact with anyone that has tested positive in the last 10 days please notify you surgeon.    [place] - Preparing for Surgery Before surgery, you can play an important role.  Because skin is not sterile, your skin needs to be as free of germs as possible.  You can reduce the number of germs on your skin by washing with CHG (chlorahexidine gluconate) soap before surgery.  CHG is an antiseptic cleaner which kills germs and bonds with the skin to continue killing germs even after washing. Please DO NOT use if you have an allergy to CHG or antibacterial soaps.  If your skin becomes reddened/irritated stop using the CHG and inform your nurse when you arrive at Short Stay. Do not shave (including legs and underarms) for at least 48 hours prior to the first CHG shower.  You may shave your face/neck.  Please follow these instructions carefully:  1.  Shower with CHG Soap the night before surgery and the  morning of surgery.  2.  If you choose to wash your hair, wash your hair first as usual with your normal  shampoo.  3.  After you shampoo, rinse your hair and body thoroughly to remove the shampoo.                             4.  Use CHG as you would any other liquid soap.  You can apply chg directly to the skin and wash.  Gently with a scrungie  or clean washcloth.  5.  Apply the CHG Soap to your body ONLY FROM THE NECK DOWN.   Do   not use on face/ open                           Wound or open sores. Avoid contact with eyes, ears mouth and   genitals (private parts).                       Wash face,  Genitals (private parts) with your normal soap.             6.  Wash thoroughly, paying special attention to the area where your    surgery  will be performed.  7.  Thoroughly rinse your body with warm water from the neck down.  8.  DO  NOT shower/wash with your normal soap after using and rinsing off the CHG Soap.                9.  Pat yourself dry with a clean towel.            10.  Wear clean pajamas.            11.  Place clean sheets on your bed the night of your first shower and do not  sleep with pets. Day of Surgery : Do not apply any lotions/deodorants the morning of surgery.  Please wear clean clothes to the hospital/surgery center.  FAILURE TO FOLLOW THESE INSTRUCTIONS MAY RESULT IN THE CANCELLATION OF YOUR SURGERY  PATIENT SIGNATURE_________________________________  NURSE SIGNATURE__________________________________  ________________________________________________________________________

## 2024-06-17 NOTE — Telephone Encounter (Signed)
 See previous message

## 2024-06-19 ENCOUNTER — Encounter (HOSPITAL_COMMUNITY)
Admission: RE | Admit: 2024-06-19 | Discharge: 2024-06-19 | Disposition: A | Discharge status: Home / Self Care | Source: Ambulatory Visit | Attending: Urology | Admitting: Urology

## 2024-06-19 ENCOUNTER — Other Ambulatory Visit: Payer: Self-pay

## 2024-06-19 ENCOUNTER — Encounter (HOSPITAL_COMMUNITY): Payer: Self-pay

## 2024-06-19 VITALS — BP 132/87 | HR 116 | Temp 98.4°F | Resp 16 | Ht <= 58 in | Wt 181.0 lb

## 2024-06-19 DIAGNOSIS — Z01812 Encounter for preprocedural laboratory examination: Secondary | ICD-10-CM | POA: Insufficient documentation

## 2024-06-19 DIAGNOSIS — Z01818 Encounter for other preprocedural examination: Secondary | ICD-10-CM

## 2024-06-19 HISTORY — DX: Gastro-esophageal reflux disease without esophagitis: K21.9

## 2024-06-19 HISTORY — DX: Personal history of urinary calculi: Z87.442

## 2024-06-19 HISTORY — DX: Cardiac arrhythmia, unspecified: I49.9

## 2024-06-19 HISTORY — DX: Depression, unspecified: F32.A

## 2024-06-19 LAB — CBC
HCT: 44.3 % (ref 36.0–46.0)
Hemoglobin: 14 g/dL (ref 12.0–15.0)
MCH: 30.6 pg (ref 26.0–34.0)
MCHC: 31.6 g/dL (ref 30.0–36.0)
MCV: 96.9 fL (ref 80.0–100.0)
Platelets: 242 K/uL (ref 150–400)
RBC: 4.57 MIL/uL (ref 3.87–5.11)
RDW: 13.4 % (ref 11.5–15.5)
WBC: 10.8 K/uL — ABNORMAL HIGH (ref 4.0–10.5)
nRBC: 0 % (ref 0.0–0.2)

## 2024-06-19 LAB — BASIC METABOLIC PANEL WITH GFR
Anion gap: 10 (ref 5–15)
BUN: 12 mg/dL (ref 6–20)
CO2: 26 mmol/L (ref 22–32)
Calcium: 9.2 mg/dL (ref 8.9–10.3)
Chloride: 106 mmol/L (ref 98–111)
Creatinine, Ser: 0.8 mg/dL (ref 0.44–1.00)
GFR, Estimated: >60 mL/min (ref >60)
Glucose, Bld: 95 mg/dL (ref 70–99)
Potassium: 3.7 mmol/L (ref 3.5–5.1)
Sodium: 142 mmol/L (ref 135–145)

## 2024-06-19 LAB — GLUCOSE, CAPILLARY: Glucose-Capillary: 119 mg/dL — ABNORMAL HIGH (ref 70–99)

## 2024-06-23 ENCOUNTER — Other Ambulatory Visit

## 2024-06-23 ENCOUNTER — Ambulatory Visit: Payer: Self-pay | Admitting: Urology

## 2024-06-23 DIAGNOSIS — R8290 Unspecified abnormal findings in urine: Secondary | ICD-10-CM

## 2024-06-23 LAB — MICROSCOPIC EXAMINATION

## 2024-06-23 LAB — URINALYSIS, ROUTINE W REFLEX MICROSCOPIC
Bilirubin, UA: NEGATIVE
Glucose, UA: NEGATIVE
Ketones, UA: NEGATIVE
Nitrite, UA: NEGATIVE
Specific Gravity, UA: 1.015 (ref 1.005–1.030)
Urobilinogen, Ur: 0.2 mg/dL (ref 0.2–1.0)
pH, UA: 6.5 (ref 5.0–7.5)

## 2024-06-23 NOTE — Anesthesia Preprocedure Evaluation (Signed)
 Anesthesia Evaluation  Patient identified by MRN, date of birth, ID band Patient awake    Reviewed: Allergy & Precautions, NPO status , Patient's Chart, lab work & pertinent test results  History of Anesthesia Complications Negative for: history of anesthetic complications  Airway Mallampati: III  TM Distance: >3 FB Neck ROM: Full    Dental no notable dental hx. (+) Teeth Intact, Dental Advisory Given   Pulmonary asthma , COPD, Current SmokerPatient did not abstain from smoking.   Pulmonary exam normal breath sounds clear to auscultation       Cardiovascular Exercise Tolerance: Good Normal cardiovascular exam+ dysrhythmias  Rhythm:Regular Rate:Normal     Neuro/Psych  PSYCHIATRIC DISORDERS Anxiety Depression       GI/Hepatic ,GERD  ,,  Endo/Other  diabetes, Type 2  Class 3 obesity  Renal/GU Renal diseaseLab Results      Component                Value               Date                       K                        3.7                 06/19/2024                CO2                      26                  06/19/2024                   CREATININE               0.80                06/19/2024                GFRNONAA                 >60                 06/19/2024                 GLUCOSE                  95                  06/19/2024                Musculoskeletal  (+) Arthritis , Rheumatoid disorders,  Fibromyalgia -  Abdominal   Peds  Hematology  (+) Blood dyscrasia Lab Results      Component                Value               Date                      WBC                      10.8 (H)            06/19/2024  HGB                      14.0                06/19/2024                HCT                      44.3                06/19/2024                MCV                      96.9                06/19/2024                PLT                      242                 06/19/2024              Anesthesia Other  Findings All: NSAIDS, ASA, Macrobid, flagyl   Reproductive/Obstetrics                              Anesthesia Physical Anesthesia Plan  ASA: 3  Anesthesia Plan: General   Post-op Pain Management: Tylenol  PO (pre-op)*   Induction: Intravenous  PONV Risk Score and Plan: 4 or greater and Treatment may vary due to age or medical condition, Midazolam , Ondansetron  and Dexamethasone   Airway Management Planned: LMA  Additional Equipment: None  Intra-op Plan:   Post-operative Plan: Extubation in OR  Informed Consent: I have reviewed the patients History and Physical, chart, labs and discussed the procedure including the risks, benefits and alternatives for the proposed anesthesia with the patient or authorized representative who has indicated his/her understanding and acceptance.     Dental advisory given  Plan Discussed with: CRNA and Surgeon  Anesthesia Plan Comments:          Anesthesia Quick Evaluation

## 2024-06-24 ENCOUNTER — Ambulatory Visit (HOSPITAL_COMMUNITY): Payer: Self-pay | Admitting: Medical

## 2024-06-24 ENCOUNTER — Ambulatory Visit (HOSPITAL_BASED_OUTPATIENT_CLINIC_OR_DEPARTMENT_OTHER): Payer: Self-pay | Admitting: Anesthesiology

## 2024-06-24 ENCOUNTER — Ambulatory Visit (HOSPITAL_COMMUNITY)
Admission: RE | Admit: 2024-06-24 | Discharge: 2024-06-24 | Disposition: A | Discharge status: Home / Self Care | Attending: Urology | Admitting: Urology

## 2024-06-24 ENCOUNTER — Ambulatory Visit (HOSPITAL_COMMUNITY)

## 2024-06-24 ENCOUNTER — Encounter (HOSPITAL_COMMUNITY)
Admission: RE | Disposition: A | Discharge status: Home / Self Care | Payer: Self-pay | Source: Home / Self Care | Attending: Urology

## 2024-06-24 ENCOUNTER — Other Ambulatory Visit: Payer: Self-pay

## 2024-06-24 ENCOUNTER — Encounter (HOSPITAL_COMMUNITY): Payer: Self-pay | Admitting: Urology

## 2024-06-24 DIAGNOSIS — I499 Cardiac arrhythmia, unspecified: Secondary | ICD-10-CM

## 2024-06-24 DIAGNOSIS — Z87440 Personal history of urinary (tract) infections: Secondary | ICD-10-CM | POA: Diagnosis not present

## 2024-06-24 DIAGNOSIS — R8290 Unspecified abnormal findings in urine: Secondary | ICD-10-CM

## 2024-06-24 DIAGNOSIS — Z6837 Body mass index (BMI) 37.0-37.9, adult: Secondary | ICD-10-CM | POA: Insufficient documentation

## 2024-06-24 DIAGNOSIS — E66813 Obesity, class 3: Secondary | ICD-10-CM | POA: Insufficient documentation

## 2024-06-24 DIAGNOSIS — Z466 Encounter for fitting and adjustment of urinary device: Secondary | ICD-10-CM | POA: Diagnosis not present

## 2024-06-24 DIAGNOSIS — N201 Calculus of ureter: Secondary | ICD-10-CM

## 2024-06-24 DIAGNOSIS — E118 Type 2 diabetes mellitus with unspecified complications: Secondary | ICD-10-CM | POA: Insufficient documentation

## 2024-06-24 DIAGNOSIS — J441 Chronic obstructive pulmonary disease with (acute) exacerbation: Secondary | ICD-10-CM

## 2024-06-24 DIAGNOSIS — F17210 Nicotine dependence, cigarettes, uncomplicated: Secondary | ICD-10-CM | POA: Insufficient documentation

## 2024-06-24 DIAGNOSIS — J4489 Other specified chronic obstructive pulmonary disease: Secondary | ICD-10-CM | POA: Diagnosis not present

## 2024-06-24 HISTORY — PX: CYSTOSCOPY/URETEROSCOPY/HOLMIUM LASER/STENT PLACEMENT: SHX6546

## 2024-06-24 LAB — GLUCOSE, CAPILLARY
Glucose-Capillary: 111 mg/dL — ABNORMAL HIGH (ref 70–99)
Glucose-Capillary: 122 mg/dL — ABNORMAL HIGH (ref 70–99)

## 2024-06-24 SURGERY — CYSTOSCOPY/URETEROSCOPY/HOLMIUM LASER/STENT PLACEMENT
Anesthesia: General | Site: Pelvis | Laterality: Left

## 2024-06-24 MED ORDER — PHENAZOPYRIDINE HCL 200 MG PO TABS: 200.0000 mg | ORAL_TABLET | Freq: Three times a day (TID) | ORAL | 0 refills | Status: DC | PRN

## 2024-06-24 MED ORDER — DEXAMETHASONE SODIUM PHOSPHATE 10 MG/ML IJ SOLN
INTRAMUSCULAR | Status: AC
  Filled 2024-06-24: qty 1

## 2024-06-24 MED ORDER — KETOROLAC TROMETHAMINE 30 MG/ML IJ SOLN
INTRAMUSCULAR | Status: AC
  Filled 2024-06-24: qty 1

## 2024-06-24 MED ORDER — INSULIN ASPART 100 UNIT/ML IJ SOLN: 0.0000 [IU] | INTRAMUSCULAR | Status: DC | PRN

## 2024-06-24 MED ORDER — MIDAZOLAM HCL 5 MG/5ML IJ SOLN
INTRAMUSCULAR | Status: DC | PRN
  Administered 2024-06-24: 2 mg via INTRAVENOUS

## 2024-06-24 MED ORDER — LACTATED RINGERS IV SOLN
INTRAVENOUS | Status: DC
Start: 2024-06-24 — End: 2024-06-24

## 2024-06-24 MED ORDER — ACETAMINOPHEN 10 MG/ML IV SOLN
INTRAVENOUS | Status: AC
  Filled 2024-06-24: qty 100

## 2024-06-24 MED ORDER — FENTANYL CITRATE (PF) 100 MCG/2ML IJ SOLN
INTRAMUSCULAR | Status: DC | PRN
  Administered 2024-06-24 (×2): 50 ug via INTRAVENOUS

## 2024-06-24 MED ORDER — HYDROMORPHONE HCL 1 MG/ML IJ SOLN
0.2500 mg | INTRAMUSCULAR | Status: DC | PRN
  Administered 2024-06-24 (×4): 0.5 mg via INTRAVENOUS

## 2024-06-24 MED ORDER — ONDANSETRON HCL 4 MG/2ML IJ SOLN
INTRAMUSCULAR | Status: DC | PRN
Start: 2024-06-24 — End: 2024-06-24
  Administered 2024-06-24: 4 mg via INTRAVENOUS

## 2024-06-24 MED ORDER — LEVOFLOXACIN 500 MG PO TABS: 500.0000 mg | ORAL_TABLET | Freq: Every day | ORAL | 0 refills | Status: AC

## 2024-06-24 MED ORDER — PROPOFOL 10 MG/ML IV BOLUS
INTRAVENOUS | Status: DC | PRN
  Administered 2024-06-24: 180 mg via INTRAVENOUS

## 2024-06-24 MED ORDER — SODIUM CHLORIDE 0.9 % IV SOLN
1.0000 g | INTRAVENOUS | Status: AC
  Administered 2024-06-24: 2 g via INTRAVENOUS
  Filled 2024-06-24: qty 10

## 2024-06-24 MED ORDER — MIDAZOLAM HCL 2 MG/2ML IJ SOLN
INTRAMUSCULAR | Status: AC
  Filled 2024-06-24: qty 2

## 2024-06-24 MED ORDER — OXYCODONE HCL 5 MG PO TABS
ORAL_TABLET | ORAL | Status: AC
  Filled 2024-06-24: qty 1

## 2024-06-24 MED ORDER — OXYCODONE HCL 5 MG PO TABS
5.0000 mg | ORAL_TABLET | Freq: Once | ORAL | Status: AC | PRN
  Administered 2024-06-24: 5 mg via ORAL

## 2024-06-24 MED ORDER — ORAL CARE MOUTH RINSE: 15.0000 mL | Freq: Once | OROMUCOSAL | Status: AC

## 2024-06-24 MED ORDER — OXYCODONE HCL 5 MG/5ML PO SOLN: 5.0000 mg | Freq: Once | ORAL | Status: AC | PRN

## 2024-06-24 MED ORDER — LIDOCAINE HCL URETHRAL/MUCOSAL 2 % EX GEL
CUTANEOUS | Status: AC
  Filled 2024-06-24: qty 5

## 2024-06-24 MED ORDER — ONDANSETRON HCL 4 MG/2ML IJ SOLN: 4.0000 mg | Freq: Once | INTRAMUSCULAR | Status: DC | PRN

## 2024-06-24 MED ORDER — ACETAMINOPHEN 10 MG/ML IV SOLN
1000.0000 mg | Freq: Once | INTRAVENOUS | Status: DC | PRN
  Administered 2024-06-24: 1000 mg via INTRAVENOUS

## 2024-06-24 MED ORDER — HYDROMORPHONE HCL 1 MG/ML IJ SOLN
INTRAMUSCULAR | Status: AC
  Filled 2024-06-24: qty 1

## 2024-06-24 MED ORDER — DEXAMETHASONE SODIUM PHOSPHATE 10 MG/ML IJ SOLN
INTRAMUSCULAR | Status: DC | PRN
  Administered 2024-06-24: 10 mg via INTRAVENOUS

## 2024-06-24 MED ORDER — ONDANSETRON HCL 4 MG/2ML IJ SOLN
INTRAMUSCULAR | Status: AC
  Filled 2024-06-24: qty 2

## 2024-06-24 MED ORDER — LIDOCAINE HCL URETHRAL/MUCOSAL 2 % EX GEL
CUTANEOUS | Status: DC | PRN
  Administered 2024-06-24: 1 via URETHRAL

## 2024-06-24 MED ORDER — SODIUM CHLORIDE 0.9 % IR SOLN
Status: DC | PRN
  Administered 2024-06-24: 6000 mL via INTRAVESICAL

## 2024-06-24 MED ORDER — IOHEXOL 300 MG/ML  SOLN
INTRAMUSCULAR | Status: DC | PRN
  Administered 2024-06-24: 6 mL via URETHRAL

## 2024-06-24 MED ORDER — LIDOCAINE HCL (PF) 2 % IJ SOLN
INTRAMUSCULAR | Status: DC | PRN
  Administered 2024-06-24: 100 mg via INTRADERMAL

## 2024-06-24 MED ORDER — OXYCODONE-ACETAMINOPHEN 10-325 MG PO TABS: 1.0000 | ORAL_TABLET | Freq: Four times a day (QID) | ORAL | 0 refills | Status: DC | PRN

## 2024-06-24 MED ORDER — PHENYLEPHRINE 80 MCG/ML (10ML) SYRINGE FOR IV PUSH (FOR BLOOD PRESSURE SUPPORT)
PREFILLED_SYRINGE | INTRAVENOUS | Status: DC | PRN
  Administered 2024-06-24: 160 ug via INTRAVENOUS

## 2024-06-24 MED ORDER — LIDOCAINE HCL (PF) 2 % IJ SOLN
INTRAMUSCULAR | Status: AC
  Filled 2024-06-24: qty 5

## 2024-06-24 MED ORDER — FENTANYL CITRATE (PF) 100 MCG/2ML IJ SOLN
INTRAMUSCULAR | Status: AC
  Filled 2024-06-24: qty 2

## 2024-06-24 MED ORDER — CHLORHEXIDINE GLUCONATE 0.12 % MT SOLN
15.0000 mL | Freq: Once | OROMUCOSAL | Status: AC
  Administered 2024-06-24: 15 mL via OROMUCOSAL

## 2024-06-24 MED ORDER — PHENYLEPHRINE 80 MCG/ML (10ML) SYRINGE FOR IV PUSH (FOR BLOOD PRESSURE SUPPORT)
PREFILLED_SYRINGE | INTRAVENOUS | Status: AC
  Filled 2024-06-24: qty 10

## 2024-06-24 SURGICAL SUPPLY — 19 items
BAG COUNTER SPONGE SURGICOUNT (BAG) IMPLANT
BAG URO CATCHER STRL LF (MISCELLANEOUS) ×1 IMPLANT
BASKET ZERO TIP NITINOL 2.4FR (BASKET) IMPLANT
CATH URETL OPEN END 6FR 70 (CATHETERS) IMPLANT
CLOTH BEACON ORANGE TIMEOUT ST (SAFETY) ×1 IMPLANT
EXTRACTOR STONE 1.7FRX115CM (UROLOGICAL SUPPLIES) IMPLANT
FIBER LASER MOSES 200 DFL (Laser) IMPLANT
GLOVE SURG LX STRL 8.0 MICRO (GLOVE) ×1 IMPLANT
GOWN STRL REUS W/ TWL XL LVL3 (GOWN DISPOSABLE) ×1 IMPLANT
GUIDEWIRE STR DUAL SENSOR (WIRE) ×1 IMPLANT
GUIDEWIRE ZIPWRE .038 STRAIGHT (WIRE) IMPLANT
KIT TURNOVER KIT A (KITS) ×1 IMPLANT
MANIFOLD NEPTUNE II (INSTRUMENTS) ×1 IMPLANT
PACK CYSTO (CUSTOM PROCEDURE TRAY) ×1 IMPLANT
SHEATH NAVIGATOR HD 12/14X36 (SHEATH) IMPLANT
STENT URET 6FRX24 CONTOUR (STENTS) IMPLANT
TRACTIP FLEXIVA PULS ID 200XHI (Laser) IMPLANT
TUBING CONNECTING 10 (TUBING) ×1 IMPLANT
TUBING UROLOGY SET (TUBING) ×1 IMPLANT

## 2024-06-24 NOTE — Anesthesia Postprocedure Evaluation (Signed)
 Anesthesia Post Note  Patient: Cassandra Lucas  Procedure(s) Performed: CYSTOSCOPY/URETEROSCOPY/HOLMIUM LASER/STENT PLACEMENT (Left: Pelvis)     Patient location during evaluation: PACU Anesthesia Type: General Level of consciousness: awake and alert Pain management: pain level controlled Vital Signs Assessment: post-procedure vital signs reviewed and stable Respiratory status: spontaneous breathing, nonlabored ventilation, respiratory function stable and patient connected to nasal cannula oxygen  Cardiovascular status: blood pressure returned to baseline and stable Postop Assessment: no apparent nausea or vomiting Anesthetic complications: no   No notable events documented.  Last Vitals:  Vitals:   06/24/24 1515 06/24/24 1524  BP: (!) 140/92   Pulse: 98 95  Resp: 17 11  Temp:    SpO2: 93% 90%    Last Pain:  Vitals:   06/24/24 1556  TempSrc:   PainSc: 6                  Garnette DELENA Gab

## 2024-06-24 NOTE — Op Note (Signed)
 OPERATIVE NOTE   Patient Name: Cassandra Lucas  MRN: [id number]   Date of Procedure: 06/24/24    Preoperative diagnosis:  Left ureteral calculus History of UTI  Postoperative diagnosis:  Left ureteral calculus History of UTI  Procedure:  Cystoscopy Left retrograde pyelogram with intraoperative interpretation Left ureteroscopic laser lithotripsy with stone manipulation Exchange of left ureteral stent (58F x 24 cm, with tether)  Attending: Adine DOROTHA Manly, MD  Anesthesia: General  Estimated blood loss: 5 mL  Fluids: Per anesthesia record  Drains: 58F X 24 cm left ureteral stent with tether  Specimens: Stone fragments given to patient  Antibiotics: Rocephin  1 g IV  Findings:  - Normal urethra and bladder - 12 mm calculus in left proximal ureter  Indications:  48 year old female presents for surgical management of a left ureteral calculus.  She was seen as a hospital consult on 04/15/2024.  She presented at that time with a 3-week history of left flank pain.  CT study showed a 9 x 11 x 14 mm stone in the left proximal ureter with associated left hydronephrosis.  Her urinalysis was suspicious for UTI.  She was taken to the operating room where she underwent cystoscopy and attempted left ureteral stent placement.  Unfortunately, it was not possible to access the proximal ureter beyond the calculus.  She subsequently underwent insertion of a left nephrostomy tube on 04/15/2024.  Urine culture grew <10K colonies.  She underwent insertion of a left ureteral stent by interventional radiology on 05/21/2024.  She was diagnosed with a UTI on 06/10/2024.  Urine culture grew Morganella.  She was treated with Levaquin x 7 days.  Repeat analysis from 06/23/2024 showed resolution of the UTI.  She presents now for cystoscopy, left retrograde pyelogram, left ureteroscopic laser lithotripsy, and exchange of left ureteral stent.  The procedure including potential risk discussed with the patient  in detail.  She understands and wishes to proceed as described.  Description of Procedure:  The patient was taken to the operating room suite and properly identified.  After successful induction of a general anesthetic, she was placed in the dorsolithotomy position.  The patient was given IV Rocephin  in the operating room.  The patient's genital area was prepped and draped in sterile fashion.  A preoperative timeout was performed.  Under direct visualization, a 22 French rigid cystoscope was passed through the urethra into the bladder.  The bladder was inspected throughout its entirety.  The left ureteral stent was seen emanating from the left ureteral orifice.  No other mucosal abnormalities were seen. Scout film showed a nonspecific bowel gas pattern with the left ureteral stent in good position.  A 12 mm calcification was seen adjacent to the left ureteral stent at the L4 level. A sensor guidewire was passed into the left ureter alongside the indwelling ureteral stent.  The wire was passed into the left collecting system under fluoroscopic guidance.  Using stent graspers, the distal end of the left ureteral stent was pulled to the meatus.  A second wire was passed through the stent and coiled in the left collecting system.  The stent was removed without difficulty.  The second wire was removed following stent removal.  Ureteroscopy was performed alongside the guidewire.  A small stone fragment was seen in the distal ureter and this was removed using an Ngage stone basket.  The ureteroscope was then passed proximally into the left ureter.  The large ureteral calculus was identified.  Using the 242 m homing laser fiber,  the stone was completely fragmented.  The fragments were irrigated from the ureter with the ureteroscope.  Larger fragments were removed using the stone basket.  All visible fragments were removed moved from the ureter and irrigated into the bladder.  The ureteroscope was passed all the way  into the left renal pelvis and no remaining fragments were seen.  The ureter was inspected throughout its entirety and there was no evidence of any injury.  A 56F x 26 cm left ureteral stent was then passed over the guidewire and into the left renal pelvis.  Position was confirmed by fluoroscopy.  The tether was left attached and brought through the meatus.  The bladder was drained and all stone fragments were irrigated out of the bladder.  Intraurethral lidocaine  jelly was placed.  The patient was then extubated and taken to the post anesthesia care unit in stable condition.  Complications: None  Condition: Stable, extubated, transferred to PACU  Plan:  Continue left ureteral stent x 7 days. Discharge to home.

## 2024-06-24 NOTE — Interval H&P Note (Signed)
 History and Physical Interval Note:  06/24/2024 1:05 PM  Cassandra Lucas  has presented today for surgery, with the diagnosis of Ureterolithiasis.  The various methods of treatment have been discussed with the patient and family. After consideration of risks, benefits and other options for treatment, the patient has consented to  Procedure(s): CYSTOSCOPY/URETEROSCOPY/HOLMIUM LASER/STENT PLACEMENT (Left) as a surgical intervention.  The patient's history has been reviewed, patient examined, no change in status, stable for surgery.  I have reviewed the patient's chart and labs.  Questions were answered to the patient's satisfaction.     Adine Manly

## 2024-06-24 NOTE — Anesthesia Procedure Notes (Signed)
 Procedure Name: LMA Insertion Date/Time: 06/24/2024 1:49 PM  Performed by: Carleton Garnette SAUNDERS, CRNAPre-anesthesia Checklist: Patient identified, Emergency Drugs available, Suction available, Patient being monitored and Timeout performed Patient Re-evaluated:Patient Re-evaluated prior to induction Oxygen  Delivery Method: Circle system utilized Preoxygenation: Pre-oxygenation with 100% oxygen  Induction Type: IV induction LMA: LMA inserted LMA Size: 4.0 Tube type: Oral Number of attempts: 1 Placement Confirmation: positive ETCO2 and breath sounds checked- equal and bilateral Tube secured with: Tape Dental Injury: Teeth and Oropharynx as per pre-operative assessment

## 2024-06-24 NOTE — Transfer of Care (Signed)
 Immediate Anesthesia Transfer of Care Note  Patient: Cassandra Lucas  Procedure(s) Performed: CYSTOSCOPY/URETEROSCOPY/HOLMIUM LASER/STENT PLACEMENT (Left: Pelvis)  Patient Location: PACU  Anesthesia Type:General  Level of Consciousness: sedated  Airway & Oxygen  Therapy: Patient Spontanous Breathing and Patient connected to face mask oxygen   Post-op Assessment: Report given to RN and Post -op Vital signs reviewed and stable  Post vital signs: Reviewed and stable  Last Vitals:  Vitals Value Taken Time  BP 124/95 06/24/24 14:50  Temp    Pulse 94 06/24/24 14:52  Resp 11 06/24/24 14:52  SpO2 99 % 06/24/24 14:52  Vitals shown include unfiled device data.  Last Pain:  Vitals:   06/24/24 1315  TempSrc:   PainSc: 7          Complications: No notable events documented.

## 2024-06-25 ENCOUNTER — Encounter (HOSPITAL_COMMUNITY): Payer: Self-pay | Admitting: Urology

## 2024-06-30 NOTE — Progress Notes (Deleted)
 12/11/23- 48 yoF Smoker (1/2 ppd, down from 1 ppd) for Sleep evaluation and Pulmonary evaluation courtesy of Janell Grossman, NP with concern of fatigue Medical problem list includes Asthma/ COPD, DM2, Rheumatoid Arthritis,HTN,  -meds include flexeril , Bu-trans, prometh -DM, Effexor XR, prednisone  10, Percocet/oxycodone , Albuterol  neb, Albuterol  hfa, flonase , Combivent Epworth score-13 Body weight today- 190 lbs -----Fatigue during the day. Not sleeping well.  Snoring.  Also cough, wheeze and SOB with recurrent pneumonias x 10 years. Describes snoring, witnessed apnea, frequent nocturia, never feels rested.  Flexeril  helps sleep. 2-3 caffeine sodas/ day. Occasional naps. Not working. Tonsillectomy. Thyroid  surgery.     Also concerned about dyspnea on exertion- hills, stairs. Daily cough with white phlegm Hx several pneumonias. Rapid heart rate routinely. Recent CXR at Urgent Care outside our system. She was not told of any problem.  We are going to try samples of Breztri , apply for Ohtuvayre  neb, and emhasize smking cessation. Discussed the use of AI scribe software for clinical note transcription with the patient, who gave verbal consent to proceed. History of Present Illness   The patient, with a history of recurrent pneumonia and rheumatoid arthritis, presents with persistent tiredness despite getting sleep at night. She reports a history of rapid heartbeat and an abnormal EKG, but denies any known heart disease. The patient is a current smoker, trying to quit, and has a history of heavy smoking.  The patient recently recovered from pneumonia, treated with Zpak and prednisone . She reports that her lungs still feel junky but a recent x-ray did not show any signs of pneumonia. The patient uses a nebulizer machine and inhalers (albuterol  and Combivent) for her breathing but reports getting dizzy with the nebulizer.  The patient also reports having sleep issues. She has trouble falling asleep and  wakes up frequently at night to urinate. Despite getting sleep, she reports feeling tired all the time. The patient snores and has been told she stops breathing in her sleep. She takes Flexeril  at night to help with muscle relaxation.     Assessment and Plan:    Chronic Obstructive Pulmonary Disease (COPD) exacerbation Significant wheezing and chronic respiratory symptoms. Smoking history is a risk factor. Albuterol  and Combivent used; nebulizer rarely due to dizziness. - Provide Breztri  samples for trial to improve respiratory control. - Discuss Ohtuvayre  nebulizer medication twice daily, pending manufacturer's assistance due to cost. - Consider home oxygen  therapy in the future if needed. - Encourage smoking cessation to improve respiratory health.  Sleep Apnea Symptoms suggestive of sleep apnea with potential cardiovascular and metabolic risks. - Order a home sleep test to evaluate for sleep apnea. - Instruct her to follow up in two weeks if she has not received results from the sleep study.  Rheumatoid Arthritis On low dose prednisone  and Flexeril  for muscle relaxation. - Continue current prednisone  regimen. - Continue Flexeril  at night for muscle relaxation.    Rheumatology manages   07/01/24- 48 yoF s Smoker (1/2 ppd, down from 1 ppd) for Sleep evaluation and Pulmonary evaluation courtesy of Janell Grossman, NP with concern of fatigue, DOE, Medical problem list includes Asthma/ COPD, DM2, Rheumatoid Arthritis,HTN,  -meds include flexeril , Bu-trans, prometh -DM, Effexor XR, prednisone  10, Percocet/oxycodone , Albuterol  neb, Albuterol  hfa, flonase , Combivent Epworth score-13 Body weight today- 190 lbs At last ov we gave samples Breztri , ordered Ohtuvayre , scheduled HST> HST-02/09/24- AHI 37.9/hr, desat to 78%, body weight 190 lbs CPAP 5-20/ [employer]  ordered 05/07/24 Had kidney stone/ cystoscopy.   CXR  IMPRESSION: No active disease.  ROS-see  HPI   + = positive Constitutional:     weight loss, night sweats, fevers, chills, fatigue, lassitude. HEENT:    +headaches, difficulty swallowing, tooth/dental problems, sore throat,       sneezing, itching, ear ache, nasal congestion, post nasal drip, snoring CV:    chest pain, orthopnea, PND, swelling in lower extremities, anasarca,                                   dizziness, palpitations Resp:   shortness of breath with exertion or at rest.                productive cough,   non-productive cough, coughing up of blood.              change in color of mucus.  wheezing.   Skin:    rash or lesions. GI:  No-   heartburn, indigestion, abdominal pain, nausea, vomiting, diarrhea,                 change in bowel habits, loss of appetite GU: dysuria, change in color of urine, no urgency or frequency.   flank pain. MS:   joint pain, stiffness, decreased range of motion, back pain. Neuro-     nothing unusual Psych:  change in mood or affect.  depression or anxiety.   memory loss.  OBJ- Physical Exam General- Alert, Oriented, Affect-appropriate, Distress- none acute, +obese/short Skin- rash-none, lesions- none, excoriation- none, +sunburn arms  Lymphadenopathy- none Head- atraumatic            Eyes- Gross vision intact, PERRLA, conjunctivae and secretions clear            Ears- Hearing, canals-normal            Nose- Clear, no-Septal dev, mucus, polyps, erosion, perforation             Throat- Mallampati IV , mucosa clear , drainage- none, tonsils- absent, +teeth Neck- flexible , trachea midline, no stridor , thyroid  nl, carotid no bruit Chest - symmetrical excursion , unlabored           Heart/CV- RRR , no murmur , no gallop  , no rub, nl s1 s2                           - JVD- none , edema- none, stasis changes- none, varices- none           Lung- clear to P&A, wheeze- none, cough- none , dullness-none, rub- none           Chest wall-  Abd-  Br/ Gen/ Rectal- Not done, not indicated Extrem- cyanosis- none, clubbing, none,  atrophy- none, strength- nl Neuro- grossly intact to observation

## 2024-07-01 ENCOUNTER — Ambulatory Visit: Admitting: Internal Medicine

## 2024-07-02 ENCOUNTER — Ambulatory Visit: Admitting: Urology

## 2024-07-02 ENCOUNTER — Encounter: Payer: Self-pay | Admitting: Urology

## 2024-07-02 VITALS — BP 139/90 | HR 125 | Ht <= 58 in | Wt 188.0 lb

## 2024-07-02 DIAGNOSIS — N201 Calculus of ureter: Secondary | ICD-10-CM | POA: Diagnosis not present

## 2024-07-02 DIAGNOSIS — Z87440 Personal history of urinary (tract) infections: Secondary | ICD-10-CM

## 2024-07-02 LAB — URINALYSIS, ROUTINE W REFLEX MICROSCOPIC
Bilirubin, UA: NEGATIVE
Glucose, UA: NEGATIVE
Ketones, UA: NEGATIVE
Leukocytes,UA: NEGATIVE
Nitrite, UA: NEGATIVE
Specific Gravity, UA: >=1.03 — AB (ref 1.005–1.030)
Urobilinogen, Ur: 0.2 mg/dL (ref 0.2–1.0)
pH, UA: 6.5 (ref 5.0–7.5)

## 2024-07-02 LAB — MICROSCOPIC EXAMINATION

## 2024-07-02 MED ORDER — CIPROFLOXACIN HCL 500 MG PO TABS
500.0000 mg | ORAL_TABLET | Freq: Once | ORAL | Status: AC
  Administered 2024-07-02: 500 mg via ORAL

## 2024-07-02 NOTE — Progress Notes (Signed)
 Assessment: 1. Ureterolithiasis   2. History of UTI     Plan: Left ureteral stent was removed by patient yesterday.  She brought the stent with her. Cipro  x 1 today Return to office in 1 month.   Chief Complaint: Chief Complaint  Patient presents with   Nephrolithiasis    HPI: Cassandra Lucas is a 48 y.o. female who presents for continued evaluation of left ureteral calculus. She was seen as a hospital consult on 04/15/2024.  She reported a long history of nephrolithiasis passing the majority of her stone spontaneously.  She presented to the emergency room on 04/14/2024 with a 3-week history of left-sided flank pain.  She had some associated nausea without vomiting.  No fevers or chills.  Creatinine 1.24. CT renal stone study showed a 9 x 11 x 14 mm stone in the left proximal ureter with associated left hydronephrosis. Urinalysis showed >50 RBCs, >50 WBCs, rare bacteria. She was taken to the operating room on 04/15/2024 for cystoscopy and left ureteral stent placement.  I was unable to pass a guidewire beyond the proximal left ureteral calculus. She subsequently underwent insertion of a left nephrostomy tube by interventional radiology on 04/15/2024. Urine culture grew <10K colonies. She presented back to the emergency room on 04/19/2024 with flank pain. CT imaging showed decreased left hydronephrosis with a 12 mm stone in the proximal left ureter.  At her visit on 05/12/2024, she continued with the left nephrostomy tube.  She had some intermittent discomfort in the flank area associated with the nephrostomy tube.  Her urine remained fairly clear.  She had output between [phone] mL/day from the left kidney.  No fevers or chills.  She completed a course of Bactrim  for a UTI.  No culture results available. She underwent placement of an 8.5 [place] by 26 cm double-J stent on the left by interventional radiology on 05/21/2024. Creatinine from 05/21/2024: 0.83 Urine culture from 06/10/2024 grew  >100 K Morganella.  He was treated with Levaquin x 7 days. Repeat urinalysis on 06/23/2024 showed resolution of the UTI. She underwent left ureteroscopic laser lithotripsy with stone manipulation and exchange of a left ureteral stent on 06/24/2024.  All visible stone fragments were removed at the time of the procedure.  She presents today for follow-up.  She reports inadvertently removing the stent yesterday.  She continues to have some frequency, urgency, incontinence, and low back pain following stent removal.  No fevers or chills.  She has completed her antibiotics.   Portions of the above documentation were copied from a prior visit for review purposes only.  Allergies: Allergies  Allergen Reactions   Nsaids Shortness Of Breath   Flagyl  [Metronidazole ] Other (See Comments)    Patient is not sure what happens    Macrobid [Nitrofurantoin Macrocrystal] Nausea And Vomiting   Aspirin Other (See Comments)    Unknown childhood reaction    PMH: Past Medical History:  Diagnosis Date   Anemia    Anxiety    Arthritis    Asthma    Carpal tunnel syndrome, bilateral 11/22/2015   Will try wrist splints   Community acquired pneumonia    COPD (chronic obstructive pulmonary disease) (HCC)    Decreased libido 03/26/2015   Depression    Diabetes mellitus without complication (HCC)    prediabetes   Dyslipidemia 11/24/2015   Dysrhythmia    Empty sella    Fibromyalgia    Fibromyalgia    GERD (gastroesophageal reflux disease)    History of kidney stones  Hot flashes 03/26/2015   Hyperlipidemia    Kidney stones    Productive cough    smoker   Renal disorder    Vitamin D  deficiency 11/24/2015    PSH: Past Surgical History:  Procedure Laterality Date   ABDOMINAL HYSTERECTOMY     has ovaries.    BREAST CYST EXCISION     CESAREAN SECTION  2006   [facility]   CYSTOSCOPY WITH RETROGRADE PYELOGRAM, URETEROSCOPY AND STENT PLACEMENT Left 04/15/2024   Procedure: CYSTOSCOPY WITH RETROGRADE  PYELOGRAM;  Surgeon: Roseann Adine PARAS., MD;  Location: [facility];  Service: Urology;  Laterality: Left;   CYSTOSCOPY/URETEROSCOPY/HOLMIUM LASER/STENT PLACEMENT Left 06/24/2024   Procedure: CYSTOSCOPY/URETEROSCOPY/HOLMIUM LASER/STENT PLACEMENT;  Surgeon: Roseann Adine PARAS., MD;  Location: [facility];  Service: Urology;  Laterality: Left;   IR NEPHROSTOMY PLACEMENT LEFT  04/15/2024   IR URETERAL STENT PLACEMENT EXISTING ACCESS LEFT  05/21/2024   LAPAROSCOPIC CHOLECYSTECTOMY  2002   [facility]   LAPAROSCOPIC HYSTERECTOMY  04/12/2011   Procedure: HYSTERECTOMY TOTAL LAPAROSCOPIC;  Surgeon: Vonn VEAR Inch, MD;  Location: [facility];  Service: Gynecology;  Laterality: N/A;   THYROIDECTOMY, PARTIAL  2012   [facility], [facility]    SH: Social History   Tobacco Use   Smoking status: Every Day    Current packs/day: 0.25    Average packs/day: 0.3 packs/day for 17.0 years (4.3 ttl pk-yrs)    Types: Cigarettes   Smokeless tobacco: Never  Vaping Use   Vaping status: Former  Substance Use Topics   Alcohol use: No   Drug use: No    ROS: Constitutional:  Negative for fever, chills, weight loss CV: Negative for chest pain, previous MI, hypertension Respiratory:  Negative for shortness of breath, wheezing, sleep apnea, frequent cough GI:  Negative for nausea, vomiting, bloody stool, GERD  PE: BP (!) 139/90   Pulse (!) 125   Ht 4' 10 (1.473 m)   Wt 188 lb (85.3 kg)   LMP 01/22/2011   BMI 39.29 kg/m  GENERAL APPEARANCE:  Well appearing, well developed, well nourished, NAD HEENT:  Atraumatic, normocephalic, oropharynx clear NECK:  Supple without lymphadenopathy or thyromegaly ABDOMEN:  Soft, non-tender, no masses EXTREMITIES:  Moves all extremities well, without clubbing, cyanosis, or edema NEUROLOGIC:  Alert and oriented x 3, normal gait, CN II-XII grossly intact MENTAL STATUS:  appropriate BACK:  Non-tender to palpation, No CVAT SKIN:  Warm, dry, and intact   Results: U/A: 0-5 WBCs, 11-30 RBCs

## 2024-07-31 ENCOUNTER — Ambulatory Visit: Admitting: Urology

## 2024-08-07 ENCOUNTER — Ambulatory Visit: Admitting: Urology

## 2024-08-07 ENCOUNTER — Encounter: Payer: Self-pay | Admitting: Urology

## 2024-08-07 VITALS — BP 136/88 | HR 130 | Ht <= 58 in | Wt 180.0 lb

## 2024-08-07 DIAGNOSIS — N201 Calculus of ureter: Secondary | ICD-10-CM | POA: Diagnosis not present

## 2024-08-07 DIAGNOSIS — Z87440 Personal history of urinary (tract) infections: Secondary | ICD-10-CM

## 2024-08-07 LAB — URINALYSIS, ROUTINE W REFLEX MICROSCOPIC
Bilirubin, UA: NEGATIVE
Glucose, UA: NEGATIVE
Ketones, UA: NEGATIVE
Nitrite, UA: NEGATIVE
Specific Gravity, UA: 1.025 (ref 1.005–1.030)
Urobilinogen, Ur: 0.2 mg/dL (ref 0.2–1.0)
pH, UA: 6 (ref 5.0–7.5)

## 2024-08-07 LAB — MICROSCOPIC EXAMINATION

## 2024-08-07 NOTE — Progress Notes (Signed)
 Assessment: 1. Ureterolithiasis   2. History of UTI     Plan: Stone prevention discussed and information provided. Schedule for renal ultrasound for evaluation of left hydronephrosis secondary to left ureteral calculus. Will contact her with results of ultrasound.  Chief Complaint: Chief Complaint  Patient presents with   Recurrent UTI   Nephrolithiasis    HPI: Cassandra Lucas is a 48 y.o. female who presents for continued evaluation of left ureteral calculus. She was seen as a hospital consult on 04/15/2024.  She reported a long history of nephrolithiasis passing the majority of her stone spontaneously.  She presented to the emergency room on 04/14/2024 with a 3-week history of left-sided flank pain.  She had some associated nausea without vomiting.  No fevers or chills.  Creatinine 1.24. CT renal stone study showed a 9 x 11 x 14 mm stone in the left proximal ureter with associated left hydronephrosis. Urinalysis showed >50 RBCs, >50 WBCs, rare bacteria. She was taken to the operating room on 04/15/2024 for cystoscopy and left ureteral stent placement.  I was unable to pass a guidewire beyond the proximal left ureteral calculus. She subsequently underwent insertion of a left nephrostomy tube by interventional radiology on 04/15/2024. Urine culture grew <10K colonies. She presented back to the emergency room on 04/19/2024 with flank pain. CT imaging showed decreased left hydronephrosis with a 12 mm stone in the proximal left ureter.  At her visit on 05/12/2024, she continued with the left nephrostomy tube.  She had some intermittent discomfort in the flank area associated with the nephrostomy tube.  Her urine remained fairly clear.  She had output between [phone] mL/day from the left kidney.  No fevers or chills.  She completed a course of Bactrim  for a UTI.  No culture results available. She underwent placement of an 8.5 French by 26 cm double-J stent on the left by interventional  radiology on 05/21/2024. Creatinine from 05/21/2024: 0.83 Urine culture from 06/10/2024 grew >100 K Morganella.  He was treated with Levaquin  x 7 days. Repeat urinalysis on 06/23/2024 showed resolution of the UTI.  She underwent left ureteroscopic laser lithotripsy with stone manipulation and exchange of a left ureteral stent on 06/24/2024.  All visible stone fragments were removed at the time of the procedure. Her stent was inadvertently removed prior to her follow-up visit.  She returns today for follow-up.  She reports some intermittent discomfort in her back more so on the right side.  She also reports some urgency and stress incontinence.  No dysuria.  No fevers or chills.  Portions of the above documentation were copied from a prior visit for review purposes only.  Allergies: Allergies  Allergen Reactions   Nsaids Shortness Of Breath   Flagyl  [Metronidazole ] Other (See Comments)    Patient is not sure what happens    Macrobid [Nitrofurantoin Macrocrystal] Nausea And Vomiting   Aspirin Other (See Comments)    Unknown childhood reaction    PMH: Past Medical History:  Diagnosis Date   Anemia    Anxiety    Arthritis    Asthma    Carpal tunnel syndrome, bilateral 11/22/2015   Will try wrist splints   Community acquired pneumonia    COPD (chronic obstructive pulmonary disease) (HCC)    Decreased libido 03/26/2015   Depression    Diabetes mellitus without complication (HCC)    prediabetes   Dyslipidemia 11/24/2015   Dysrhythmia    Empty sella    Fibromyalgia    Fibromyalgia  GERD (gastroesophageal reflux disease)    History of kidney stones    Hot flashes 03/26/2015   Hyperlipidemia    Kidney stones    Productive cough    smoker   Renal disorder    Vitamin D  deficiency 11/24/2015    PSH: Past Surgical History:  Procedure Laterality Date   ABDOMINAL HYSTERECTOMY     has ovaries.    BREAST CYST EXCISION     CESAREAN SECTION  2006   [facility]   CYSTOSCOPY WITH  RETROGRADE PYELOGRAM, URETEROSCOPY AND STENT PLACEMENT Left 04/15/2024   Procedure: CYSTOSCOPY WITH RETROGRADE PYELOGRAM;  Surgeon: Roseann Adine PARAS., MD;  Location: [facility];  Service: Urology;  Laterality: Left;   CYSTOSCOPY/URETEROSCOPY/HOLMIUM LASER/STENT PLACEMENT Left 06/24/2024   Procedure: CYSTOSCOPY/URETEROSCOPY/HOLMIUM LASER/STENT PLACEMENT;  Surgeon: Roseann Adine PARAS., MD;  Location: [facility];  Service: Urology;  Laterality: Left;   IR NEPHROSTOMY PLACEMENT LEFT  04/15/2024   IR URETERAL STENT PLACEMENT EXISTING ACCESS LEFT  05/21/2024   LAPAROSCOPIC CHOLECYSTECTOMY  2002   [facility]   LAPAROSCOPIC HYSTERECTOMY  04/12/2011   Procedure: HYSTERECTOMY TOTAL LAPAROSCOPIC;  Surgeon: Vonn VEAR Inch, MD;  Location: [facility];  Service: Gynecology;  Laterality: N/A;   THYROIDECTOMY, PARTIAL  2012   [facility], [facility]    SH: Social History   Tobacco Use   Smoking status: Every Day    Current packs/day: 0.25    Average packs/day: 0.3 packs/day for 17.0 years (4.3 ttl pk-yrs)    Types: Cigarettes   Smokeless tobacco: Never  Vaping Use   Vaping status: Former  Substance Use Topics   Alcohol use: No   Drug use: No    ROS: Constitutional:  Negative for fever, chills, weight loss CV: Negative for chest pain, previous MI, hypertension Respiratory:  Negative for shortness of breath, wheezing, sleep apnea, frequent cough GI:  Negative for nausea, vomiting, bloody stool, GERD  PE: BP 136/88   Pulse (!) 130   Ht 4' 10 (1.473 m)   Wt 180 lb (81.6 kg)   LMP 01/22/2011   BMI 37.62 kg/m  GENERAL APPEARANCE:  Well appearing, well developed, well nourished, NAD HEENT:  Atraumatic, normocephalic, oropharynx clear NECK:  Supple without lymphadenopathy or thyromegaly ABDOMEN:  Soft, non-tender, no masses EXTREMITIES:  Moves all extremities well, without clubbing, cyanosis, or edema NEUROLOGIC:  Alert and oriented x 3, normal gait, CN II-XII grossly intact MENTAL STATUS:  appropriate BACK:  Non-tender  to palpation, No CVAT SKIN:  Warm, dry, and intact   Results: U/A: 0-5 WBCs, 11-30 RBCs, bacteria

## 2024-08-13 ENCOUNTER — Telehealth (HOSPITAL_COMMUNITY): Payer: Self-pay

## 2024-08-13 ENCOUNTER — Ambulatory Visit (HOSPITAL_COMMUNITY)

## 2024-08-13 ENCOUNTER — Ambulatory Visit (INDEPENDENT_AMBULATORY_CARE_PROVIDER_SITE_OTHER)

## 2024-08-13 ENCOUNTER — Encounter (HOSPITAL_COMMUNITY): Payer: Self-pay

## 2024-08-13 ENCOUNTER — Other Ambulatory Visit (HOSPITAL_COMMUNITY): Payer: Self-pay

## 2024-08-13 ENCOUNTER — Ambulatory Visit (HOSPITAL_COMMUNITY)
Admission: RE | Admit: 2024-08-13 | Discharge: 2024-08-13 | Disposition: A | Discharge status: Home / Self Care | Source: Ambulatory Visit | Attending: Nurse Practitioner | Admitting: Nurse Practitioner

## 2024-08-13 ENCOUNTER — Encounter: Payer: Self-pay | Admitting: Urology

## 2024-08-13 VITALS — BP 124/83 | HR 128 | Temp 99.3°F | Resp 24

## 2024-08-13 DIAGNOSIS — J9601 Acute respiratory failure with hypoxia: Secondary | ICD-10-CM

## 2024-08-13 DIAGNOSIS — J189 Pneumonia, unspecified organism: Secondary | ICD-10-CM | POA: Diagnosis not present

## 2024-08-13 DIAGNOSIS — R051 Acute cough: Secondary | ICD-10-CM

## 2024-08-13 DIAGNOSIS — R0602 Shortness of breath: Secondary | ICD-10-CM

## 2024-08-13 HISTORY — DX: Pneumonia, unspecified organism: J18.9

## 2024-08-13 MED ORDER — IPRATROPIUM-ALBUTEROL 0.5-2.5 (3) MG/3ML IN SOLN
3.0000 mL | Freq: Once | RESPIRATORY_TRACT | Status: AC
  Administered 2024-08-13: 3 mL via RESPIRATORY_TRACT

## 2024-08-13 MED ORDER — AMOXICILLIN-POT CLAVULANATE 875-125 MG PO TABS
1.0000 | ORAL_TABLET | Freq: Two times a day (BID) | ORAL | 0 refills | Status: DC
  Filled 2024-08-13: qty 14, 7d supply, fill #0

## 2024-08-13 MED ORDER — PREDNISONE 10 MG (21) PO TBPK
ORAL_TABLET | Freq: Every day | ORAL | 0 refills | Status: DC
  Filled 2024-08-13: qty 42, 12d supply, fill #0

## 2024-08-13 MED ORDER — CEFTRIAXONE SODIUM 1 G IJ SOLR
1.0000 g | Freq: Once | INTRAMUSCULAR | Status: AC
  Administered 2024-08-13: 1 g via INTRAMUSCULAR

## 2024-08-13 MED ORDER — METHYLPREDNISOLONE SODIUM SUCC 125 MG IJ SOLR
125.0000 mg | Freq: Once | INTRAMUSCULAR | Status: AC
  Administered 2024-08-13: 125 mg via INTRAMUSCULAR

## 2024-08-13 MED ORDER — AMOXICILLIN-POT CLAVULANATE 875-125 MG PO TABS
1.0000 | ORAL_TABLET | Freq: Two times a day (BID) | ORAL | 0 refills | Status: AC
  Filled 2024-08-13: qty 14, 7d supply, fill #0

## 2024-08-13 MED ORDER — CEFTRIAXONE SODIUM 1 G IJ SOLR
INTRAMUSCULAR | Status: AC
  Filled 2024-08-13: qty 10

## 2024-08-13 MED ORDER — DOXYCYCLINE HYCLATE 100 MG PO TABS
100.0000 mg | ORAL_TABLET | Freq: Two times a day (BID) | ORAL | 0 refills | Status: AC
  Filled 2024-08-13: qty 14, 7d supply, fill #0

## 2024-08-13 MED ORDER — DOXYCYCLINE HYCLATE 100 MG PO TABS
100.0000 mg | ORAL_TABLET | Freq: Two times a day (BID) | ORAL | 0 refills | Status: DC
  Filled 2024-08-13: qty 14, 7d supply, fill #0

## 2024-08-13 MED ORDER — LIDOCAINE HCL (PF) 1 % IJ SOLN
INTRAMUSCULAR | Status: AC
Start: 2024-08-13 — End: 2024-08-13
  Filled 2024-08-13: qty 2

## 2024-08-13 MED ORDER — IPRATROPIUM-ALBUTEROL 0.5-2.5 (3) MG/3ML IN SOLN
RESPIRATORY_TRACT | Status: AC
  Filled 2024-08-13: qty 3

## 2024-08-13 MED ORDER — PREDNISONE 10 MG PO TABS
ORAL_TABLET | ORAL | 0 refills | Status: AC
  Filled 2024-08-13: qty 42, 12d supply, fill #0

## 2024-08-13 MED ORDER — METHYLPREDNISOLONE SODIUM SUCC 125 MG IJ SOLR
INTRAMUSCULAR | Status: AC
  Filled 2024-08-13: qty 2

## 2024-08-13 NOTE — ED Notes (Signed)
 AMA form signed and placed in a folder to be scanned in the chart.

## 2024-08-13 NOTE — ED Triage Notes (Addendum)
 Patient reports that she is having a productive cough, but swallows so she doesn't look at it. Patient also c/o SOB x 1 week. Patient reports a history of pneumonia.

## 2024-08-13 NOTE — Discharge Instructions (Signed)
 I recommended evaluation in the emergency room for low oxygen  and pneumonia today, however you declined.  We are treating you as an outpatient for pneumonia today.  Please continue the breathing medicine that you have been prescribed previously.  In addition, we gave you an injection of antibiotic medicine today for pneumonia.  Start taking the Augmentin  and doxycycline  to treat for pneumonia as prescribed.  You can also start taking the increased dose of prednisone .  If symptoms worsen in any way, please call 911 or go directly to the emergency room.

## 2024-08-13 NOTE — ED Notes (Signed)
 O2 rep;laced at 2L/min while in room.

## 2024-08-13 NOTE — Telephone Encounter (Signed)
 Meds sent [facility]

## 2024-08-13 NOTE — ED Provider Notes (Signed)
 [facility]    CSN: [id number] Arrival date & time: 08/13/24  9146      History   Chief Complaint Chief Complaint  Patient presents with   Cough    Entered by patient   Shortness of Breath    HPI Cassandra Lucas is a 48 y.o. female.   Patient presents today with 1 week history of shortness of breath, fever, congested cough, chest pain when she breathes or coughs, fatigue, and weakness.  She states I have pneumonia.  No significant runny or stuffy nose, sore throat, headache, or ear pain.  No nausea, vomiting, or diarrhea.  Patient reports history of COPD, has been taking medication including Combivent Respimat and nebulizer as prescribed.  Reports she last used her rescue inhaler this morning.  She reports her heart rate is always elevated.  Also reports history of rheumatoid arthritis, is on daily prednisone  for this.  She is not currently on oxygen  therapy at home.  She does have a CPAP and has a mask that gives humidified air.    Past Medical History:  Diagnosis Date   Arthritis    COPD (chronic obstructive pulmonary disease) (HCC)    Diabetes mellitus without complication (HCC)    Pneumonia     There are no active problems to display for this patient.   Past Surgical History:  Procedure Laterality Date   ABDOMINAL HYSTERECTOMY     breast cystectomy Right    CESAREAN SECTION     CHOLECYSTECTOMY     TONSILLECTOMY      OB History   No obstetric history on file.      Home Medications    Prior to Admission medications   Medication Sig Start Date End Date Taking? Authorizing Provider  amoxicillin -clavulanate (AUGMENTIN ) 875-125 MG tablet Take 1 tablet by mouth 2 (two) times daily for 7 days. 08/13/24 08/20/24  Chandra Harlene LABOR, NP  doxycycline  (VIBRA -TABS) 100 MG tablet Take 1 tablet (100 mg total) by mouth 2 (two) times daily for 7 days. 08/13/24 08/20/24  Chandra Harlene LABOR, NP  predniSONE  (STERAPRED UNI-PAK 21 TAB) 10 MG (21) TBPK tablet  Take by mouth daily. Take 6 tabs by mouth daily  for 2 days, then 5 tabs for 2 days, then 4 tabs for 2 days, then 3 tabs for 2 days, 2 tabs for 2 days, then 1 tab by mouth daily for 2 days 08/13/24   Chandra Harlene LABOR, NP    Family History Family History  Problem Relation Age of Onset   Diabetes Father    Heart failure Father     Social History Social History   Tobacco Use   Smoking status: Every Day    Types: Cigarettes   Smokeless tobacco: Never  Vaping Use   Vaping status: Never Used  Substance Use Topics   Drug use: Never     Allergies   Aspirin, Flagyl  [metronidazole ], Macrobid [nitrofurantoin], Nsaids, and Other   Review of Systems Review of Systems Per HPI  Physical Exam Triage Vital Signs ED Triage Vitals  Encounter Vitals Group     BP 08/13/24 0910 124/83     Girls Systolic BP Percentile --      Girls Diastolic BP Percentile --      Boys Systolic BP Percentile --      Boys Diastolic BP Percentile --      Pulse Rate 08/13/24 0910 (!) 128     Resp 08/13/24 0910 (!) 24     Temp 08/13/24 0910  99.3 F (37.4 C)     Temp Source 08/13/24 0910 Oral     SpO2 08/13/24 0910 (!) 85 %     Weight --      Height --      Head Circumference --      Peak Flow --      Pain Score 08/13/24 0912 0     Pain Loc --      Pain Education --      Exclude from Growth Chart --    No data found.  Updated Vital Signs BP 124/83 (BP Location: Left Arm)   Pulse (!) 128   Temp 99.3 F (37.4 C) (Oral)   Resp (!) 24   SpO2 (!) 88%   Visual Acuity Right Eye Distance:   Left Eye Distance:   Bilateral Distance:    Right Eye Near:   Left Eye Near:    Bilateral Near:     Physical Exam Vitals and nursing note reviewed.  Constitutional:      General: She is not in acute distress.    Appearance: She is well-developed. She is ill-appearing. She is not diaphoretic.  HENT:     Head: Normocephalic.  Cardiovascular:     Rate and Rhythm: Tachycardia present.  Pulmonary:      Effort: Tachypnea, accessory muscle usage and respiratory distress present.     Breath sounds: Decreased breath sounds, wheezing, rhonchi and rales present.  Musculoskeletal:     Right lower leg: No edema.     Left lower leg: No edema.  Skin:    General: Skin is warm and dry.     Capillary Refill: Capillary refill takes less than 2 seconds.     Coloration: Skin is not cyanotic or pale.     Findings: Erythema (flushed appearing) present.  Neurological:     Mental Status: She is alert and oriented to person, place, and time.  Psychiatric:        Behavior: Behavior is uncooperative.      UC Treatments / Results  Labs (all labs ordered are listed, but only abnormal results are displayed) Labs Reviewed - No data to display  EKG   Radiology DG Chest 2 View Result Date: 08/13/2024 CLINICAL DATA:  acute cough EXAM: CHEST - 2 VIEW COMPARISON:  April 16, 2024 FINDINGS: Interstitial opacities throughout the lungs. Patchy airspace opacities in the right mid and lower lung zones and left upper lobe. No pleural effusion or pneumothorax. No cardiomegaly.No acute fracture or destructive lesion. IMPRESSION: Findings consistent with multifocal pneumonia. No pleural effusion. A 2 view chest radiograph in 6-12 weeks is recommended to document resolution once treatment is complete. Electronically Signed   By: Rogelia Myers M.D.   On: 08/13/2024 10:52    Procedures Procedures (including critical care time)  Medications Ordered in UC Medications  methylPREDNISolone  sodium succinate  (SOLU-MEDROL ) 125 mg/2 mL injection 125 mg (125 mg Intramuscular Given 08/13/24 0926)  ipratropium-albuterol  (DUONEB) 0.5-2.5 (3) MG/3ML nebulizer solution 3 mL (3 mLs Nebulization Given 08/13/24 0929)  cefTRIAXone  (ROCEPHIN ) injection 1 g (1 g Intramuscular Given 08/13/24 1052)    Initial Impression / Assessment and Plan / UC Course  I have reviewed the triage vital signs and the nursing notes.  Pertinent labs &  imaging results that were available during my care of the patient were reviewed by me and considered in my medical decision making (see chart for details).   Initially upon arrival to urgent care, SpO2 is 85% on room air.  4 L nasal cannula was applied and SpO2 increased to 90%.  A DuoNeb was given and SpO2 88% after DuoNeb on room air.  2 L nasal cannula oxygen  reapplied to keep SpO2 above 90%.  Discussed hypoxia with patient and recommended formal evaluation in the emergency room.    Patient declines emergency room evaluation due to upcoming holiday.  Therefore, chest x-ray obtained.  Chest x-ray concerning for right lower lobe pneumonia and possible left upper lobe pneumonia.  Discussed findings with patient, she again continues to decline emergency room evaluation and hospitalization.  I discussed risks of this with her.  Since she is refusing my medical advice, we will treat patient with ceftriaxone  1 g IM in urgent care today, Augmentin  twice daily for 7 days as well as doxycycline  twice a day for 7 days and high-dose prednisone .  Strict ER precautions were discussed with the patient if symptoms worsen in any way she is to go directly to the emergency room or call 911.  The patient has been advised that they should be evaluated in the emergency department due to concern for hypoxia and pneumonia .   I explained that the emergency room can provide resources not available in the urgent care. They have refused evaluation in the emergency room and declined referral or arrangement of transportation to the emergency room at this time and want to be discharged.  The patient was treated to the extent that they would allow.  The patient had the opportunity to ask questions about their medical condition.   The benefits of seeking and risks of not seeking emergency room evaluation were discussed and explained to the patient including but not limited to, worsening of their condition, chronic pain, permanent  disability, and death. The patient has decision-making capacity and voiced that they understand the information and the potential consequences of leaving Against Medical Advice. The patient did complete and sign an AMA form which was witnessed by Holli Gentle, RN .  Final Clinical Impressions(s) / UC Diagnoses   Final diagnoses:  Acute cough  Pneumonia of right lower lobe due to infectious organism  Acute respiratory failure with hypoxia [facility])     Discharge Instructions      I recommended evaluation in the emergency room for low oxygen  and pneumonia today, however you declined.  We are treating you as an outpatient for pneumonia today.  Please continue the breathing medicine that you have been prescribed previously.  In addition, we gave you an injection of antibiotic medicine today for pneumonia.  Start taking the Augmentin  and doxycycline  to treat for pneumonia as prescribed.  You can also start taking the increased dose of prednisone .  If symptoms worsen in any way, please call 911 or go directly to the emergency room.     ED Prescriptions     Medication Sig Dispense Auth. Provider   predniSONE  (STERAPRED UNI-PAK 21 TAB) 10 MG (21) TBPK tablet Take by mouth daily. Take 6 tabs by mouth daily  for 2 days, then 5 tabs for 2 days, then 4 tabs for 2 days, then 3 tabs for 2 days, 2 tabs for 2 days, then 1 tab by mouth daily for 2 days 42 tablet Chandra Raisin A, NP   amoxicillin -clavulanate (AUGMENTIN ) 875-125 MG tablet Take 1 tablet by mouth 2 (two) times daily for 7 days. 14 tablet Chandra Raisin A, NP   doxycycline  (VIBRA -TABS) 100 MG tablet Take 1 tablet (100 mg total) by mouth 2 (two) times daily for 7 days.  14 tablet Chandra Harlene LABOR, NP      PDMP not reviewed this encounter.   Chandra Harlene LABOR, NP 08/13/24 1114

## 2024-08-18 ENCOUNTER — Ambulatory Visit (HOSPITAL_COMMUNITY)
Admission: RE | Admit: 2024-08-18 | Discharge: 2024-08-18 | Disposition: A | Source: Ambulatory Visit | Attending: Urology | Admitting: Urology

## 2024-08-18 DIAGNOSIS — N201 Calculus of ureter: Secondary | ICD-10-CM | POA: Insufficient documentation

## 2024-08-25 ENCOUNTER — Ambulatory Visit: Payer: Self-pay | Admitting: Urology

## 2024-09-12 ENCOUNTER — Ambulatory Visit: Admission: EM | Admit: 2024-09-12 | Discharge: 2024-09-12 | Disposition: A | Discharge status: Home / Self Care

## 2024-09-12 ENCOUNTER — Ambulatory Visit

## 2024-09-12 DIAGNOSIS — R051 Acute cough: Secondary | ICD-10-CM

## 2024-09-12 DIAGNOSIS — J22 Unspecified acute lower respiratory infection: Secondary | ICD-10-CM

## 2024-09-12 DIAGNOSIS — J4541 Moderate persistent asthma with (acute) exacerbation: Secondary | ICD-10-CM

## 2024-09-12 MED ORDER — DOXYCYCLINE HYCLATE 100 MG PO CAPS: 100.0000 mg | ORAL_CAPSULE | Freq: Two times a day (BID) | ORAL | 0 refills | Status: DC

## 2024-09-12 MED ORDER — PROMETHAZINE-DM 6.25-15 MG/5ML PO SYRP: 5.0000 mL | ORAL_SOLUTION | Freq: Four times a day (QID) | ORAL | 0 refills | Status: DC | PRN

## 2024-09-12 MED ORDER — PREDNISONE 20 MG PO TABS: 40.0000 mg | ORAL_TABLET | Freq: Every day | ORAL | 0 refills | Status: DC

## 2024-09-12 MED ORDER — DEXAMETHASONE SOD PHOSPHATE PF 10 MG/ML IJ SOLN
10.0000 mg | Freq: Once | INTRAMUSCULAR | Status: AC
  Administered 2024-09-12: 10 mg via INTRAMUSCULAR

## 2024-09-12 NOTE — ED Triage Notes (Signed)
 Pt reports she has chest pain, upper back pain, SHOB, and congestion x 3 days

## 2024-09-12 NOTE — Discharge Instructions (Signed)
 We will let you know if your chest x-ray report shows anything that we did not already discussed.  Try the prescribed medications in addition to your inhaler regimen and over-the-counter remedies.  Follow-up for worsening or unresolving symptoms.

## 2024-09-14 NOTE — ED Provider Notes (Signed)
 " [facility]    CSN: [id number] Arrival date & time: 09/12/24  1811      History   Chief Complaint No chief complaint on file.   HPI Cassandra Lucas is a 48 y.o. female.   Patient presenting today with 3-day history of progressively worsening chest pain, upper back pain, shortness of breath, congested productive cough, wheezing.  Denies fever, chills, body aches, abdominal pain, vomiting, diarrhea.  History of COPD and asthma compliant with inhaler regimen and breathing treatments but notes she is prone to hospitalizations from pneumonia with the symptoms.  So far trying over-the-counter cold and congestion medications in addition to her inhaler regimen with minimal relief and symptoms continue to worsen.    Past Medical History:  Diagnosis Date   Anemia    Anxiety    Arthritis    Asthma    Carpal tunnel syndrome, bilateral 11/22/2015   Will try wrist splints   Community acquired pneumonia    COPD (chronic obstructive pulmonary disease) (HCC)    Decreased libido 03/26/2015   Depression    Diabetes mellitus without complication (HCC)    prediabetes   Diabetes mellitus without complication (HCC)    Dyslipidemia 11/24/2015   Dysrhythmia    Empty sella    Fibromyalgia    Fibromyalgia    GERD (gastroesophageal reflux disease)    History of kidney stones    Hot flashes 03/26/2015   Hyperlipidemia    Kidney stones    Pneumonia    Productive cough    smoker   Renal disorder    Vitamin D  deficiency 11/24/2015    Patient Active Problem List   Diagnosis Date Noted   Type 2 diabetes mellitus without complication, without long-term current use of insulin  (HCC) 04/17/2024   Acute cystitis without hematuria 04/16/2024   Ureterolithiasis 04/15/2024   Lower urinary tract infectious disease 04/15/2024   Ureteric stone 04/15/2024   COPD with acute exacerbation (HCC) 04/15/2024   Tachycardia 04/15/2024   Hydronephrosis 04/15/2024   Asthma 07/31/2021    Fibromyalgia 06/23/2020   High risk medication use 06/23/2020   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    ABDOMINAL HYSTERECTOMY     BREAST CYST EXCISION     breast cystectomy Right    CESAREAN SECTION  2006   [facility]   CESAREAN SECTION     CHOLECYSTECTOMY     CYSTOSCOPY WITH RETROGRADE PYELOGRAM, URETEROSCOPY AND STENT PLACEMENT Left 04/15/2024   Procedure: CYSTOSCOPY WITH RETROGRADE PYELOGRAM;  Surgeon: Roseann Adine PARAS., MD;  Location: [facility];  Service: Urology;  Laterality: Left;   CYSTOSCOPY/URETEROSCOPY/HOLMIUM LASER/STENT PLACEMENT Left 06/24/2024   Procedure: CYSTOSCOPY/URETEROSCOPY/HOLMIUM LASER/STENT PLACEMENT;  Surgeon: Roseann Adine PARAS., MD;  Location: [facility];  Service: Urology;  Laterality: Left;   IR NEPHROSTOMY PLACEMENT LEFT  04/15/2024   IR URETERAL STENT PLACEMENT EXISTING ACCESS LEFT  05/21/2024   LAPAROSCOPIC CHOLECYSTECTOMY  2002   [facility]   LAPAROSCOPIC HYSTERECTOMY  04/12/2011   Procedure: HYSTERECTOMY TOTAL LAPAROSCOPIC;  Surgeon: Vonn VEAR Inch, MD;  Location: [facility];  Service: Gynecology;  Laterality: N/A;   THYROIDECTOMY, PARTIAL  2012   [facility], [facility]   TONSILLECTOMY      OB History     Gravida  4   Para  4   Term  0   Preterm  0   AB  0   Living         SAB  0   IAB  0   Ectopic  0   Multiple      Live Births               Home Medications    Prior to  Admission medications  Medication Sig Start Date End Date Taking? Authorizing Provider  doxycycline  (VIBRAMYCIN ) 100 MG capsule Take 1 capsule (100 mg total) by mouth 2 (two) times daily. 09/12/24  Yes Stuart Vernell Norris, PA-C  oxyCODONE -acetaminophen  (PERCOCET) 10-325 MG tablet Take 1 tablet by mouth 4 (four) times daily as needed. 08/29/24  Yes [provider]  predniSONE  (DELTASONE ) 20 MG tablet Take 2 tablets (40 mg total) by mouth daily with breakfast. 09/12/24  Yes Stuart Vernell Norris, PA-C  promethazine -dextromethorphan (PROMETHAZINE -DM) 6.25-15 MG/5ML syrup Take 5 mLs by mouth 4 (four) times daily as needed. 09/12/24  Yes Stuart Vernell Norris, PA-C  albuterol  (PROVENTIL ) (2.5 MG/3ML) 0.083% nebulizer solution Take 2.5 mg by nebulization every 6 (six) hours as needed for wheezing or shortness of breath.    [provider]  albuterol  (VENTOLIN  HFA) 108 (90 Base) MCG/ACT inhaler Inhale 1-2 puffs into the lungs every 6 (six) hours as needed for wheezing or shortness of breath. 07/31/21   Stuart Vernell Norris, PA-C  alendronate (FOSAMAX) 70 MG tablet Take 70 mg by mouth once a week. 09/15/22   [provider]  BELBUCA  150 MCG FILM Take 150 mcg by mouth 2 (two) times daily as needed (pain). 12/07/23   [provider]  budeson-glycopyrrolate -formoterol (BREZTRI  AEROSPHERE) 160-9-4.8 MCG/ACT AERO Inhale 2 puffs into the lungs in the morning and at bedtime. 12/11/23   Neysa Reggy BIRCH, MD  cetirizine  (ZYRTEC  ALLERGY) 10 MG tablet Take 1 tablet (10 mg total) by mouth daily. 07/31/21   Stuart Vernell Norris, PA-C  cyclobenzaprine  (FLEXERIL ) 10 MG tablet Take 10 mg by mouth every evening. 07/22/21   [provider]  famotidine  (PEPCID ) 40 MG tablet Take 40 mg by mouth daily. 11/28/23   [provider]  glipiZIDE (GLUCOTROL) 10 MG tablet Take 10 mg by mouth daily. 11/05/23   [provider]  hydroxychloroquine  (PLAQUENIL ) 200 MG tablet  Take 400 mg by mouth daily.    [provider]  Ipratropium-Albuterol  (COMBIVENT RESPIMAT) 20-100 MCG/ACT AERS respimat Inhale 1 puff into the lungs every 6 (six) hours as needed for wheezing or shortness of breath. 10/23/18   [provider]  leflunomide  (ARAVA ) 10 MG tablet Take 1 tablet (10 mg total) by mouth daily. 04/22/24   Sebastian Toribio GAILS, MD  levalbuterol  (XOPENEX ) 0.63 MG/3ML nebulizer solution Take 3 mLs (0.63 mg total) by nebulization 3 (three) times daily for 3 days, THEN 3 mLs (0.63 mg total) every 6 (six) hours as needed. 04/17/24 04/20/25  Sebastian Toribio GAILS, MD  Multiple Vitamins-Minerals ([employer] ADULT MULTIVITAMIN) CHEW Chew 2 tablets by mouth daily.    [provider]  OHTUVAYRE  3 MG/2.5ML SUSP Inhale 1 packet into the lungs in  the morning and at bedtime. 05/25/24   [provider]  OZEMPIC, 2 MG/DOSE, 8 MG/3ML SOPN Inject 2 mg into the skin once a week. 11/13/23   [provider]  predniSONE  (DELTASONE ) 5 MG tablet Take 5 mg by mouth daily with breakfast.    [provider]  pregabalin  (LYRICA ) 200 MG capsule Take 200 mg by mouth 3 (three) times daily. 03/22/24   [provider]  propafenone (RYTHMOL) 150 MG tablet Take 150 mg by mouth 3 (three) times daily. 03/17/24   [provider]  simvastatin (ZOCOR) 20 MG tablet Take 20 mg by mouth every evening. 02/06/24   [provider]    Family History Family History  Problem Relation Age of Onset   Diabetes Father    Heart failure Father    Breast cancer Mother    Migraines Sister    Scoliosis Daughter    ADD / ADHD Son    Cancer Maternal Grandfather        pancreatic   Diabetes Paternal Grandmother    Congestive Heart Failure Paternal Grandmother    ADD / ADHD Daughter    Scoliosis Daughter    Hyperlipidemia Daughter    Diabetes Daughter        prediabetes   Bipolar disorder Son    ADD / ADHD Son     Social History Social History[1]   Allergies    Nsaids, Flagyl  [metronidazole ], Macrobid [nitrofurantoin macrocrystal], Aspirin, Flagyl  [metronidazole ], Macrobid [nitrofurantoin], Nsaids, Other, and Aspirin   Review of Systems Review of Systems Per HPI  Physical Exam Triage Vital Signs ED Triage Vitals  Encounter Vitals Group     BP 09/12/24 1856 129/89     Girls Systolic BP Percentile --      Girls Diastolic BP Percentile --      Boys Systolic BP Percentile --      Boys Diastolic BP Percentile --      Pulse Rate 09/12/24 1856 (!) 122     Resp 09/12/24 1856 20     Temp 09/12/24 1856 98.2 F (36.8 C)     Temp Source 09/12/24 1856 Oral     SpO2 09/12/24 1856 94 %     Weight --      Height --      Head Circumference --      Peak Flow --      Pain Score 09/12/24 1857 0     Pain Loc --      Pain Education --      Exclude from Growth Chart --    No data found.  Updated Vital Signs BP 129/89 (BP Location: Right Arm)   Pulse (!) 122   Temp 98.2 F (36.8 C) (Oral)   Resp 20   LMP 01/22/2011   SpO2 94%   Visual Acuity Right Eye Distance:   Left Eye Distance:   Bilateral Distance:    Right Eye Near:   Left Eye Near:    Bilateral Near:     Physical Exam Vitals and nursing note reviewed.  Constitutional:      Appearance: Normal appearance.  HENT:     Head: Atraumatic.     Right Ear: Tympanic membrane and external ear normal.     Left Ear: Tympanic membrane and external ear normal.     Nose: Congestion present.     Mouth/Throat:     Mouth: Mucous membranes are moist.     Pharynx: Posterior oropharyngeal erythema present.  Eyes:  Extraocular Movements: Extraocular movements intact.     Conjunctiva/sclera: Conjunctivae normal.  Cardiovascular:     Rate and Rhythm: Normal rate and regular rhythm.     Heart sounds: Normal heart sounds.  Pulmonary:     Effort: Pulmonary effort is normal.     Breath sounds: Wheezing and rales present.  Musculoskeletal:        General: Normal range of motion.     Cervical  back: Normal range of motion and neck supple.  Skin:    General: Skin is warm and dry.  Neurological:     Mental Status: She is alert and oriented to person, place, and time.  Psychiatric:        Mood and Affect: Mood normal.        Thought Content: Thought content normal.      UC Treatments / Results  Labs (all labs ordered are listed, but only abnormal results are displayed) Labs Reviewed - No data to display  EKG   Radiology DG Chest 2 View Result Date: 09/12/2024 EXAM: 2 VIEW(S) XRAY OF THE CHEST 09/12/2024 07:12:12 PM COMPARISON: Chest X-ray 08/13/2024, CT chest 10/09/2021. CLINICAL HISTORY: cough, SOB x 3 days FINDINGS: LUNGS AND PLEURA: Interval resolution of diffuse patchy opacities. Chronic coarsened interstitial markings without pulmonary edema. No pleural effusion. No pneumothorax. HEART AND MEDIASTINUM: No acute abnormality of the cardiac and mediastinal silhouettes. BONES AND SOFT TISSUES: Redemonstration of a mid thoracic vertebral body anterior compression fracture. IMPRESSION: 1. Interval resolution of diffuse patchy opacities. 2. No acute findings. Electronically signed by: Morgane Naveau MD 09/12/2024 09:00 PM EST RP Workstation: [id number]    Procedures Procedures (including critical care time)  Medications Ordered in UC Medications  dexamethasone  (DECADRON ) injection 10 mg (10 mg Intramuscular Given 09/12/24 1954)    Initial Impression / Assessment and Plan / UC Course  I have reviewed the triage vital signs and the nursing notes.  Pertinent labs & imaging results that were available during my care of the patient were reviewed by me and considered in my medical decision making (see chart for details).     Patient is tachycardic in triage, otherwise vital signs within normal limits.  She has wheezes and coarse crackles on exam, severe asthma and COPD with multiple hospitalizations in the past for similar symptoms.  Given worsening course, and her underlying  conditions will treat with IM Decadron , doxycycline , prednisone , Phenergan  DM in addition to her inhaler regimen and over-the-counter remedies.  Return for worsening or unresolving symptoms.  Chest x-ray today without evidence of current pneumonia.  Final Clinical Impressions(s) / UC Diagnoses   Final diagnoses:  Acute cough  Lower respiratory infection  Moderate persistent asthma with acute exacerbation     Discharge Instructions      We will let you know if your chest x-ray report shows anything that we did not already discussed.  Try the prescribed medications in addition to your inhaler regimen and over-the-counter remedies.  Follow-up for worsening or unresolving symptoms.    ED Prescriptions     Medication Sig Dispense Auth. Provider   predniSONE  (DELTASONE ) 20 MG tablet Take 2 tablets (40 mg total) by mouth daily with breakfast. 10 tablet Stuart Vernell Norris, PA-C   doxycycline  (VIBRAMYCIN ) 100 MG capsule Take 1 capsule (100 mg total) by mouth 2 (two) times daily. 20 capsule Stuart Vernell Norris, PA-C   promethazine -dextromethorphan (PROMETHAZINE -DM) 6.25-15 MG/5ML syrup Take 5 mLs by mouth 4 (four) times daily as needed. 100 mL Stuart Vernell Norris, PA-C  PDMP not reviewed this encounter.    [1]  Social History Tobacco Use   Smoking status: Every Day    Current packs/day: 0.25    Average packs/day: 0.3 packs/day for 17.0 years (4.3 ttl pk-yrs)    Types: Cigarettes   Smokeless tobacco: Never  Vaping Use   Vaping status: Never Used  Substance Use Topics   Alcohol use: No   Drug use: Never     Stuart Vernell Norris, PA-C 09/14/24 1222  "

## 2024-09-15 ENCOUNTER — Ambulatory Visit (HOSPITAL_COMMUNITY): Payer: Self-pay

## 2024-10-08 ENCOUNTER — Inpatient Hospital Stay

## 2024-10-08 ENCOUNTER — Inpatient Hospital Stay: Attending: Family | Admitting: Family

## 2024-10-08 ENCOUNTER — Other Ambulatory Visit: Payer: Self-pay

## 2024-10-08 VITALS — BP 133/90 | HR 121 | Temp 99.4°F | Resp 19 | Ht <= 58 in | Wt 179.0 lb

## 2024-10-08 DIAGNOSIS — D72829 Elevated white blood cell count, unspecified: Secondary | ICD-10-CM

## 2024-10-08 DIAGNOSIS — F17210 Nicotine dependence, cigarettes, uncomplicated: Secondary | ICD-10-CM | POA: Diagnosis not present

## 2024-10-08 DIAGNOSIS — Z803 Family history of malignant neoplasm of breast: Secondary | ICD-10-CM | POA: Insufficient documentation

## 2024-10-08 DIAGNOSIS — E611 Iron deficiency: Secondary | ICD-10-CM

## 2024-10-08 LAB — COMPREHENSIVE METABOLIC PANEL WITH GFR
ALT: 16 U/L (ref 0–44)
AST: 18 U/L (ref 15–41)
Albumin: 4.2 g/dL (ref 3.5–5.0)
Alkaline Phosphatase: 67 U/L (ref 38–126)
Anion gap: 13 (ref 5–15)
BUN: 9 mg/dL (ref 6–20)
CO2: 26 mmol/L (ref 22–32)
Calcium: 10.5 mg/dL — ABNORMAL HIGH (ref 8.9–10.3)
Chloride: 104 mmol/L (ref 98–111)
Creatinine, Ser: 0.78 mg/dL (ref 0.44–1.00)
GFR, Estimated: >60 mL/min
Glucose, Bld: 134 mg/dL — ABNORMAL HIGH (ref 70–99)
Potassium: 4.3 mmol/L (ref 3.5–5.1)
Sodium: 142 mmol/L (ref 135–145)
Total Bilirubin: 0.2 mg/dL (ref 0.0–1.2)
Total Protein: 6.4 g/dL — ABNORMAL LOW (ref 6.5–8.1)

## 2024-10-08 LAB — CBC WITH DIFFERENTIAL/PLATELET
Abs Immature Granulocytes: 0.03 K/uL (ref 0.00–0.07)
Basophils Absolute: 0 K/uL (ref 0.0–0.1)
Basophils Relative: 1 %
Eosinophils Absolute: 0.1 K/uL (ref 0.0–0.5)
Eosinophils Relative: 2 %
HCT: 41.5 % (ref 36.0–46.0)
Hemoglobin: 13.7 g/dL (ref 12.0–15.0)
Immature Granulocytes: 0 %
Lymphocytes Relative: 20 %
Lymphs Abs: 1.5 K/uL (ref 0.7–4.0)
MCH: 31 pg (ref 26.0–34.0)
MCHC: 33 g/dL (ref 30.0–36.0)
MCV: 93.9 fL (ref 80.0–100.0)
Monocytes Absolute: 0.5 K/uL (ref 0.1–1.0)
Monocytes Relative: 6 %
Neutro Abs: 5.6 K/uL (ref 1.7–7.7)
Neutrophils Relative %: 71 %
Platelets: 224 K/uL (ref 150–400)
RBC: 4.42 MIL/uL (ref 3.87–5.11)
RDW: 13.2 % (ref 11.5–15.5)
WBC: 7.8 K/uL (ref 4.0–10.5)
nRBC: 0 % (ref 0.0–0.2)

## 2024-10-08 LAB — TECHNOLOGIST SMEAR REVIEW
Clinical Information: ABNORMAL
Plt Morphology: NORMAL

## 2024-10-08 LAB — FERRITIN: Ferritin: 77 ng/mL (ref 11–307)

## 2024-10-08 LAB — LACTATE DEHYDROGENASE: LDH: 190 U/L (ref 105–235)

## 2024-10-08 LAB — IRON AND TIBC
Iron: 63 ug/dL (ref 28–170)
Saturation Ratios: 18 % (ref 10.4–31.8)
TIBC: 357 ug/dL (ref 250–450)
UIBC: 294 ug/dL

## 2024-10-08 NOTE — Progress Notes (Signed)
 Hematology/Oncology Consultation   Name: Cassandra Lucas      MRN: [id number]    Location: Room/bed info not found  Date: 10/08/2024 Time:11:24 AM   REFERRING PHYSICIAN:  Heron Jama Fear, PA-C  REASON FOR CONSULT:  Leukocytosis    DIAGNOSIS: Mild Leukocytosis   HISTORY OF PRESENT ILLNESS: Cassandra Lucas is present at [facility] and I am present at [facility] for her Mychart video visit. She is a pleasant 49 yo caucasian female with multiple health issues. She has history of mild intermittent leukocytosis over at least the last 14 years.  She states that she is prone to getting frequent bouts with pneumonia and UTIs.  When she is on antibiotics and steroids for the PNA she has uncontrolled blood sugars as a result.  She is currently on Ozempic injection weekly and then will add Glipizide when she is on steroids for infection. She states that her most recent Hgb A1c was 6.2.  She states that she only eats one meal a day. She experiences nausea regularly. Poor appetite but does her best to stay well hydrated. Weight is 179 lbs.  She has RA and is on low dose prednisone  as well as Plaquenil .  She has chronic joint and back pain with this.  She states that she had kidney stones and severe infection as a result back in July and again in October. Both times she required hospitalization and treatment.  She has chronic fatigue, weakness, dizziness, SOB with exertion(COPD), occasional palpitations, brain fog, hot flashes, night sweats and feeling tight in her hands and feet.  She has noticed submandibular adenopathy. No adenopathy noted elsewhere by patient.  She notes ruddy complexion in cheeks as well as her arms.  She has sleep apnea and recently started using her home CPAP. This has been an adjustment but she is sleeping 5-7 hrs a night.  No personal history of cancer. Her maternal grandfather had pancreatic and paternal grandfather had an unknown primary.  She is overdue for mammogram and was  recently contacted to schedule.  She has not required early EGD or colonoscopy so far.  She had a total hysterectomy in 2012.  She had a partial thyroidectomy due to goiter.  She does feel that with cuts and infections she has delayed healing.  No fever, chills, cough, chest pain, abdominal pain or changes in bowel or bladder habits.  She has numbness and tingling in the hands and feet. She states that there is history of carpal tunnel in the hands and she will be having a nerve conduction test to assess for diabetic neuropathy.  No falls or syncope reported.  She smokes 1 ppd. No ETOH or recreational drug use.  She had to stop working several years ago due to her health struggles.   ROS: All other 10 point review of systems is negative.   PAST MEDICAL HISTORY:   Past Medical History:  Diagnosis Date   Anemia    Anxiety    Arthritis    Asthma    Carpal tunnel syndrome, bilateral 11/22/2015   Will try wrist splints   Community acquired pneumonia    COPD (chronic obstructive pulmonary disease) (HCC)    Decreased libido 03/26/2015   Depression    Diabetes mellitus without complication (HCC)    prediabetes   Diabetes mellitus without complication (HCC)    Dyslipidemia 11/24/2015   Dysrhythmia    Empty sella    Fibromyalgia    Fibromyalgia    GERD (gastroesophageal reflux disease)  History of kidney stones    Hot flashes 03/26/2015   Hyperlipidemia    Kidney stones    Pneumonia    Productive cough    smoker   Renal disorder    Vitamin D  deficiency 11/24/2015    ALLERGIES: Allergies[1]    MEDICATIONS:  Medications Ordered Prior to Encounter[2]   PAST SURGICAL HISTORY Past Surgical History:  Procedure Laterality Date   ABDOMINAL HYSTERECTOMY     has ovaries.    ABDOMINAL HYSTERECTOMY     BREAST CYST EXCISION     breast cystectomy Right    CESAREAN SECTION  2006   [facility]   CESAREAN SECTION     CHOLECYSTECTOMY     CYSTOSCOPY WITH RETROGRADE PYELOGRAM,  URETEROSCOPY AND STENT PLACEMENT Left 04/15/2024   Procedure: CYSTOSCOPY WITH RETROGRADE PYELOGRAM;  Surgeon: Roseann Adine PARAS., MD;  Location: [facility];  Service: Urology;  Laterality: Left;   CYSTOSCOPY/URETEROSCOPY/HOLMIUM LASER/STENT PLACEMENT Left 06/24/2024   Procedure: CYSTOSCOPY/URETEROSCOPY/HOLMIUM LASER/STENT PLACEMENT;  Surgeon: Roseann Adine PARAS., MD;  Location: [facility];  Service: Urology;  Laterality: Left;   IR NEPHROSTOMY PLACEMENT LEFT  04/15/2024   IR URETERAL STENT PLACEMENT EXISTING ACCESS LEFT  05/21/2024   LAPAROSCOPIC CHOLECYSTECTOMY  2002   [facility]   LAPAROSCOPIC HYSTERECTOMY  04/12/2011   Procedure: HYSTERECTOMY TOTAL LAPAROSCOPIC;  Surgeon: Vonn VEAR Inch, MD;  Location: [facility];  Service: Gynecology;  Laterality: N/A;   THYROIDECTOMY, PARTIAL  2012   [facility]   TONSILLECTOMY      FAMILY HISTORY: Family History  Problem Relation Age of Onset   Diabetes Father    Heart failure Father    Breast cancer Mother    Migraines Sister    Scoliosis Daughter    ADD / ADHD Son    Cancer Maternal Grandfather        pancreatic   Diabetes Paternal Grandmother    Congestive Heart Failure Paternal Grandmother    ADD / ADHD Daughter    Scoliosis Daughter    Hyperlipidemia Daughter    Diabetes Daughter        prediabetes   Bipolar disorder Son    ADD / ADHD Son     SOCIAL HISTORY:  reports that she has been smoking cigarettes. She has a 4.3 pack-year smoking history. She has never used smokeless tobacco. She reports that she does not drink alcohol and does not use drugs.  PERFORMANCE STATUS: The patient's performance status is 1 - Symptomatic but completely ambulatory  PHYSICAL EXAM: Most Recent Vital Signs: Blood pressure (!) 133/90, pulse (!) 121, temperature 99.4 F (37.4 C), temperature source Tympanic, resp. rate 19, height 4' 10 (1.473 m), weight 179 lb (81.2 kg), last menstrual period 01/22/2011, SpO2 96%. BP (!) 133/90 (BP Location: Right Arm, Patient  Position: Sitting, Cuff Size: Normal)   Pulse (!) 121   Temp 99.4 F (37.4 C) (Tympanic)   Resp 19   Ht 4' 10 (1.473 m)   Wt 179 lb (81.2 kg)   LMP 01/22/2011   SpO2 96%   BMI 37.41 kg/m   General Appearance:    Alert, cooperative, no distress, appears stated age  Head:    Normocephalic, without obvious abnormality, atraumatic  Eyes:    PERRL, conjunctiva/corneas clear, EOM's intact, fundi    benign, both eyes        Throat:   Lips, mucosa, and tongue normal; teeth and gums normal  Neck:   Supple, symmetrical, trachea midline, no adenopathy;    thyroid :  no enlargement/tenderness/nodules;  no carotid   bruit or JVD  Back:     Symmetric, no curvature, ROM normal, no CVA tenderness  Lungs:     Clear to auscultation bilaterally, respirations unlabored  Chest Wall:    No tenderness or deformity   Heart:    Regular rate and rhythm, S1 and S2 normal, no murmur, rub   or gallop     Abdomen:     Soft, non-tender, bowel sounds active all four quadrants,    no masses, no organomegaly        Extremities:   Extremities normal, atraumatic, no cyanosis or edema  Pulses:   2+ and symmetric all extremities  Skin:   Skin color, texture, turgor normal, no rashes or lesions  Lymph nodes:   Cervical, supraclavicular, and axillary nodes normal  Neurologic:   CNII-XII intact, normal strength, sensation and reflexes    throughout    LABORATORY DATA:  No results found for this or any previous visit (from the past 48 hours).    RADIOGRAPHY: No results found.     PATHOLOGY: None   ASSESSMENT/PLAN: Ms. Foulkes is a pleasant 49 yo caucasian female with multiple health issues. She has history of mild intermittent leukocytosis over at least the last 14 years.  She is symptomatic as mentioned above.  We will do work up for possible myeloproliferative disease as well as iron studies since she her appetite is poor. Follow-up and treatment plan pending these results.    All questions were  answered. The patient knows to call the clinic with any problems, questions or concerns. We can certainly see the patient much sooner if necessary.  The patient was discussed with Dr. Timmy and he is in agreement with the aforementioned.   Cassandra Pepper, NP           [1]  Allergies Allergen Reactions   Nsaids Shortness Of Breath   Flagyl  [Metronidazole ] Other (See Comments)    Patient is not sure what happens    Macrobid [Nitrofurantoin Macrocrystal] Nausea And Vomiting   Aspirin    Flagyl  [Metronidazole ]    Macrobid [Nitrofurantoin]    Nsaids    Other     Mold   Aspirin Other (See Comments)    Unknown childhood reaction  [2]  Current Outpatient Medications on File Prior to Visit  Medication Sig Dispense Refill   albuterol  (PROVENTIL ) (2.5 MG/3ML) 0.083% nebulizer solution Take 2.5 mg by nebulization every 6 (six) hours as needed for wheezing or shortness of breath.     albuterol  (VENTOLIN  HFA) 108 (90 Base) MCG/ACT inhaler Inhale 1-2 puffs into the lungs every 6 (six) hours as needed for wheezing or shortness of breath. 18 g 0   alendronate (FOSAMAX) 70 MG tablet Take 70 mg by mouth once a week.     cetirizine  (ZYRTEC  ALLERGY) 10 MG tablet Take 1 tablet (10 mg total) by mouth daily. 30 tablet 2   cyclobenzaprine  (FLEXERIL ) 10 MG tablet Take 10 mg by mouth every evening.     famotidine  (PEPCID ) 40 MG tablet Take 40 mg by mouth daily.     glipiZIDE (GLUCOTROL) 10 MG tablet Take 10 mg by mouth daily.     hydroxychloroquine  (PLAQUENIL ) 200 MG tablet Take 400 mg by mouth daily.     Ipratropium-Albuterol  (COMBIVENT RESPIMAT) 20-100 MCG/ACT AERS respimat Inhale 1 puff into the lungs every 6 (six) hours as needed for wheezing or shortness of breath.     Multiple Vitamins-Minerals ([employer] ADULT MULTIVITAMIN) CHEW Chew 2 tablets  by mouth daily.     OHTUVAYRE  3 MG/2.5ML SUSP Inhale 1 packet into the lungs in the morning and at bedtime.     oxyCODONE -acetaminophen  (PERCOCET) 10-325 MG  tablet Take 1 tablet by mouth 4 (four) times daily as needed.     OZEMPIC, 2 MG/DOSE, 8 MG/3ML SOPN Inject 2 mg into the skin once a week.     pregabalin  (LYRICA ) 200 MG capsule Take 200 mg by mouth 3 (three) times daily.     propafenone (RYTHMOL SR) 325 MG 12 hr capsule Take 325 mg by mouth every 12 (twelve) hours.     simvastatin (ZOCOR) 20 MG tablet Take 20 mg by mouth every evening.     No current facility-administered medications on file prior to visit.

## 2024-10-11 LAB — MPN/CML FISH

## 2024-10-15 ENCOUNTER — Telehealth: Payer: Self-pay | Admitting: Family

## 2024-10-15 ENCOUNTER — Ambulatory Visit: Admitting: Podiatry

## 2024-10-15 NOTE — Telephone Encounter (Signed)
 I was able to speak with the patient and go over all her recent lab results with our office. MPN pane came back normal. No abnormality noted on blood smear with technologist review. Iron studies and LDH were also normal. Leukocytosis is felt to be reactive in nature with history of smoking and pneumonia. No follow-up indicated with our office at this time. Patient will continue to follow-up with PCP and rheumatology. No questions at this time. Patient appreciative of call.

## 2024-10-16 LAB — SAVE SMEAR(SSMR), FOR PROVIDER SLIDE REVIEW

## 2024-12-03 ENCOUNTER — Ambulatory Visit: Admitting: Podiatry
